# Patient Record
Sex: Male | Born: 1935 | Race: Black or African American | Hispanic: No | Smoking: Former smoker
Health system: Southern US, Community
[De-identification: ages and names within clinical notes are randomized; demographics above are authoritative.]

## PROBLEM LIST (undated history)

## (undated) DIAGNOSIS — I10 Essential (primary) hypertension: Secondary | ICD-10-CM

## (undated) DIAGNOSIS — M109 Gout, unspecified: Secondary | ICD-10-CM

---

## 2009-05-18 ENCOUNTER — Ambulatory Visit (HOSPITAL_COMMUNITY): Admission: RE | Admit: 2009-05-18 | Discharge: 2009-05-18 | Payer: Self-pay | Admitting: Pulmonary Disease

## 2013-05-16 ENCOUNTER — Inpatient Hospital Stay (HOSPITAL_COMMUNITY): Payer: Medicare Other

## 2013-05-16 ENCOUNTER — Emergency Department (HOSPITAL_COMMUNITY): Payer: Medicare Other

## 2013-05-16 ENCOUNTER — Inpatient Hospital Stay (HOSPITAL_COMMUNITY)
Admission: EM | Admit: 2013-05-16 | Discharge: 2013-05-21 | DRG: 683 | Disposition: A | Discharge status: Home / Self Care | Payer: Medicare Other | Attending: Internal Medicine | Admitting: Internal Medicine

## 2013-05-16 ENCOUNTER — Encounter (HOSPITAL_COMMUNITY): Payer: Self-pay | Admitting: *Deleted

## 2013-05-16 DIAGNOSIS — D61818 Other pancytopenia: Secondary | ICD-10-CM | POA: Diagnosis present

## 2013-05-16 DIAGNOSIS — N138 Other obstructive and reflux uropathy: Secondary | ICD-10-CM | POA: Diagnosis present

## 2013-05-16 DIAGNOSIS — M25571 Pain in right ankle and joints of right foot: Secondary | ICD-10-CM

## 2013-05-16 DIAGNOSIS — N185 Chronic kidney disease, stage 5: Secondary | ICD-10-CM | POA: Diagnosis present

## 2013-05-16 DIAGNOSIS — M7989 Other specified soft tissue disorders: Secondary | ICD-10-CM

## 2013-05-16 DIAGNOSIS — D649 Anemia, unspecified: Secondary | ICD-10-CM | POA: Diagnosis present

## 2013-05-16 DIAGNOSIS — M25572 Pain in left ankle and joints of left foot: Secondary | ICD-10-CM

## 2013-05-16 DIAGNOSIS — I10 Essential (primary) hypertension: Secondary | ICD-10-CM

## 2013-05-16 DIAGNOSIS — M109 Gout, unspecified: Secondary | ICD-10-CM

## 2013-05-16 DIAGNOSIS — E538 Deficiency of other specified B group vitamins: Secondary | ICD-10-CM | POA: Diagnosis present

## 2013-05-16 DIAGNOSIS — I824Y9 Acute embolism and thrombosis of unspecified deep veins of unspecified proximal lower extremity: Secondary | ICD-10-CM | POA: Diagnosis present

## 2013-05-16 DIAGNOSIS — R339 Retention of urine, unspecified: Secondary | ICD-10-CM

## 2013-05-16 DIAGNOSIS — N189 Chronic kidney disease, unspecified: Secondary | ICD-10-CM

## 2013-05-16 DIAGNOSIS — M25569 Pain in unspecified knee: Secondary | ICD-10-CM

## 2013-05-16 DIAGNOSIS — Z833 Family history of diabetes mellitus: Secondary | ICD-10-CM

## 2013-05-16 DIAGNOSIS — Z87891 Personal history of nicotine dependence: Secondary | ICD-10-CM

## 2013-05-16 DIAGNOSIS — N179 Acute kidney failure, unspecified: Secondary | ICD-10-CM

## 2013-05-16 DIAGNOSIS — M25579 Pain in unspecified ankle and joints of unspecified foot: Secondary | ICD-10-CM

## 2013-05-16 DIAGNOSIS — K5900 Constipation, unspecified: Secondary | ICD-10-CM | POA: Diagnosis present

## 2013-05-16 DIAGNOSIS — R630 Anorexia: Secondary | ICD-10-CM | POA: Diagnosis present

## 2013-05-16 DIAGNOSIS — N401 Enlarged prostate with lower urinary tract symptoms: Secondary | ICD-10-CM | POA: Diagnosis present

## 2013-05-16 DIAGNOSIS — E876 Hypokalemia: Secondary | ICD-10-CM | POA: Diagnosis present

## 2013-05-16 DIAGNOSIS — I120 Hypertensive chronic kidney disease with stage 5 chronic kidney disease or end stage renal disease: Secondary | ICD-10-CM | POA: Diagnosis present

## 2013-05-16 DIAGNOSIS — M25562 Pain in left knee: Secondary | ICD-10-CM

## 2013-05-16 DIAGNOSIS — I82409 Acute embolism and thrombosis of unspecified deep veins of unspecified lower extremity: Secondary | ICD-10-CM

## 2013-05-16 DIAGNOSIS — M25561 Pain in right knee: Secondary | ICD-10-CM

## 2013-05-16 HISTORY — DX: Gout, unspecified: M10.9

## 2013-05-16 HISTORY — DX: Essential (primary) hypertension: I10

## 2013-05-16 LAB — COMPREHENSIVE METABOLIC PANEL
ALT: 26 U/L (ref 0–53)
AST: 33 U/L (ref 0–37)
Albumin: 3.2 g/dL — ABNORMAL LOW (ref 3.5–5.2)
Alkaline Phosphatase: 60 U/L (ref 39–117)
BILIRUBIN TOTAL: 1 mg/dL (ref 0.3–1.2)
BUN: 86 mg/dL — AB (ref 6–23)
CHLORIDE: 96 meq/L (ref 96–112)
CO2: 18 meq/L — AB (ref 19–32)
Calcium: 9.7 mg/dL (ref 8.4–10.5)
Creatinine, Ser: 4.26 mg/dL — ABNORMAL HIGH (ref 0.50–1.35)
GFR calc Af Amer: 14 mL/min — ABNORMAL LOW (ref >90)
GFR, EST NON AFRICAN AMERICAN: 12 mL/min — AB (ref >90)
GLUCOSE: 98 mg/dL (ref 70–99)
Potassium: 4 mEq/L (ref 3.7–5.3)
Sodium: 134 mEq/L — ABNORMAL LOW (ref 137–147)
TOTAL PROTEIN: 7.6 g/dL (ref 6.0–8.3)

## 2013-05-16 LAB — TROPONIN I: Troponin I: <0.3 ng/mL (ref <0.30)

## 2013-05-16 LAB — ABO/RH: ABO/RH(D): A POS

## 2013-05-16 LAB — URINALYSIS, ROUTINE W REFLEX MICROSCOPIC
Bilirubin Urine: NEGATIVE
Glucose, UA: NEGATIVE mg/dL
HGB URINE DIPSTICK: NEGATIVE
Ketones, ur: NEGATIVE mg/dL
LEUKOCYTES UA: NEGATIVE
NITRITE: NEGATIVE
Protein, ur: 30 mg/dL — AB
SPECIFIC GRAVITY, URINE: 1.016 (ref 1.005–1.030)
Urobilinogen, UA: 0.2 mg/dL (ref 0.0–1.0)
pH: 5 (ref 5.0–8.0)

## 2013-05-16 LAB — CBC WITH DIFFERENTIAL/PLATELET
BASOS ABS: 0 10*3/uL (ref 0.0–0.1)
BASOS PCT: 0 % (ref 0–1)
EOS PCT: 2 % (ref 0–5)
Eosinophils Absolute: 0.1 10*3/uL (ref 0.0–0.7)
HCT: 15.5 % — ABNORMAL LOW (ref 39.0–52.0)
HEMOGLOBIN: 5.6 g/dL — AB (ref 13.0–17.0)
Lymphocytes Relative: 29 % (ref 12–46)
Lymphs Abs: 1 10*3/uL (ref 0.7–4.0)
MCH: 40 pg — ABNORMAL HIGH (ref 26.0–34.0)
MCHC: 36.1 g/dL — AB (ref 30.0–36.0)
MCV: 110.7 fL — ABNORMAL HIGH (ref 78.0–100.0)
MONOS PCT: 6 % (ref 3–12)
Monocytes Absolute: 0.2 10*3/uL (ref 0.1–1.0)
NEUTROS ABS: 2.1 10*3/uL (ref 1.7–7.7)
Neutrophils Relative %: 63 % (ref 43–77)
Platelets: 101 10*3/uL — ABNORMAL LOW (ref 150–400)
RBC: 1.4 MIL/uL — ABNORMAL LOW (ref 4.22–5.81)
RDW: 13.3 % (ref 11.5–15.5)
WBC: 3.4 10*3/uL — AB (ref 4.0–10.5)

## 2013-05-16 LAB — PROTIME-INR
INR: 1.31 (ref 0.00–1.49)
PROTHROMBIN TIME: 16 s — AB (ref 11.6–15.2)

## 2013-05-16 LAB — APTT: aPTT: 30 seconds (ref 24–37)

## 2013-05-16 LAB — PRO B NATRIURETIC PEPTIDE: Pro B Natriuretic peptide (BNP): 1290 pg/mL — ABNORMAL HIGH (ref 0–450)

## 2013-05-16 LAB — POC OCCULT BLOOD, ED: FECAL OCCULT BLD: NEGATIVE

## 2013-05-16 LAB — URINE MICROSCOPIC-ADD ON

## 2013-05-16 LAB — PREPARE RBC (CROSSMATCH)

## 2013-05-16 MED ORDER — ONDANSETRON HCL 4 MG/2ML IJ SOLN: 4.0000 mg | Freq: Four times a day (QID) | INTRAMUSCULAR | Status: DC | PRN

## 2013-05-16 MED ORDER — MORPHINE SULFATE 4 MG/ML IJ SOLN
4.0000 mg | Freq: Once | INTRAMUSCULAR | Status: AC
  Administered 2013-05-16: 4 mg via INTRAVENOUS
  Filled 2013-05-16: qty 1

## 2013-05-16 MED ORDER — SODIUM CHLORIDE 0.9 % IJ SOLN
3.0000 mL | Freq: Two times a day (BID) | INTRAMUSCULAR | Status: DC
  Administered 2013-05-16 – 2013-05-19 (×3): 3 mL via INTRAVENOUS

## 2013-05-16 MED ORDER — ONDANSETRON HCL 4 MG PO TABS: 4.0000 mg | ORAL_TABLET | Freq: Four times a day (QID) | ORAL | Status: DC | PRN

## 2013-05-16 MED ORDER — PREDNISONE 50 MG PO TABS
50.0000 mg | ORAL_TABLET | Freq: Every day | ORAL | Status: DC
  Administered 2013-05-16: 50 mg via ORAL
  Filled 2013-05-16 (×2): qty 1

## 2013-05-16 MED ORDER — OXYCODONE HCL 5 MG PO TABS
5.0000 mg | ORAL_TABLET | ORAL | Status: DC | PRN
  Administered 2013-05-17: 5 mg via ORAL
  Filled 2013-05-16: qty 1

## 2013-05-16 MED ORDER — HYDRALAZINE HCL 20 MG/ML IJ SOLN
10.0000 mg | INTRAMUSCULAR | Status: DC | PRN
Start: 2013-05-16 — End: 2013-05-21

## 2013-05-16 MED ORDER — SODIUM CHLORIDE 0.9 % IV SOLN
INTRAVENOUS | Status: DC
  Administered 2013-05-16: 22:00:00 via INTRAVENOUS

## 2013-05-16 MED ORDER — ACETAMINOPHEN 650 MG RE SUPP: 650.0000 mg | Freq: Four times a day (QID) | RECTAL | Status: DC | PRN

## 2013-05-16 MED ORDER — ACETAMINOPHEN 325 MG PO TABS
650.0000 mg | ORAL_TABLET | Freq: Four times a day (QID) | ORAL | Status: DC | PRN
  Administered 2013-05-19: 650 mg via ORAL
  Filled 2013-05-16: qty 2

## 2013-05-16 MED ORDER — TAMSULOSIN HCL 0.4 MG PO CAPS
0.4000 mg | ORAL_CAPSULE | Freq: Every day | ORAL | Status: DC
  Administered 2013-05-16 – 2013-05-20 (×5): 0.4 mg via ORAL
  Filled 2013-05-16 (×6): qty 1

## 2013-05-16 NOTE — H&P (Signed)
[facility] History and Physical  Patient: Caleb Hayes  [id number]  DOB: 1929/10/26  DOS: the patient was seen and examined on 05/16/2013 PCP: No primary provider on file.  Chief Complaint: Exertional fatigue  HPI: Caleb Hayes is a 80 y.o. male with Past medical history of hypertension, gout. The patient is coming from home. The patient was brought in by his daughters. The patient mentions that since last 2 weeks he has been having an episode of gout affecting bilateral knee and ankle and he has been having significant pain it due to that he has been inability to move or ambulate and since then he has been spending his time in his car. His daughters and granddaughters have been visiting him every now and then. Since last 2 weeks he also has decreased appetite and he has significant weight loss over last 6 months. He has been having exertional dyspnea since last one week progressively worsening. He denies any active bleeding or black color bowel movement. In fact he mentions he has been having constipation lasting for a week. Since last 2 days he has nausea and today he had an episode of vomiting without any blood. He complains of acid reflux and increased burping. He denies any nausea at this point. Denies any fever, chills, cough, chest pain, shortness of breath at rest, headache, syncope. He has history of hypertension and gout and he has not been taking any medication he categorically denies taking any alcohol, drugs, over the counter medications. He mentions he is able to empty his bladder completely  Review of Systems: as mentioned in the history of present illness.  A Comprehensive review of the other systems is negative.  Past Medical History  Diagnosis Date  . Hypertension   . Gout    History reviewed. No pertinent past surgical history. Social History:  reports that he has quit smoking. He does not have any smokeless tobacco history on file. He reports that he  drinks alcohol. He reports that he does not use illicit drugs. Independent for most of his  ADL.  No Known Allergies  No family history on file.  Prior to Admission medications   Not on File    Physical Exam: Filed Vitals:   05/16/13 1819 05/16/13 1904 05/16/13 1930 05/16/13 2030  BP: 131/53 110/72 123/54 141/113  Pulse: 87 85 82 108  Temp:      TempSrc:      Resp: 16 16 11 20   SpO2: 100%  100% 97%    General: Alert, Awake and Oriented to Time, Place and Person. Appear in mild distress Eyes: PERRL ENT: Oral Mucosa clear dry. Neck: No JVD Cardiovascular: S1 and S2 Present, no Murmur, Peripheral Pulses Present Respiratory: Bilateral Air entry equal and Decreased, Clear to Auscultation,  No Crackles, no wheezes Abdomen: Bowel Sound Present, Soft and Non tender Skin: No Rash Extremities: Bilateral Pedal edema, no calf tenderness Neurologic: Grossly Unremarkable.  Labs on Admission:  CBC:  Recent Labs Lab 05/16/13 1700  WBC 3.4*  NEUTROABS 2.1  HGB 5.6*  HCT 15.5*  MCV 110.7*  PLT 101*    CMP     Component Value Date/Time   NA 134* 05/16/2013 1700   K 4.0 05/16/2013 1700   CL 96 05/16/2013 1700   CO2 18* 05/16/2013 1700   GLUCOSE 98 05/16/2013 1700   BUN 86* 05/16/2013 1700   CREATININE 4.26* 05/16/2013 1700   CALCIUM 9.7 05/16/2013 1700   PROT 7.6 05/16/2013 1700   ALBUMIN  3.2* 05/16/2013 1700   AST 33 05/16/2013 1700   ALT 26 05/16/2013 1700   ALKPHOS 60 05/16/2013 1700   BILITOT 1.0 05/16/2013 1700   GFRNONAA 12* 05/16/2013 1700   GFRAA 14* 05/16/2013 1700    No results found for this basename: LIPASE, AMYLASE,  in the last 168 hours No results found for this basename: AMMONIA,  in the last 168 hours   Recent Labs Lab 05/16/13 1700  TROPONINI <0.30   BNP (last 3 results)  Recent Labs  05/16/13 1700  PROBNP 1290.0*    Radiological Exams on Admission: Dg Abd Acute W/chest  05/16/2013   CLINICAL DATA:  Intermittent umbilical pain.  Occasional emesis.  EXAM: ACUTE  ABDOMEN SERIES (ABDOMEN 2 VIEW & CHEST 1 VIEW)  COMPARISON:  None.  FINDINGS: Accounting for the low lung volumes and AP technique, there is borderline cardiomegaly. No edema.  No free intraperitoneal gas noted on the left-side-down lateral decubitus view. Several air-fluid levels are present in nondilated loops of small bowel. Gas and formed stool noted in the colon.  The L5 vertebra is partially sacralized.  IMPRESSION: 1. No dilated bowel. There is gas scattered in the small bowel with some air-fluid levels in small bowel, but not a necessarily abnormal number. Formed stool noted in the colon. Overall no specific bowel gas pattern abnormality is observed. 2. Borderline cardiomegaly.   Electronically Signed   By: Sherryl Barters M.D.   On: 05/16/2013 18:33    Assessment/Plan Principal Problem:   Symptomatic anemia Active Problems:   Acute-on-chronic kidney injury   Essential hypertension, benign   Gout   Urinary retention   Leg swelling   Poor appetite   Unspecified constipation   1. Symptomatic anemia The patient is presenting with complaints of exertional dyspnea and fatigue. He is not hypoxic at his baseline his chest x-ray does not show any significant abnormality. He appears to have significant anemia which appears to be secondary to chronic kidney disease as he denies any active bleeding and his hemoglobin is negative. He also does not have any coagulopathy  With this I would admit him to the hospital and that he would receive 2 units of PRBC. I would recheck his CBC after that. I would also obtain iron studies and hemolytic studies after the blood transfusion. Although his dyspnea does not show any sites of cystoscopy sites and he does not appear to have any significant coagulopathy.  2. Acute on chronic kidney injury Urinary retention Patient has 500 cc of urine in his bladder after an attempt to urinate. I will obtain a Foley catheter, ultrasound renal, start him on Flomax, start  him on hydralazine, I would hydrate him with IV fluids and repeat his BMP in the morning. He does not appear to have any significant Eugenia or volume overload. He may require further workup if he continues to have elevated serum creatinine  3. Gout Start him on prednisone  4. Bilateral leg swelling Likely secondary to chronic kidney disease and gout We get an x-ray of his ankle ultrasound of his lower extremity  5. Constipation MiraLAX and milk of magnesia  Consult: Nephrology in morning  DVT Prophylaxis:teds  Nutrition: Renal diet  Code Status: Full  Family Communication: Family was present at bedside, opportunity was given to ask question and all questions were answered satisfactorily at the time of interview. Disposition: Admitted to inpatient in telemetry unit.  Author: Berle Mull, MD [facility] Pager: [phone] 05/16/2013, 8:59 PM    If  7PM-7AM, please contact night-coverage [email or web address] Password [email or web address]

## 2013-05-16 NOTE — ED Notes (Signed)
Bladder Scanned the patient. Volume of 542 ml noted.

## 2013-05-16 NOTE — ED Notes (Signed)
Floor RN mad aware that blood is ready for pick-up;Consent form sent with patient to floor

## 2013-05-16 NOTE — ED Notes (Signed)
Pt stated he is unable to urinate.

## 2013-05-16 NOTE — ED Notes (Signed)
Transporting patient to new room assignment. 

## 2013-05-16 NOTE — ED Notes (Signed)
Admitting MD at bedside.

## 2013-05-16 NOTE — ED Notes (Signed)
542 ml seen on bladder scanner

## 2013-05-16 NOTE — ED Notes (Signed)
Pt arrived by [facility] with c/o bilateral leg and foot pain. Pt stated that pain has been going on x 25-30 years and he has flare up of pain. Hx of gout and HTN. Pt also has been living in car x 2 weeks.

## 2013-05-16 NOTE — ED Provider Notes (Signed)
TIME SEEN: 4:18 PM  CHIEF COMPLAINT: Bilateral knee and ankle pain, generalized weakness and fatigue  HPI: Patient is a 80 y.o. M with history of hypertension, gout who presents emergency department with several weeks of generalized weakness and fatigue, bilateral foot and ankle pain is similar to his prior episodes of gout. He denies any chest pain or shortness of breath, vomiting or diarrhea, bloody stools or melena, numbness or focal weakness. No fevers or chills. No cough. No history of injury to his legs. Family reports he has not seen a primary care physician in several years.  ROS: See HPI Constitutional: no fever  Eyes: no drainage  ENT: no runny nose   Cardiovascular:  no chest pain  Resp: no SOB  GI: no vomiting GU: no dysuria Integumentary: no rash  Allergy: no hives  Musculoskeletal: no leg swelling  Neurological: no slurred speech ROS otherwise negative  PAST MEDICAL HISTORY/PAST SURGICAL HISTORY:  Past Medical History  Diagnosis Date  . Hypertension   . Gout     MEDICATIONS:  Prior to Admission medications   Not on File    ALLERGIES:  Not on File  SOCIAL HISTORY:  History  Substance Use Topics  . Smoking status: Former Research scientist (life sciences)  . Smokeless tobacco: Not on file  . Alcohol Use: Yes     Comment: "every once in a while, not too regular"    FAMILY HISTORY: No family history on file.  EXAM: BP 140/115  Pulse 95  Temp(Src) 98 F (36.7 C) (Oral)  Resp 17  SpO2 93% CONSTITUTIONAL: Alert and oriented and responds appropriately to questions. Well-appearing; well-nourished, elderly, in no apparent distress HEAD: Normocephalic EYES: Conjunctivae clear, PERRL ENT: normal nose; no rhinorrhea; moist mucous membranes; pharynx without lesions noted NECK: Supple, no meningismus, no LAD  CARD: RRR; S1 and S2 appreciated; no murmurs, no clicks, no rubs, no gallops RESP: Normal chest excursion without splinting or tachypnea; breath sounds clear and equal  bilaterally; no wheezes, no rhonchi, no rales,  ABD/GI: Normal bowel sounds; non-distended; soft, non-tender, no rebound, no guarding RECTAL:  No gross blood or melena, normal rectal tone, patient has a stage I sacral decubitus ulcer does not appear infected with no induration or fluctuance or drainage BACK:  The back appears normal and is non-tender to palpation, there is no CVA tenderness EXT: Normal ROM in all joints; non-tender to palpation; no edema; normal capillary refill; no cyanosis; pain over bilateral knees and ankles with minimal warmth without erythema or joint effusion, full range of motion in all joints, no bony deformity  SKIN: Normal color for age and race; warm NEURO: Moves all extremities equally PSYCH: The patient's mood and manner are appropriate. Grooming and personal hygiene are appropriate.  MEDICAL DECISION MAKING: Patient here with generalized weakness and likely gout flare up in bilateral knees and ankles. No signs to suggest septic arthritis. He is hemodynamically stable. We'll obtain basic labs, urine.  ED PROGRESS: Patient's hemoglobin is 5.6. He is guaiac negative. Have discussed with family the patient will need transfusion. Patient has elevated creatinine, this may be chronic in the cause of his anemia. Patient is hemodynamically stable. His workup was otherwise unremarkable. Discussed with hospitalist for admission.    EKG Interpretation  Date/Time:  Sunday May 16 2013 16:07:40 EDT Ventricular Rate:  97 PR Interval:  215 QRS Duration: 75 QT Interval:  348 QTC Calculation: 442 R Axis:   65 Text Interpretation:  Sinus rhythm Ventricular premature complex Borderline prolonged PR interval Biatrial  enlargement Artifact No old tracing to compare Confirmed by WARD,  DO, KRISTEN [phone]) on 05/16/2013 4:18:07 PM          [place], DO 05/18/13 0000

## 2013-05-17 ENCOUNTER — Encounter (HOSPITAL_COMMUNITY): Payer: Self-pay

## 2013-05-17 ENCOUNTER — Inpatient Hospital Stay (HOSPITAL_COMMUNITY): Payer: Medicare Other

## 2013-05-17 DIAGNOSIS — D649 Anemia, unspecified: Secondary | ICD-10-CM

## 2013-05-17 DIAGNOSIS — R630 Anorexia: Secondary | ICD-10-CM | POA: Diagnosis present

## 2013-05-17 DIAGNOSIS — R339 Retention of urine, unspecified: Secondary | ICD-10-CM | POA: Diagnosis present

## 2013-05-17 DIAGNOSIS — N189 Chronic kidney disease, unspecified: Secondary | ICD-10-CM

## 2013-05-17 DIAGNOSIS — I10 Essential (primary) hypertension: Secondary | ICD-10-CM | POA: Diagnosis present

## 2013-05-17 DIAGNOSIS — M7989 Other specified soft tissue disorders: Secondary | ICD-10-CM | POA: Diagnosis present

## 2013-05-17 DIAGNOSIS — N179 Acute kidney failure, unspecified: Principal | ICD-10-CM

## 2013-05-17 DIAGNOSIS — K5900 Constipation, unspecified: Secondary | ICD-10-CM | POA: Diagnosis present

## 2013-05-17 DIAGNOSIS — M25579 Pain in unspecified ankle and joints of unspecified foot: Secondary | ICD-10-CM

## 2013-05-17 DIAGNOSIS — M109 Gout, unspecified: Secondary | ICD-10-CM | POA: Diagnosis present

## 2013-05-17 LAB — RAPID URINE DRUG SCREEN, HOSP PERFORMED
AMPHETAMINES: NOT DETECTED
BENZODIAZEPINES: NOT DETECTED
Barbiturates: NOT DETECTED
COCAINE: NOT DETECTED
Opiates: NOT DETECTED
TETRAHYDROCANNABINOL: NOT DETECTED

## 2013-05-17 LAB — COMPREHENSIVE METABOLIC PANEL
ALK PHOS: 54 U/L (ref 39–117)
ALT: 21 U/L (ref 0–53)
AST: 25 U/L (ref 0–37)
Albumin: 2.6 g/dL — ABNORMAL LOW (ref 3.5–5.2)
BUN: 89 mg/dL — ABNORMAL HIGH (ref 6–23)
CALCIUM: 9.2 mg/dL (ref 8.4–10.5)
CO2: 18 mEq/L — ABNORMAL LOW (ref 19–32)
Chloride: 103 mEq/L (ref 96–112)
Creatinine, Ser: 4.34 mg/dL — ABNORMAL HIGH (ref 0.50–1.35)
GFR calc Af Amer: 13 mL/min — ABNORMAL LOW (ref >90)
GFR, EST NON AFRICAN AMERICAN: 12 mL/min — AB (ref >90)
Glucose, Bld: 121 mg/dL — ABNORMAL HIGH (ref 70–99)
Potassium: 4.8 mEq/L (ref 3.7–5.3)
Sodium: 139 mEq/L (ref 137–147)
Total Bilirubin: 1.3 mg/dL — ABNORMAL HIGH (ref 0.3–1.2)
Total Protein: 6.6 g/dL (ref 6.0–8.3)

## 2013-05-17 LAB — FERRITIN: FERRITIN: 3619 ng/mL — AB (ref 22–322)

## 2013-05-17 LAB — IRON AND TIBC
IRON: 31 ug/dL — AB (ref 42–135)
Saturation Ratios: 23 % (ref 20–55)
TIBC: 135 ug/dL — ABNORMAL LOW (ref 215–435)
UIBC: 104 ug/dL — ABNORMAL LOW (ref 125–400)

## 2013-05-17 LAB — CBC WITH DIFFERENTIAL/PLATELET
Basophils Absolute: 0 10*3/uL (ref 0.0–0.1)
Basophils Relative: 0 % (ref 0–1)
Eosinophils Absolute: 0 10*3/uL (ref 0.0–0.7)
Eosinophils Relative: 0 % (ref 0–5)
HCT: 19.8 % — ABNORMAL LOW (ref 39.0–52.0)
Hemoglobin: 7.3 g/dL — ABNORMAL LOW (ref 13.0–17.0)
LYMPHS PCT: 23 % (ref 12–46)
Lymphs Abs: 0.9 10*3/uL (ref 0.7–4.0)
MCH: 36.1 pg — AB (ref 26.0–34.0)
MCHC: 36.9 g/dL — ABNORMAL HIGH (ref 30.0–36.0)
MCV: 98 fL (ref 78.0–100.0)
MONO ABS: 0.1 10*3/uL (ref 0.1–1.0)
MONOS PCT: 3 % (ref 3–12)
Neutro Abs: 2.7 10*3/uL (ref 1.7–7.7)
Neutrophils Relative %: 74 % (ref 43–77)
PLATELETS: 75 10*3/uL — AB (ref 150–400)
RBC: 2.02 MIL/uL — AB (ref 4.22–5.81)
RDW: 21.2 % — ABNORMAL HIGH (ref 11.5–15.5)
WBC: 3.7 10*3/uL — AB (ref 4.0–10.5)

## 2013-05-17 LAB — CBC
HCT: 20.5 % — ABNORMAL LOW (ref 39.0–52.0)
HCT: 23.4 % — ABNORMAL LOW (ref 39.0–52.0)
HEMOGLOBIN: 7.6 g/dL — AB (ref 13.0–17.0)
Hemoglobin: 8.6 g/dL — ABNORMAL LOW (ref 13.0–17.0)
MCH: 36.2 pg — ABNORMAL HIGH (ref 26.0–34.0)
MCH: 34.3 pg — AB (ref 26.0–34.0)
MCHC: 37.1 g/dL — ABNORMAL HIGH (ref 30.0–36.0)
MCHC: 36.8 g/dL — ABNORMAL HIGH (ref 30.0–36.0)
MCV: 97.6 fL (ref 78.0–100.0)
MCV: 93.2 fL (ref 78.0–100.0)
PLATELETS: 77 10*3/uL — AB (ref 150–400)
Platelets: 80 10*3/uL — ABNORMAL LOW (ref 150–400)
RBC: 2.1 MIL/uL — AB (ref 4.22–5.81)
RBC: 2.51 MIL/uL — AB (ref 4.22–5.81)
RDW: 21.9 % — ABNORMAL HIGH (ref 11.5–15.5)
RDW: 23.1 % — AB (ref 11.5–15.5)
WBC: 3.6 10*3/uL — ABNORMAL LOW (ref 4.0–10.5)
WBC: 3.5 10*3/uL — ABNORMAL LOW (ref 4.0–10.5)

## 2013-05-17 LAB — VITAMIN B12: VITAMIN B 12: 160 pg/mL — AB (ref 211–911)

## 2013-05-17 LAB — URIC ACID: Uric Acid, Serum: 8.4 mg/dL — ABNORMAL HIGH (ref 4.0–7.8)

## 2013-05-17 LAB — HEPATITIS B SURFACE ANTIGEN: Hepatitis B Surface Ag: NEGATIVE

## 2013-05-17 LAB — PREPARE RBC (CROSSMATCH)

## 2013-05-17 LAB — HAPTOGLOBIN: HAPTOGLOBIN: 195 mg/dL (ref 45–215)

## 2013-05-17 LAB — FOLATE: FOLATE: 14.4 ng/mL

## 2013-05-17 MED ORDER — ENSURE PUDDING PO PUDG
1.0000 | ORAL | Status: DC
  Administered 2013-05-18 – 2013-05-20 (×3): 1 via ORAL

## 2013-05-17 MED ORDER — CYANOCOBALAMIN 1000 MCG/ML IJ SOLN
100.0000 ug | Freq: Every day | INTRAMUSCULAR | Status: DC
  Filled 2013-05-17 (×2): qty 0.1

## 2013-05-17 MED ORDER — PNEUMOCOCCAL VAC POLYVALENT 25 MCG/0.5ML IJ INJ
0.5000 mL | INJECTION | INTRAMUSCULAR | Status: AC
  Administered 2013-05-18: 0.5 mL via INTRAMUSCULAR
  Filled 2013-05-17: qty 0.5

## 2013-05-17 MED ORDER — ADULT MULTIVITAMIN W/MINERALS CH
1.0000 | ORAL_TABLET | Freq: Every day | ORAL | Status: DC
  Administered 2013-05-17 – 2013-05-21 (×5): 1 via ORAL
  Filled 2013-05-17 (×5): qty 1

## 2013-05-17 MED ORDER — PANTOPRAZOLE SODIUM 40 MG IV SOLR
40.0000 mg | Freq: Two times a day (BID) | INTRAVENOUS | Status: DC
  Administered 2013-05-17 – 2013-05-19 (×6): 40 mg via INTRAVENOUS
  Filled 2013-05-17 (×7): qty 40

## 2013-05-17 MED ORDER — ONDANSETRON HCL 4 MG/2ML IJ SOLN: 4.0000 mg | Freq: Four times a day (QID) | INTRAMUSCULAR | Status: DC | PRN

## 2013-05-17 MED ORDER — INFLUENZA VAC SPLIT QUAD 0.5 ML IM SUSP
0.5000 mL | INTRAMUSCULAR | Status: DC
  Filled 2013-05-17: qty 0.5

## 2013-05-17 MED ORDER — SORBITOL 70 % SOLN
30.0000 mL | Freq: Every day | Status: DC
  Administered 2013-05-17 – 2013-05-21 (×4): 30 mL via ORAL
  Filled 2013-05-17 (×6): qty 30

## 2013-05-17 MED ORDER — COLCHICINE 0.6 MG PO TABS
0.6000 mg | ORAL_TABLET | Freq: Every day | ORAL | Status: DC
  Administered 2013-05-17 – 2013-05-19 (×3): 0.6 mg via ORAL
  Filled 2013-05-17 (×3): qty 1

## 2013-05-17 MED ORDER — FUROSEMIDE 80 MG PO TABS
160.0000 mg | ORAL_TABLET | Freq: Every day | ORAL | Status: AC
  Administered 2013-05-17: 160 mg via ORAL
  Filled 2013-05-17: qty 2

## 2013-05-17 MED ORDER — ENSURE COMPLETE PO LIQD
237.0000 mL | Freq: Two times a day (BID) | ORAL | Status: DC
  Administered 2013-05-17 – 2013-05-21 (×5): 237 mL via ORAL

## 2013-05-17 MED ORDER — BOOST / RESOURCE BREEZE PO LIQD
1.0000 | Freq: Two times a day (BID) | ORAL | Status: DC
  Administered 2013-05-17 – 2013-05-21 (×8): 1 via ORAL

## 2013-05-17 NOTE — Care Management Note (Addendum)
Page 2 of 3   05/21/2013     3:11:42 PM   CARE MANAGEMENT NOTE 05/21/2013  Patient:  Caleb Hayes, Caleb Hayes   Account Number:  [id number]  Date Initiated:  05/17/2013  Documentation initiated by:  Sanayah Munro  Subjective/Objective Assessment:   Admitted with HBGB 5.6/anemia     Action/Plan:   CM to follow for disposition needs   Anticipated DC Date:  05/20/2013   Anticipated DC Plan:  [place]  CM consult      [facility] Choice  HOME HEALTH   Choice offered to / List presented to:  C-4 Adult Children   DME arranged  [facility] [place]      DME agency  [place] arranged  [place]      [place].   Status of service:  Completed, signed off Medicare Important Message given?   (If response is "NO", the following Medicare IM given date fields will be blank) Date Medicare IM given:   Date Additional Medicare IM given:    Discharge Disposition:  [place]  Per UR Regulation:  Reviewed for med. necessity/level of care/duration of stay  If discussed at [place] of Stay Meetings, dates discussed:    Comments:  05/21/2013 Disposition Plan:  Home with HHS:  RN, PT Special RN Instructions for INR on Monday 05/24/13 D/c home on Lovenox injections. [place]  [phone] [facility] labs Wednesday 05/26/2013 10:00 Virgie Dad. [place], [place] at [facility]. [place], [place]: [phone] Appt:  06/18/13 11:00 (Anemia MGMT)  Lovenox/Generic per rep at [employer]: lovenox name brand is NOT COVERED Generic: tier 2, if patient uses [facility], [facility], [facility], [facility], or some of the mom and pop pharmacies, they will have a $6 co-pay all other pharmacies their co-pay will be [phone] * [facility] [address], [place], [place] [address] [phone] (CM called for  availability - NONE)  [facility], [place], [place] [address] [phone] (CM called for availabilty - yes.  CM instructed and provided PCG with updated information and printed pharmacy contact infromation.  CM instructed to take [place] card to pharmacy at time of pick up d/t none on file.  DTR states this is because patient has never been sick or required any meds until now. Caleb Huard RN, BSN, MSHL, CCM 05/21/2013   ---05/21/2013 [phone] by Mariann Laster--- Benefits check Please check co-pay and auth and deductibles for Lovenox 70mg  injections qd x 5 days d/c scheduled for today. Thanks, [employer] [phone] RN, BSN, MSHL, CCM 05/21/2013  05/20/2013 Adm with anemia; admitted with HBg 5.6. 3 units PRBC given. hx/o refusing HD. Nephrology consult completed and signed off Anemic current HCT 23.8  following 3 Units; Urinary Retention - [place] Social:  From home with family; DTRs requesting no visitors. Home DME:  Caleb Hayes to right leg (refused Hospital Bed) Elects HHS Provider:  [facility] Dispostion Plan: HHS:  PT [facility] / Butch Penny  notified) DME:  [facility] / Caleb Hayes notified) ADD: +1 Caleb Somoza, RN, BSN, MSHL, CCM 05/20/2013   05/17/2013 Hx/o anemia; admitted with HBg 5.6. 2 units PRBC given. Nephrology consult pending Social:  From home with family; DTRs requesting no visitors. Disposition Plan pending Caleb Rosenberg,  RN, BSN, [place], CCM 05/17/2013

## 2013-05-17 NOTE — Progress Notes (Signed)
Pt vomited brown emesis with food particles, pt does not feel nauseated, says it's from being shook up.  MD text/paged will continue to monitor.  Pt sleeping.

## 2013-05-17 NOTE — Progress Notes (Signed)
[facility] PROGRESS NOTE  Caleb Hayes [id number] DOB: Feb 16, 1930 DOA: 05/16/2013 PCP: No primary provider on file.  Assessment/Plan: 1-Acute on chronic renal failure. Patient with pancytopenia, concern for MM. Continue with IV fluids. Renal US ordered. Renal consulted.   2-Pancytopenia; Anemia. Has received 2 units of PRBC. Will give another unit. B 12 low at 160. Will order B 12 supplement.  No significant elevation of bilirubin. Haptoglobin normal at 195.  -Will order LDH.  -UPEP, SPEP ordered. -iron supplement.   3-B-12 deficiency; start B12 100 mcg daily for 7 days, need further doses after that.   4-History of gout; bilateral ankle pain, swelling : start colchicine. Doppler ordered.  5-Constipation; start sorbitol.  6-Right Medial malleolar fracture;  is age indeterminate; ortho consulted.    Code Status: Full Code.  Family Communication: care discussed with daughter who was at bedside.  Disposition Plan: remain inpatient.    Consultants:  Nephrologist  Orthopedic.   Procedures:  US renal;   Antibiotics:  none  HPI/Subjective: Lying in bed. No BM for last week. LE pain for last month. Unable to walk for last month.  Daughter relates his father was told almost one year ago he has kidney problems.   Objective: Filed Vitals:   05/17/13 0714  BP: 133/65  Pulse: 61  Temp: 96.7 F (35.9 C)  Resp: 18    Intake/Output Summary (Last 24 hours) at 05/17/13 1230 Last data filed at 05/17/13 1000  Gross per 24 hour  Intake 1026.67 ml  Output   1300 ml  Net -273.33 ml   Filed Weights   05/17/13 0442  Weight: 67.903 kg (149 lb 11.2 oz)    Exam:   General:  No distress.   Cardiovascular: S 1, S 2 RRR  Respiratory: decreases breath sound, crackles bases.   Abdomen: BS decreases, mild distended, no rigidity.   Musculoskeletal: Trace edema.   Data Reviewed: Basic Metabolic Panel:  Recent Labs Lab 05/16/13 1700 05/17/13 1030  NA 134*  139  K 4.0 4.8  CL 96 103  CO2 18* 18*  GLUCOSE 98 121*  BUN 86* 89*  CREATININE 4.26* 4.34*  CALCIUM 9.7 9.2   Liver Function Tests:  Recent Labs Lab 05/16/13 1700 05/17/13 1030  AST 33 25  ALT 26 21  ALKPHOS 60 54  BILITOT 1.0 1.3*  PROT 7.6 6.6  ALBUMIN 3.2* 2.6*   No results found for this basename: LIPASE, AMYLASE,  in the last 168 hours No results found for this basename: AMMONIA,  in the last 168 hours CBC:  Recent Labs Lab 05/16/13 1700 05/17/13 1030  WBC 3.4* 3.7*  NEUTROABS 2.1 PENDING  HGB 5.6* 7.3*  HCT 15.5* 19.8*  MCV 110.7* 98.0  PLT 101* PENDING   Cardiac Enzymes:  Recent Labs Lab 05/16/13 1700  TROPONINI <0.30   BNP (last 3 results)  Recent Labs  05/16/13 1700  PROBNP 1290.0*   CBG: No results found for this basename: GLUCAP,  in the last 168 hours  No results found for this or any previous visit (from the past 240 hour(s)).   Studies: Dg Ankle Left Port  05/17/2013   CLINICAL DATA:  History of gout.  Swelling and pain.  EXAM: PORTABLE LEFT ANKLE - 2 VIEW  COMPARISON:  None.  FINDINGS: Diffuse soft tissue swelling. There is a tiny erosion along the inferior aspect of the medial malleolus. No evidence of an acute fracture. Calcaneal spur.  IMPRESSION: Tiny osseous erosion along the medial malleolus is  in keeping with the given history of gout. Diffuse soft tissue swelling.   Electronically Signed   By: Lorin Picket M.D.   On: 05/17/2013 08:13   Dg Ankle Right Port  05/17/2013   CLINICAL DATA:  Swelling and pain.  History of gout.  EXAM: PORTABLE RIGHT ANKLE - 2 VIEW  COMPARISON:  None.  FINDINGS: There is a minimally displaced and transversely oriented fracture of the medial malleolus, indeterminate in age. Diffuse soft tissue swelling. Talar dome appears grossly intact. Degenerative changes are seen in the midfoot. Calcaneal spurs.  IMPRESSION: Medial malleolar fracture is age indeterminate. Please correlate clinically. Diffuse soft  tissue swelling.   Electronically Signed   By: Lorin Picket M.D.   On: 05/17/2013 08:11   Dg Abd Acute W/chest  05/16/2013   CLINICAL DATA:  Intermittent umbilical pain.  Occasional emesis.  EXAM: ACUTE ABDOMEN SERIES (ABDOMEN 2 VIEW & CHEST 1 VIEW)  COMPARISON:  None.  FINDINGS: Accounting for the low lung volumes and AP technique, there is borderline cardiomegaly. No edema.  No free intraperitoneal gas noted on the left-side-down lateral decubitus view. Several air-fluid levels are present in nondilated loops of small bowel. Gas and formed stool noted in the colon.  The L5 vertebra is partially sacralized.  IMPRESSION: 1. No dilated bowel. There is gas scattered in the small bowel with some air-fluid levels in small bowel, but not a necessarily abnormal number. Formed stool noted in the colon. Overall no specific bowel gas pattern abnormality is observed. 2. Borderline cardiomegaly.   Electronically Signed   By: Sherryl Barters M.D.   On: 05/16/2013 18:33    Scheduled Meds: . feeding supplement (ENSURE COMPLETE)  237 mL Oral BID BM  . feeding supplement (ENSURE)  1 Container Oral Q24H  . feeding supplement (RESOURCE BREEZE)  1 Container Oral BID WC  . [START ON 05/18/2013] influenza vac split quadrivalent PF  0.5 mL Intramuscular Tomorrow-1000  . multivitamin with minerals  1 tablet Oral Daily  . pantoprazole (PROTONIX) IV  40 mg Intravenous Q12H  . [START ON 05/18/2013] pneumococcal 23 valent vaccine  0.5 mL Intramuscular Tomorrow-1000  . predniSONE  50 mg Oral Q breakfast  . sodium chloride  3 mL Intravenous Q12H  . tamsulosin  0.4 mg Oral QPC supper   Continuous Infusions: . sodium chloride 100 mL/hr at 05/16/13 2138    Principal Problem:   Symptomatic anemia Active Problems:   Acute-on-chronic kidney injury   Essential hypertension, benign   Gout   Urinary retention   Leg swelling   Poor appetite   Unspecified constipation    Time spent: 35 minutes.     Kimber Relic  [facility] Pager [phone]. If 7PM-7AM, please contact night-coverage at [email or web address], password [facility] 05/17/2013, 12:30 PM  LOS: 1 day

## 2013-05-17 NOTE — Progress Notes (Signed)
Verified with MD that only one unit of PRBC's was to be given.  Will continue to monitor.
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
## 2013-05-17 NOTE — Progress Notes (Signed)
INITIAL NUTRITION ASSESSMENT  DOCUMENTATION CODES Per approved criteria  -Not Applicable   INTERVENTION: Provide Resource Breeze BID with meals Provide Ensure Complete BID in between meals Provide Ensure Pudding once daily Provide Snack once daily Provide Multivitamin with minerals daily  NUTRITION DIAGNOSIS: Inadequate oral intake related to poor appetite as evidenced by 15% weight loss in less than 3 months per family report.   Goal: Pt to meet >/= 90% of their estimated nutrition needs   Monitor:  PO intake, weight trend, labs  Reason for Assessment: Malnutrition Screening Tool, score of 2  80 y.o. male  Admitting Dx: Symptomatic anemia  ASSESSMENT: 80 y.o. male with Past medical history of hypertension, gout. The patient was brought in by his daughters. The patient mentions that since last 2 weeks he has been having an episode of gout affecting bilateral knee and ankle and he has been having significant pain. Since last 2 weeks he also has decreased appetite and he has significant weight loss over last 6 months. He has been having exertional dyspnea since last one week progressively worsening.  Pt asleep at time of visit. Per daughter at bedside, pt has not been eating well since Christmas at which time pt weighed 175 to 180 lbs. Per daughter, pt has mostly been drinking juice and soda, he has been eating very little. Per daughter pt was drinking Ensure supplements once daily for a while. No reports of chewing or swallowing problems. Per nursing notes, pt ate 0% of breakfast this morning. Unable to perform physical exam at time of visit, will attempt at follow-up when pt is more awake.   Height: Ht Readings from Last 1 Encounters:  05/17/13 5\' 9"  (1.753 m)    Weight: Wt Readings from Last 1 Encounters:  05/17/13 149 lb 11.2 oz (67.903 kg)    Ideal Body Weight: 160 lbs  % Ideal Body Weight: 93%  Wt Readings from Last 10 Encounters:  05/17/13 149 lb 11.2 oz (67.903  kg)    Usual Body Weight: 175-180 lbs  % Usual Body Weight: 85%  BMI:  Body mass index is 22.1 kg/(m^2).  Estimated Nutritional Needs: Kcal: 1600-1800 Protein: 80-90 grams Fluid: 1.6-1.8 L/day  Skin: stage 1 pressure ulcer on buttocls  Diet Order: Cardiac  EDUCATION NEEDS: -No education needs identified at this time   Intake/Output Summary (Last 24 hours) at 05/17/13 1045 Last data filed at 05/17/13 0813  Gross per 24 hour  Intake 806.67 ml  Output    900 ml  Net -93.33 ml    Last BM: WDL  Labs:   Recent Labs Lab 05/16/13 1700  NA 134*  K 4.0  CL 96  CO2 18*  BUN 86*  CREATININE 4.26*  CALCIUM 9.7  GLUCOSE 98    CBG (last 3)  No results found for this basename: GLUCAP,  in the last 72 hours  Scheduled Meds: . [START ON 05/18/2013] influenza vac split quadrivalent PF  0.5 mL Intramuscular Tomorrow-1000  . pantoprazole (PROTONIX) IV  40 mg Intravenous Q12H  . [START ON 05/18/2013] pneumococcal 23 valent vaccine  0.5 mL Intramuscular Tomorrow-1000  . predniSONE  50 mg Oral Q breakfast  . sodium chloride  3 mL Intravenous Q12H  . tamsulosin  0.4 mg Oral QPC supper    Continuous Infusions: . sodium chloride 100 mL/hr at 05/16/13 2138    Past Medical History  Diagnosis Date  . Hypertension   . Gout     History reviewed. No pertinent past surgical history.  Neri Samek Barnett RD, LDN Inpatient Clinical Dietitian Pager: [phone] After Hours Pager: [phone]  

## 2013-05-17 NOTE — Consult Note (Signed)
Caleb Hayes is an 80 y.o. 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  In Er found to have Hg 5.6 and Scr 4.26.  Has hx of HTN but has not taken any meds for years.  He has not seen a doctor since around 2011 when he had cataracts removed.  His daughter states that he received a letter from "some doctor" stating that his kidney fx was not normal and he needed FU, which he did not do.  No hx gross hematuria, no kidney stones.  Takes 4-6 aleve/month for pain.  Took 2-3 a day before admission.  Past Medical History  Diagnosis Date  . Hypertension   . Gout   SP cataract removal   History reviewed. No pertinent past surgical history.  History reviewed. No pertinent family history. Social History:  reports that he has quit smoking. He does not have any smokeless tobacco history on file. He reports that he drinks alcohol. He reports that he does not use illicit drugs. Daughter with ESRD ? Etiology on HD in [place].  + DM in father. Lives with his daughter.  Non smoker, nondrinker Allergies: No Known Allergies  No prescriptions prior to admission     Lab Results: UA: 30 protein   Recent Labs  05/16/13 1700 05/17/13 1030 05/17/13 1242  WBC 3.4* 3.7* 3.6*  HGB 5.6* 7.3* 7.6*  HCT 15.5* 19.8* 20.5*  PLT 101* 75* 80*   BMET  Recent Labs  05/16/13 1700 05/17/13 1030  NA 134* 139  K 4.0 4.8  CL 96 103  CO2 18* 18*  GLUCOSE 98 121*  BUN 86* 89*  CREATININE 4.26* 4.34*  CALCIUM 9.7 9.2   LFT  Recent Labs  05/17/13 1030  PROT 6.6  ALBUMIN 2.6*  AST 25  ALT 21  ALKPHOS 54  BILITOT 1.3*   Dg Ankle Left Port  05/17/2013   CLINICAL DATA:  History of gout.  Swelling and pain.  EXAM: PORTABLE LEFT ANKLE - 2 VIEW  COMPARISON:  None.  FINDINGS: Diffuse soft tissue swelling. There is a tiny erosion along the inferior aspect of the medial malleolus. No evidence of an acute fracture. Calcaneal spur.   IMPRESSION: Tiny osseous erosion along the medial malleolus is in keeping with the given history of gout. Diffuse soft tissue swelling.   Electronically Signed   By: Lorin Picket M.D.   On: 05/17/2013 08:13   Dg Ankle Right Port  05/17/2013   CLINICAL DATA:  Swelling and pain.  History of gout.  EXAM: PORTABLE RIGHT ANKLE - 2 VIEW  COMPARISON:  None.  FINDINGS: There is a minimally displaced and transversely oriented fracture of the medial malleolus, indeterminate in age. Diffuse soft tissue swelling. Talar dome appears grossly intact. Degenerative changes are seen in the midfoot. Calcaneal spurs.  IMPRESSION: Medial malleolar fracture is age indeterminate. Please correlate clinically. Diffuse soft tissue swelling.   Electronically Signed   By: Lorin Picket M.D.   On: 05/17/2013 08:11   Dg Abd Acute W/chest  05/16/2013   CLINICAL DATA:  Intermittent umbilical pain.  Occasional emesis.  EXAM: ACUTE ABDOMEN SERIES (ABDOMEN 2 VIEW & CHEST 1 VIEW)  COMPARISON:  None.  FINDINGS: Accounting for the low lung volumes and AP technique, there is borderline cardiomegaly. No edema.  No free intraperitoneal gas noted on the left-side-down lateral decubitus view. Several air-fluid levels are present in nondilated loops  of small bowel. Gas and formed stool noted in the colon.  The L5 vertebra is partially sacralized.  IMPRESSION: 1. No dilated bowel. There is gas scattered in the small bowel with some air-fluid levels in small bowel, but not a necessarily abnormal number. Formed stool noted in the colon. Overall no specific bowel gas pattern abnormality is observed. 2. Borderline cardiomegaly.   Electronically Signed   By: Sherryl Barters M.D.   On: 05/16/2013 18:33    ROS: Appetite poor for last few weeks No N/V + DOE No CP No abd pain.  No change in bowels.  No melena or hematochezia Chronic pain, intermittent  in toes and fingers No back pain No neuropathic sxs Noticed edema about 1 month ago  PHYSICAL  EXAM: Blood pressure 122/59, pulse 74, temperature 97.1 F (36.2 C), temperature source Oral, resp. rate 18, height 5\' 9"  (1.753 m), weight 67.903 kg (149 lb 11.2 oz), SpO2 99.00%. HEENT: PERRLA EOMI NECK:No JVD LUNGS:clear CARDIAC:RRR with 2/6 systolic M LSB  No rub ABD:+ BS NTND no HSM EXT:2+ edema NEURO:CNI Ox3 No asterixis  Assessment: 1. 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 Given severe anemia will RO dysproteinemia 2. Anemia 3. Hx HTN PLAN: 1 Renal US 2. SPEP and free light chains 3. Daughter will try to get the letter he was sent few years ago 5. Check PTH, check PO4 5. Give dose lasix PO since getting blood to help prevent hyperkalemia 6. Renal diet 7. Daily Scr 8. I would put him on colchicine rather that prednisone for his "gout"  Check uric acid level   Zinnia Tindall T 05/17/2013, 2:19 PM

## 2013-05-17 NOTE — Progress Notes (Signed)
Orthopedic Tech Progress Note Patient Details:  Caleb Hayes August 23, 1929 [id number] Ankle ASO applied as ordered. Application tolerated well.  Ortho Devices Type of Ortho Device: ASO Ortho Device/Splint Location: Right LE Ortho Device/Splint Interventions: Application   Asia R Thompson 05/17/2013, 5:09 PM

## 2013-05-17 NOTE — Progress Notes (Signed)
UR completed Caleb Moomaw K. Omran Keelin, RN, BSN, Caleb Hayes, CCM  05/17/2013 8:55 AM

## 2013-05-18 DIAGNOSIS — M7989 Other specified soft tissue disorders: Secondary | ICD-10-CM

## 2013-05-18 DIAGNOSIS — N184 Chronic kidney disease, stage 4 (severe): Secondary | ICD-10-CM

## 2013-05-18 LAB — CBC
HEMATOCRIT: 23.4 % — AB (ref 39.0–52.0)
Hemoglobin: 8.3 g/dL — ABNORMAL LOW (ref 13.0–17.0)
MCH: 33.2 pg (ref 26.0–34.0)
MCHC: 35.5 g/dL (ref 30.0–36.0)
MCV: 93.6 fL (ref 78.0–100.0)
Platelets: 66 10*3/uL — ABNORMAL LOW (ref 150–400)
RBC: 2.5 MIL/uL — ABNORMAL LOW (ref 4.22–5.81)
RDW: 23.7 % — AB (ref 11.5–15.5)
WBC: 3.2 10*3/uL — AB (ref 4.0–10.5)

## 2013-05-18 LAB — PROTEIN / CREATININE RATIO, URINE
Creatinine, Urine: 76.41 mg/dL
Protein Creatinine Ratio: 0.36 — ABNORMAL HIGH (ref 0.00–0.15)
TOTAL PROTEIN, URINE: 27.7 mg/dL

## 2013-05-18 LAB — TYPE AND SCREEN
ABO/RH(D): A POS
Antibody Screen: NEGATIVE
UNIT DIVISION: 0
UNIT DIVISION: 0
Unit division: 0

## 2013-05-18 LAB — RENAL FUNCTION PANEL
Albumin: 2.4 g/dL — ABNORMAL LOW (ref 3.5–5.2)
BUN: 85 mg/dL — ABNORMAL HIGH (ref 6–23)
CHLORIDE: 105 meq/L (ref 96–112)
CO2: 18 mEq/L — ABNORMAL LOW (ref 19–32)
Calcium: 8.8 mg/dL (ref 8.4–10.5)
Creatinine, Ser: 4.09 mg/dL — ABNORMAL HIGH (ref 0.50–1.35)
GFR calc Af Amer: 14 mL/min — ABNORMAL LOW (ref >90)
GFR, EST NON AFRICAN AMERICAN: 12 mL/min — AB (ref >90)
Glucose, Bld: 174 mg/dL — ABNORMAL HIGH (ref 70–99)
POTASSIUM: 4.8 meq/L (ref 3.7–5.3)
Phosphorus: 4.5 mg/dL (ref 2.3–4.6)
Sodium: 138 mEq/L (ref 137–147)

## 2013-05-18 LAB — RETICULOCYTES
RBC.: 2.55 MIL/uL — ABNORMAL LOW (ref 4.22–5.81)
RETIC CT PCT: 0.8 % (ref 0.4–3.1)
Retic Count, Absolute: 20.4 10*3/uL (ref 19.0–186.0)

## 2013-05-18 LAB — PARATHYROID HORMONE, INTACT (NO CA): PTH: 78.7 pg/mL — ABNORMAL HIGH (ref 14.0–72.0)

## 2013-05-18 LAB — HEPARIN LEVEL (UNFRACTIONATED): HEPARIN UNFRACTIONATED: 0.23 [IU]/mL — AB (ref 0.30–0.70)

## 2013-05-18 LAB — LACTATE DEHYDROGENASE: LDH: 403 U/L — ABNORMAL HIGH (ref 94–250)

## 2013-05-18 MED ORDER — SENNOSIDES-DOCUSATE SODIUM 8.6-50 MG PO TABS
2.0000 | ORAL_TABLET | Freq: Two times a day (BID) | ORAL | Status: DC
  Administered 2013-05-18 – 2013-05-21 (×7): 2 via ORAL
  Filled 2013-05-18 (×8): qty 2

## 2013-05-18 MED ORDER — HEPARIN BOLUS VIA INFUSION
3000.0000 [IU] | Freq: Once | INTRAVENOUS | Status: DC
  Filled 2013-05-18: qty 3000

## 2013-05-18 MED ORDER — SODIUM BICARBONATE 650 MG PO TABS
650.0000 mg | ORAL_TABLET | Freq: Two times a day (BID) | ORAL | Status: DC
  Administered 2013-05-18 – 2013-05-21 (×7): 650 mg via ORAL
  Filled 2013-05-18 (×8): qty 1

## 2013-05-18 MED ORDER — CYANOCOBALAMIN 1000 MCG/ML IJ SOLN
1000.0000 ug | Freq: Every day | INTRAMUSCULAR | Status: DC
  Administered 2013-05-18 – 2013-05-21 (×4): 1000 ug via INTRAMUSCULAR
  Filled 2013-05-18 (×4): qty 1

## 2013-05-18 MED ORDER — BISACODYL 5 MG PO TBEC
5.0000 mg | DELAYED_RELEASE_TABLET | Freq: Once | ORAL | Status: AC
  Administered 2013-05-18: 5 mg via ORAL
  Filled 2013-05-18: qty 1

## 2013-05-18 MED ORDER — HEPARIN (PORCINE) IN NACL 100-0.45 UNIT/ML-% IJ SOLN
1150.0000 [IU]/h | INTRAMUSCULAR | Status: DC
  Administered 2013-05-18: 950 [IU]/h via INTRAVENOUS
  Administered 2013-05-19: 1150 [IU]/h via INTRAVENOUS
  Filled 2013-05-18 (×2): qty 250

## 2013-05-18 NOTE — Progress Notes (Signed)
Daughter, Apolonio Schneiders, in room with patient at this time. Family aware that Dr. Mercy Moore would like family to see dialysis video. Patient states that he would like to wait to see video when more family can watch it. Will play video once rest of family is in  Room.

## 2013-05-18 NOTE — Progress Notes (Signed)
[facility] PROGRESS NOTE  Caleb Hayes [id number] DOB: 12/22/1929 DOA: 05/16/2013 PCP: No primary provider on file.  Assessment/Plan: 1-Acute on chronic renal failure. Patient with pancytopenia, concern for MM.  Renal US no evidence of obstruction.  Renal consulted. Vascular consulted for HD access for the future.   2-Pancytopenia; Anemia. Has received 3 units of PRBC.  B 12 low at 160.  No significant elevation of bilirubin. Haptoglobin normal at 195.  -LDH pending.  -UPEP, SPEP pending.  -iron supplement.  -B-12 supplement.  -Concern for MM.  -Now with DVT, Hematology consulted.   3-B-12 deficiency; B-12 1000 mcg daily for 7 days.  need further doses after that. Intrinsic factor antibody ordered. Monitor K level.   4-History of gout; bilateral ankle pain, swelling : continue with  colchicine. 5-Constipation; start sorbitol. Will order enema. Will ordered tap water enema.  6-Right Medial malleolar fracture;  is age indeterminate; ortho consulted. Ortho think patient has right ankle with gouty arthropathy with possible old trauma (nondisplaced fracture) of the medial malleolus. Plan: ASO to the right ankle. PT for ambulation WBAT Bilateral LEs. ROM,stretching and strengthening to the ankles bilateral. 7-Bilateral LE DVT; patient with renal failure, thrombocytopenia. Will start heparin Gtt. Monitor platelet. Hematology consulted.     Code Status: Full Code.  Family Communication: care discussed with daughter who was at bedside.  Disposition Plan: remain inpatient.    Consultants:  Nephrologist  Orthopedic.   Procedures:  US renal;   Antibiotics:  none  HPI/Subjective: No BM. No more vomiting. Tolerating clear die.   Objective: Filed Vitals:   05/18/13 1003  BP: 121/68  Pulse: 72  Temp:   Resp:     Intake/Output Summary (Last 24 hours) at 05/18/13 1312 Last data filed at 05/18/13 1202  Gross per 24 hour  Intake 3108.33 ml  Output   1985 ml  Net  1123.33 ml   Filed Weights   05/17/13 0442 05/18/13 0518  Weight: 67.903 kg (149 lb 11.2 oz) 68.675 kg (151 lb 6.4 oz)    Exam:   General:  No distress.   Cardiovascular: S 1, S 2 RRR  Respiratory: decreases breath sound, crackles bases.   Abdomen: BS decreases, mild distended, no rigidity.   Musculoskeletal: BL LE edema.   Data Reviewed: Basic Metabolic Panel:  Recent Labs Lab 05/16/13 1700 05/17/13 1030 05/18/13 0440  NA 134* 139 138  K 4.0 4.8 4.8  CL 96 103 105  CO2 18* 18* 18*  GLUCOSE 98 121* 174*  BUN 86* 89* 85*  CREATININE 4.26* 4.34* 4.09*  CALCIUM 9.7 9.2 8.8  PHOS  --   --  4.5   Liver Function Tests:  Recent Labs Lab 05/16/13 1700 05/17/13 1030 05/18/13 0440  AST 33 25  --   ALT 26 21  --   ALKPHOS 60 54  --   BILITOT 1.0 1.3*  --   PROT 7.6 6.6  --   ALBUMIN 3.2* 2.6* 2.4*   No results found for this basename: LIPASE, AMYLASE,  in the last 168 hours No results found for this basename: AMMONIA,  in the last 168 hours CBC:  Recent Labs Lab 05/16/13 1700 05/17/13 1030 05/17/13 1242 05/17/13 1937 05/18/13 0440  WBC 3.4* 3.7* 3.6* 3.5* 3.2*  NEUTROABS 2.1 2.7  --   --   --   HGB 5.6* 7.3* 7.6* 8.6* 8.3*  HCT 15.5* 19.8* 20.5* 23.4* 23.4*  MCV 110.7* 98.0 97.6 93.2 93.6  PLT 101* 75* 80* 77*  66*   Cardiac Enzymes:  Recent Labs Lab 05/16/13 1700  TROPONINI <0.30   BNP (last 3 results)  Recent Labs  05/16/13 1700  PROBNP 1290.0*   CBG: No results found for this basename: GLUCAP,  in the last 168 hours  No results found for this or any previous visit (from the past 240 hour(s)).   Studies: US Renal  05/17/2013   CLINICAL DATA:  Left flank pain.  Renal failure.  EXAM: RENAL/URINARY TRACT ULTRASOUND COMPLETE  COMPARISON:  Ultrasound dated 05/18/2009  FINDINGS: Right Kidney:  Length: 9.3 cm. Diffuse increased echogenicity with several small calcifications in the upper pole. No hydronephrosis.  Left Kidney:  Length: 9.8 cm.  Diffused increased echogenicity. No hydronephrosis.  Bladder:  The bladder is empty with a Foley catheter in place.  IMPRESSION: Echogenic renal parenchyma bilaterally consistent with renal medical disease, essentially unchanged since 05/18/2009. No obstruction.   Electronically Signed   By: Rozetta Nunnery M.D.   On: 05/17/2013 22:48   Dg Ankle Left Port  05/17/2013   CLINICAL DATA:  History of gout.  Swelling and pain.  EXAM: PORTABLE LEFT ANKLE - 2 VIEW  COMPARISON:  None.  FINDINGS: Diffuse soft tissue swelling. There is a tiny erosion along the inferior aspect of the medial malleolus. No evidence of an acute fracture. Calcaneal spur.  IMPRESSION: Tiny osseous erosion along the medial malleolus is in keeping with the given history of gout. Diffuse soft tissue swelling.   Electronically Signed   By: Lorin Picket M.D.   On: 05/17/2013 08:13   Dg Ankle Right Port  05/17/2013   CLINICAL DATA:  Swelling and pain.  History of gout.  EXAM: PORTABLE RIGHT ANKLE - 2 VIEW  COMPARISON:  None.  FINDINGS: There is a minimally displaced and transversely oriented fracture of the medial malleolus, indeterminate in age. Diffuse soft tissue swelling. Talar dome appears grossly intact. Degenerative changes are seen in the midfoot. Calcaneal spurs.  IMPRESSION: Medial malleolar fracture is age indeterminate. Please correlate clinically. Diffuse soft tissue swelling.   Electronically Signed   By: Lorin Picket M.D.   On: 05/17/2013 08:11   Dg Abd Acute W/chest  05/16/2013   CLINICAL DATA:  Intermittent umbilical pain.  Occasional emesis.  EXAM: ACUTE ABDOMEN SERIES (ABDOMEN 2 VIEW & CHEST 1 VIEW)  COMPARISON:  None.  FINDINGS: Accounting for the low lung volumes and AP technique, there is borderline cardiomegaly. No edema.  No free intraperitoneal gas noted on the left-side-down lateral decubitus view. Several air-fluid levels are present in nondilated loops of small bowel. Gas and formed stool noted in the colon.  The L5  vertebra is partially sacralized.  IMPRESSION: 1. No dilated bowel. There is gas scattered in the small bowel with some air-fluid levels in small bowel, but not a necessarily abnormal number. Formed stool noted in the colon. Overall no specific bowel gas pattern abnormality is observed. 2. Borderline cardiomegaly.   Electronically Signed   By: Sherryl Barters M.D.   On: 05/16/2013 18:33    Scheduled Meds: . colchicine  0.6 mg Oral Daily  . cyanocobalamin  1,000 mcg Intramuscular Daily  . feeding supplement (ENSURE COMPLETE)  237 mL Oral BID BM  . feeding supplement (ENSURE)  1 Container Oral Q24H  . feeding supplement (RESOURCE BREEZE)  1 Container Oral BID WC  . influenza vac split quadrivalent PF  0.5 mL Intramuscular Tomorrow-1000  . multivitamin with minerals  1 tablet Oral Daily  . pantoprazole (PROTONIX) IV  40 mg Intravenous Q12H  . senna-docusate  2 tablet Oral BID  . sodium bicarbonate  650 mg Oral BID  . sodium chloride  3 mL Intravenous Q12H  . sorbitol  30 mL Oral Daily  . tamsulosin  0.4 mg Oral QPC supper   Continuous Infusions: . heparin 950 Units/hr (05/18/13 1148)    Principal Problem:   Symptomatic anemia Active Problems:   Acute-on-chronic kidney injury   Essential hypertension, benign   Gout   Urinary retention   Leg swelling   Poor appetite   Unspecified constipation    Time spent: 35 minutes.     Kimber Relic  [facility] Pager [phone]. If 7PM-7AM, please contact night-coverage at [email or web address], password [facility] 05/18/2013, 1:12 PM  LOS: 2 days

## 2013-05-18 NOTE — Progress Notes (Signed)
PNA vaccine information provided to patient and family. Patient refusing flu vaccine at this time.

## 2013-05-18 NOTE — Progress Notes (Signed)
CONSULT IN PROGRESS:  80 year old [place] man with pancytopenia, adequate iron and folate stores but B-12 low at 160.Additional problems are acute on chronic renal injury and R LE DVT.  (1) sQ B-12 as you are doing. Initial response can result in abrupt and severe hypokalemia-- follow potassium closely next 48 h  (2) send intrinsic factor antibodies to confirm diagnosis of pernicious anemia  (3) anemia of renal disease may blunt reticulocyte response to B-12 supplementation  (4) so long as platelets are >50K and there is no overt bleeding recommend unfractionated heparin for the DVT and transition to coumadin when stable  Discussed with Dr Tyrell Antonio. Full note to follow

## 2013-05-18 NOTE — Progress Notes (Signed)
Heparin gtt started at 9.83ml/hr which is 950units/hour. Cosinged by Lake Bells, RN.

## 2013-05-18 NOTE — Consult Note (Deleted)
[facility] NOTE   MRN : [id number]  Reason for Consult: ESRD Referring Physician: Windy Kalata, MD   History of Present Illness: 80 y/o male with ESRD.  WE have been ask to consult for permanent dialysis access.  He was admitted secondary to gout pain.  On exam he has HGB 5.6 and Scr 4.26.  He has a history of hypertension, but has not taken HTN medications for years.  He denise hyperlipidemia and DM.     Current Facility-Administered Medications  Medication Dose Route Frequency Provider Last Rate Last Dose  . acetaminophen (TYLENOL) tablet 650 mg  650 mg Oral Q6H PRN Berle Mull, MD       Or  . acetaminophen (TYLENOL) suppository 650 mg  650 mg Rectal Q6H PRN Berle Mull, MD      . colchicine tablet 0.6 mg  0.6 mg Oral Daily Belkys A Harrel Carina, MD   0.6 mg at 05/18/13 1012  . cyanocobalamin ((VITAMIN B-12)) injection 1,000 mcg  1,000 mcg Intramuscular Daily Belkys A Harrel Carina, MD   1,000 mcg at 05/18/13 1013  . feeding supplement (ENSURE COMPLETE) (ENSURE COMPLETE) liquid 237 mL  237 mL Oral BID BM Baird Lyons, RD   237 mL at 05/17/13 1500  . feeding supplement (ENSURE) (ENSURE) pudding 1 Container  1 Container Oral Q24H Reanne [place] Pink, RD      . feeding supplement (RESOURCE BREEZE) (RESOURCE BREEZE) liquid 1 Container  1 Container Oral BID WC Baird Lyons, RD   1 Container at 05/18/13 1151  . heparin ADULT infusion 100 units/mL (25000 units/250 mL)  950 Units/hr Intravenous Continuous Saundra Shelling, RPH 9.5 mL/hr at 05/18/13 1148 950 Units/hr at 05/18/13 1148  . hydrALAZINE (APRESOLINE) injection 10 mg  10 mg Intravenous Q4H PRN Berle Mull, MD      . influenza vac split quadrivalent PF (FLUARIX) injection 0.5 mL  0.5 mL Intramuscular Tomorrow-1000 Tiffany P Fairing, RN      . multivitamin with minerals tablet 1 tablet  1 tablet Oral Daily Baird Lyons, RD   1 tablet at 05/18/13 1012  . ondansetron (ZOFRAN)  injection 4 mg  4 mg Intravenous Q6H PRN Belkys A Harrel Carina, MD      . ondansetron (ZOFRAN) tablet 4 mg  4 mg Oral Q6H PRN Berle Mull, MD      . oxyCODONE (Oxy IR/ROXICODONE) immediate release tablet 5 mg  5 mg Oral Q4H PRN Berle Mull, MD   5 mg at 05/17/13 0036  . pantoprazole (PROTONIX) injection 40 mg  40 mg Intravenous Q12H Berle Mull, MD   40 mg at 05/18/13 1012  . senna-docusate (Senokot-S) tablet 2 tablet  2 tablet Oral BID Gardiner Barefoot, NP   2 tablet at 05/18/13 0551  . sodium bicarbonate tablet 650 mg  650 mg Oral BID Windy Kalata, MD   650 mg at 05/18/13 1151  . sodium chloride 0.9 % injection 3 mL  3 mL Intravenous Q12H Berle Mull, MD   3 mL at 05/17/13 2045  . sorbitol 70 % solution 30 mL  30 mL Oral Daily Belkys A Harrel Carina, MD   30 mL at 05/18/13 1012  . tamsulosin (FLOMAX) capsule 0.4 mg  0.4 mg Oral QPC supper Berle Mull, MD   0.4 mg at 05/17/13 1824    Pt meds include: Statin :No Betablocker: No ASA: No Other anticoagulants/antiplatelets:   Past Medical History  Diagnosis Date  .  Hypertension   . Gout     History reviewed. No pertinent past surgical history.  Social History History  Substance Use Topics  . Smoking status: Former Research scientist (life sciences)  . Smokeless tobacco: Not on file  . Alcohol Use: Yes     Comment: "every once in a while, not too regular"    Family History Father HTN Mother HTN  No Known Allergies   REVIEW OF SYSTEMS  General: [ ]  Weight loss, [ ]  Fever, [ ]  chills Neurologic: [ ]  Dizziness, [ ]  Blackouts, [ ]  Seizure [ ]  Stroke, [ ]  "Mini stroke", [ ]  Slurred speech, [ ]  Temporary blindness; [ ]  weakness in arms or legs, [ ]  Hoarseness [ ]  Dysphagia Cardiac: [ ]  Chest pain/pressure, [ ]  Shortness of breath at rest [ ]  Shortness of breath with exertion, [ ]  Atrial fibrillation or irregular heartbeat  Vascular: [ ]  Pain in legs with walking, [ ]  Pain in legs at rest, [ ]  Pain in legs at night,  [ ]  Non-healing  ulcer, [ ]  Blood clot in vein/DVT,   Pulmonary: [ ]  Home oxygen, [ ]  Productive cough, [ ]  Coughing up blood, [ ]  Asthma,  [ ]  Wheezing [ ]  COPD Musculoskeletal:  [x ] Arthritis, [ ]  Low back pain, [x ] Joint pain Hematologic: [ ]  Easy Bruising, [ ]  Anemia; [ ]  Hepatitis Gastrointestinal: [ ]  Blood in stool, [ ]  Gastroesophageal Reflux/heartburn, Urinary: [ ]  chronic Kidney disease, [ ]  on HD - [ ]  MWF or [ ]  TTHS, [ ]  Burning with urination, [ ]  Difficulty urinating Skin: [ ]  Rashes, [ ]  Wounds Psychological: [ ]  Anxiety, [ ]  Depression  Physical Examination Filed Vitals:   05/17/13 1554 05/17/13 2006 05/18/13 0518 05/18/13 1003  BP: 138/69 142/73 147/67 121/68  Pulse: 62 73 55 72  Temp: 97 F (36.1 C) 97.6 F (36.4 C) 98.4 F (36.9 C)   TempSrc: Oral Oral Oral   Resp: 18 18 18    Height:      Weight:   151 lb 6.4 oz (68.675 kg)   SpO2:  99% 99%    Body mass index is 22.35 kg/(m^2).  General:  WDWN in NAD HENT: WNL Eyes: Pupils equal Pulmonary: normal non-labored breathing Cardiac: RRR,  No carotid bruits Abdomen: soft, NT, no masses Skin: no rashes, ulcers noted;  no Gangrene , no cellulitis; no open wounds;   Vascular Exam/Pulses:Left leg mod-sever edema. Skin is warm to touch.  Palpable radial and brachial pulses bil.  IV in left antecubital area.  Right hand dominant  Musculoskeletal: no muscle wasting or atrophy; positive left LE edema  Neurologic: A&O X 3; Appropriate Affect ;  SENSATION: normal; MOTOR FUNCTION: 5/5 Symmetric, weakness in left hand 4/5.  Wearing right ankle brace. Speech is fluent/normal   Significant Diagnostic Studies: CBC Lab Results  Component Value Date   WBC 3.2* 05/18/2013   HGB 8.3* 05/18/2013   HCT 23.4* 05/18/2013   MCV 93.6 05/18/2013   PLT 66* 05/18/2013    BMET    Component Value Date/Time   NA 138 05/18/2013 0440   K 4.8 05/18/2013 0440   CL 105 05/18/2013 0440   CO2 18* 05/18/2013 0440   GLUCOSE 174* 05/18/2013 0440   BUN  85* 05/18/2013 0440   CREATININE 4.09* 05/18/2013 0440   CALCIUM 8.8 05/18/2013 0440   GFRNONAA 12* 05/18/2013 0440   GFRAA 14* 05/18/2013 0440   Estimated Creatinine Clearance: 13.8 ml/min (by C-G formula based on Cr  of 4.09).  COAG Lab Results  Component Value Date   INR 1.31 05/16/2013     Non-Invasive Vascular Imaging:  Pending vein mapping  ASSESSMENT/PLAN:  ESRD HTN Pending vein mapping we will plan AV fistula verses graft for dialysis access.   Laurence Slate [facility] 05/18/2013 12:11 PM

## 2013-05-18 NOTE — Progress Notes (Signed)
Dr. Harrel Carina made aware of bilateral venous doppler results that were called by Echo lab. MD to place new orders.

## 2013-05-18 NOTE — Progress Notes (Signed)
PT Cancellation Note  Patient Details Name: Caleb Hayes MRN: [id number] DOB: April 06, 1929   Cancelled Treatment:    Reason Eval/Treat Not Completed: Medical issues which prohibited therapy (pt with acute bil DVT and will hold at this time)   Melford Aase 05/18/2013, 11:02 AM Elwyn Reach, [place]

## 2013-05-18 NOTE — Progress Notes (Signed)
ANTICOAGULATION CONSULT NOTE Pharmacy Consult:  Heparin Indication:  DVT  No Known Allergies  Patient Measurements: Height: 5\' 9"  (175.3 cm) Weight: 151 lb 6.4 oz (68.675 kg) (bed scale) IBW/kg (Calculated) : 70.7 Heparin Dosing Weight: 69 kg  Vital Signs: Temp: 98 F (36.7 C) (03/10 2114) Temp src: Oral (03/10 2114) BP: 133/61 mmHg (03/10 2114) Pulse Rate: 57 (03/10 2114)  Labs:  Recent Labs  05/16/13 1700 05/16/13 1758 05/17/13 1030 05/17/13 1242 05/17/13 1937 05/18/13 0440 05/18/13 2223  HGB 5.6*  --  7.3* 7.6* 8.6* 8.3*  --   HCT 15.5*  --  19.8* 20.5* 23.4* 23.4*  --   PLT 101*  --  75* 80* 77* 66*  --   APTT  --  30  --   --   --   --   --   LABPROT  --  16.0*  --   --   --   --   --   INR  --  1.31  --   --   --   --   --   HEPARINUNFRC  --   --   --   --   --   --  0.23*  CREATININE 4.26*  --  4.34*  --   --  4.09*  --   TROPONINI <0.30  --   --   --   --   --   --     Estimated Creatinine Clearance: 13.8 ml/min (by C-G formula based on Cr of 4.09).  Assessment: 80 y o male with new DVT for heparin.   Goal of Therapy:  Heparin level 0.3-0.7 units/ml Monitor platelets by anticoagulation protocol: Yes   Plan:  Increase Heparin 1150 units/hr Follow-up am labs.  Phillis Knack, PharmD, BCPS   05/18/2013, 11:06 PM

## 2013-05-18 NOTE — Progress Notes (Signed)
S: Taking liquid diet OK.  Found to have DVT O:BP 121/68  Pulse 72  Temp(Src) 98.4 F (36.9 C) (Oral)  Resp 18  Ht 5\' 9"  (1.753 m)  Wt 68.675 kg (151 lb 6.4 oz)  BMI 22.35 kg/m2  SpO2 99%  Intake/Output Summary (Last 24 hours) at 05/18/13 1042 Last data filed at 05/18/13 0900  Gross per 24 hour  Intake   2100 ml  Output   1585 ml  Net    515 ml   Weight change: 0.771 kg (1 lb 11.2 oz) [id number] and alert CVS:RRR Resp:clear Abd:+ BS NTND Ext:1+ edema NEURO:CNI Ox3 No asterixis   . colchicine  0.6 mg Oral Daily  . cyanocobalamin  1,000 mcg Intramuscular Daily  . feeding supplement (ENSURE COMPLETE)  237 mL Oral BID BM  . feeding supplement (ENSURE)  1 Container Oral Q24H  . feeding supplement (RESOURCE BREEZE)  1 Container Oral BID WC  . influenza vac split quadrivalent PF  0.5 mL Intramuscular Tomorrow-1000  . multivitamin with minerals  1 tablet Oral Daily  . pantoprazole (PROTONIX) IV  40 mg Intravenous Q12H  . senna-docusate  2 tablet Oral BID  . sodium chloride  3 mL Intravenous Q12H  . sorbitol  30 mL Oral Daily  . tamsulosin  0.4 mg Oral QPC supper   US Renal  05/17/2013   CLINICAL DATA:  Left flank pain.  Renal failure.  EXAM: RENAL/URINARY TRACT ULTRASOUND COMPLETE  COMPARISON:  Ultrasound dated 05/18/2009  FINDINGS: Right Kidney:  Length: 9.3 cm. Diffuse increased echogenicity with several small calcifications in the upper pole. No hydronephrosis.  Left Kidney:  Length: 9.8 cm. Diffused increased echogenicity. No hydronephrosis.  Bladder:  The bladder is empty with a Foley catheter in place.  IMPRESSION: Echogenic renal parenchyma bilaterally consistent with renal medical disease, essentially unchanged since 05/18/2009. No obstruction.   Electronically Signed   By: Rozetta Nunnery M.D.   On: 05/17/2013 22:48   Dg Ankle Left Port  05/17/2013   CLINICAL DATA:  History of gout.  Swelling and pain.  EXAM: PORTABLE LEFT ANKLE - 2 VIEW  COMPARISON:  None.  FINDINGS: Diffuse  soft tissue swelling. There is a tiny erosion along the inferior aspect of the medial malleolus. No evidence of an acute fracture. Calcaneal spur.  IMPRESSION: Tiny osseous erosion along the medial malleolus is in keeping with the given history of gout. Diffuse soft tissue swelling.   Electronically Signed   By: Lorin Picket M.D.   On: 05/17/2013 08:13   Dg Ankle Right Port  05/17/2013   CLINICAL DATA:  Swelling and pain.  History of gout.  EXAM: PORTABLE RIGHT ANKLE - 2 VIEW  COMPARISON:  None.  FINDINGS: There is a minimally displaced and transversely oriented fracture of the medial malleolus, indeterminate in age. Diffuse soft tissue swelling. Talar dome appears grossly intact. Degenerative changes are seen in the midfoot. Calcaneal spurs.  IMPRESSION: Medial malleolar fracture is age indeterminate. Please correlate clinically. Diffuse soft tissue swelling.   Electronically Signed   By: Lorin Picket M.D.   On: 05/17/2013 08:11   Dg Abd Acute W/chest  05/16/2013   CLINICAL DATA:  Intermittent umbilical pain.  Occasional emesis.  EXAM: ACUTE ABDOMEN SERIES (ABDOMEN 2 VIEW & CHEST 1 VIEW)  COMPARISON:  None.  FINDINGS: Accounting for the low lung volumes and AP technique, there is borderline cardiomegaly. No edema.  No free intraperitoneal gas noted on the left-side-down lateral decubitus view. Several air-fluid levels are present  in nondilated loops of small bowel. Gas and formed stool noted in the colon.  The L5 vertebra is partially sacralized.  IMPRESSION: 1. No dilated bowel. There is gas scattered in the small bowel with some air-fluid levels in small bowel, but not a necessarily abnormal number. Formed stool noted in the colon. Overall no specific bowel gas pattern abnormality is observed. 2. Borderline cardiomegaly.   Electronically Signed   By: Sherryl Barters M.D.   On: 05/16/2013 18:33   BMET    Component Value Date/Time   NA 138 05/18/2013 0440   K 4.8 05/18/2013 0440   CL 105 05/18/2013  0440   CO2 18* 05/18/2013 0440   GLUCOSE 174* 05/18/2013 0440   BUN 85* 05/18/2013 0440   CREATININE 4.09* 05/18/2013 0440   CALCIUM 8.8 05/18/2013 0440   GFRNONAA 12* 05/18/2013 0440   GFRAA 14* 05/18/2013 0440   CBC    Component Value Date/Time   WBC 3.2* 05/18/2013 0440   RBC 2.50* 05/18/2013 0440   HGB 8.3* 05/18/2013 0440   HCT 23.4* 05/18/2013 0440   PLT 66* 05/18/2013 0440   MCV 93.6 05/18/2013 0440   MCH 33.2 05/18/2013 0440   MCHC 35.5 05/18/2013 0440   RDW 23.7* 05/18/2013 0440   LYMPHSABS 0.9 05/17/2013 1030   MONOABS 0.1 05/17/2013 1030   EOSABS 0.0 05/17/2013 1030   BASOSABS 0.0 05/17/2013 1030     Assessment: 1. CKD 4/5.  US show echogenic kidneys confirming chronicity. His Scr in 2011 was 2.3 2. Anemia 3. Hx HTN 4. Pancytopenia, suspicious for myeloma 5. DVT Plan: 1.  I don't think he needs HD now but he will in near future.  Spoke to pt and daughters about HD and they want to proceed with AVF/AVG. 2. Show HD videos 3. Vein mapping 4. Ask VVS to see 5. DC IV fluids and foley 6. Await SPEP 7. Start PO bicarb   Jaydalynn Olivero T

## 2013-05-18 NOTE — Progress Notes (Signed)
*  Preliminary Results* Bilateral lower extremity venous duplex completed. Bilateral lower extremities are positive for deep vein thrombosis involving the right common femoral, profunda femoral, popliteal, and peroneal veins. There is hyperacute thrombosis of the right saphenofemoral junctions, and thrombosis of indeterminate age of the left posterior tibial veins. There is no evidence of Baker's cyst bilaterally.  05/18/2013  Maudry Mayhew, RVT, RDCS, RDMS

## 2013-05-18 NOTE — Progress Notes (Signed)
ANTICOAGULATION CONSULT NOTE - Initial Consult  Pharmacy Consult:  Heparin Indication:  New DVT  No Known Allergies  Patient Measurements: Height: 5\' 9"  (175.3 cm) Weight: 151 lb 6.4 oz (68.675 kg) (bed scale) IBW/kg (Calculated) : 70.7 Heparin Dosing Weight: 69 kg  Vital Signs: Temp: 98.4 F (36.9 C) (03/10 0518) Temp src: Oral (03/10 0518) BP: 121/68 mmHg (03/10 1003) Pulse Rate: 72 (03/10 1003)  Labs:  Recent Labs  05/16/13 1700 05/16/13 1758 05/17/13 1030 05/17/13 1242 05/17/13 1937 05/18/13 0440  HGB 5.6*  --  7.3* 7.6* 8.6* 8.3*  HCT 15.5*  --  19.8* 20.5* 23.4* 23.4*  PLT 101*  --  75* 80* 77* 66*  APTT  --  30  --   --   --   --   LABPROT  --  16.0*  --   --   --   --   INR  --  1.31  --   --   --   --   CREATININE 4.26*  --  4.34*  --   --  4.09*  TROPONINI <0.30  --   --   --   --   --     Estimated Creatinine Clearance: 13.8 ml/min (by C-G formula based on Cr of 4.09).   Medical History: Past Medical History  Diagnosis Date  . Hypertension   . Gout        Assessment: 38 YOM with new DVT to start IV heparin.  Patient has anemia and thrombocytopenia.  No bleeding reported.   Goal of Therapy:  Heparin level 0.3-0.7 units/ml Monitor platelets by anticoagulation protocol: Yes    Plan:  - Heparin gtt at 950 units/hr, no bolus - Check 8 hr HL - Daily HL / CBC - Monitor closely for s/sx of bleeding - F/U oral AC when possible    Deronte Solis D. Mina Marble, PharmD, BCPS Pager:  [phone] 05/18/2013, 10:43 AM

## 2013-05-18 NOTE — Consult Note (Signed)
Vascular and Vein Specialist of [facility]  Patient name: Caleb Hayes MRN: [id number] DOB: 1929/05/07 Sex: male  REASON FOR CONSULT: Evaluate for hemodialysis access. Consult is from Dr. Fleet Contras.  HPI: EZIAH Hayes is a 80 y.o. male who was admitted on 05/16/2013. He presented with exertional fatigue. He was seen in consultation yesterday by the nephrologist. He was noted to have stage 4/stage V chronic kidney disease. Dr. Mercy Moore felt that he would potentially need dialysis in the near future and we were asked to evaluate him for hemodialysis access. His workup is pending. The patient does admit to generalized fatigue and exertional fatigue. He denies any nausea or vomiting. He denies chest pain or shortness of breath.  Of note he had a venous duplex scan which was positive for a DVT involving the right common femoral, deep femoral, popliteal, and peroneal veins. There was also noted to be thrombosis of the right saphenofemoral junction and left posterior tibial veins. He has been started on heparin.  His vein mapping is pending.  Past Medical History  Diagnosis Date  . Hypertension   . Gout    History reviewed. No pertinent family history.  SOCIAL HISTORY: History  Substance Use Topics  . Smoking status: Former Research scientist (life sciences)  . Smokeless tobacco: Not on file  . Alcohol Use: Yes     Comment: "every once in a while, not too regular"   No Known Allergies  Current Facility-Administered Medications  Medication Dose Route Frequency Provider Last Rate Last Dose  . acetaminophen (TYLENOL) tablet 650 mg  650 mg Oral Q6H PRN Berle Mull, MD       Or  . acetaminophen (TYLENOL) suppository 650 mg  650 mg Rectal Q6H PRN Berle Mull, MD      . colchicine tablet 0.6 mg  0.6 mg Oral Daily Belkys A Harrel Carina, MD   0.6 mg at 05/18/13 1012  . cyanocobalamin ((VITAMIN B-12)) injection 1,000 mcg  1,000 mcg Intramuscular Daily Belkys A Harrel Carina, MD   1,000 mcg at  05/18/13 1013  . feeding supplement (ENSURE COMPLETE) (ENSURE COMPLETE) liquid 237 mL  237 mL Oral BID BM Baird Lyons, RD   237 mL at 05/17/13 1500  . feeding supplement (ENSURE) (ENSURE) pudding 1 Container  1 Container Oral Q24H Reanne [place] Pink, RD      . feeding supplement (RESOURCE BREEZE) (RESOURCE BREEZE) liquid 1 Container  1 Container Oral BID WC Baird Lyons, RD   1 Container at 05/18/13 1151  . heparin ADULT infusion 100 units/mL (25000 units/250 mL)  950 Units/hr Intravenous Continuous Saundra Shelling, RPH 9.5 mL/hr at 05/18/13 1148 950 Units/hr at 05/18/13 1148  . hydrALAZINE (APRESOLINE) injection 10 mg  10 mg Intravenous Q4H PRN Berle Mull, MD      . influenza vac split quadrivalent PF (FLUARIX) injection 0.5 mL  0.5 mL Intramuscular Tomorrow-1000 Tiffany P Fairing, RN      . multivitamin with minerals tablet 1 tablet  1 tablet Oral Daily Baird Lyons, RD   1 tablet at 05/18/13 1012  . ondansetron (ZOFRAN) injection 4 mg  4 mg Intravenous Q6H PRN Belkys A Harrel Carina, MD      . ondansetron (ZOFRAN) tablet 4 mg  4 mg Oral Q6H PRN Berle Mull, MD      . oxyCODONE (Oxy IR/ROXICODONE) immediate release tablet 5 mg  5 mg Oral Q4H PRN Berle Mull, MD   5 mg at 05/17/13 0036  . pantoprazole (PROTONIX) injection  40 mg  40 mg Intravenous Q12H Berle Mull, MD   40 mg at 05/18/13 1012  . senna-docusate (Senokot-S) tablet 2 tablet  2 tablet Oral BID Gardiner Barefoot, NP   2 tablet at 05/18/13 0551  . sodium bicarbonate tablet 650 mg  650 mg Oral BID Windy Kalata, MD   650 mg at 05/18/13 1151  . sodium chloride 0.9 % injection 3 mL  3 mL Intravenous Q12H Berle Mull, MD   3 mL at 05/17/13 2045  . sorbitol 70 % solution 30 mL  30 mL Oral Daily Belkys A Harrel Carina, MD   30 mL at 05/18/13 1012  . tamsulosin (FLOMAX) capsule 0.4 mg  0.4 mg Oral QPC supper Berle Mull, MD   0.4 mg at 05/17/13 1824   REVIEW OF SYSTEMS: Valu.Nieves ] denotes positive finding; [  ] denotes  negative finding CARDIOVASCULAR:  [ ]  chest pain   [ ]  chest pressure   [ ]  palpitations   [ ]  orthopnea   [ ]  dyspnea on exertion   [ ]  claudication   [ ]  rest pain   [ ]  DVT   [ ]  phlebitis PULMONARY:   [ ]  productive cough   [ ]  asthma   [ ]  wheezing NEUROLOGIC:   [ ]  weakness  [ ]  paresthesias  [ ]  aphasia  [ ]  amaurosis  [ ]  dizziness HEMATOLOGIC:   [ ]  bleeding problems   [ ]  clotting disorders MUSCULOSKELETAL:  [ ]  joint pain   [ ]  joint swelling [ ]  leg swelling GASTROINTESTINAL: [ ]   blood in stool  [ ]   hematemesis GENITOURINARY:  [ ]   dysuria  [ ]   hematuria PSYCHIATRIC:  [ ]  history of major depression INTEGUMENTARY:  [ ]  rashes  [ ]  ulcers CONSTITUTIONAL:  [ ]  fever   [ ]  chills  PHYSICAL EXAM: Filed Vitals:   05/17/13 1554 05/17/13 2006 05/18/13 0518 05/18/13 1003  BP: 138/69 142/73 147/67 121/68  Pulse: 62 73 55 72  Temp: 97 F (36.1 C) 97.6 F (36.4 C) 98.4 F (36.9 C)   TempSrc: Oral Oral Oral   Resp: 18 18 18    Height:      Weight:   151 lb 6.4 oz (68.675 kg)   SpO2:  99% 99%    Body mass index is 22.35 kg/(m^2). GENERAL: The patient is a well-nourished male, in no acute distress. The vital signs are documented above. CARDIOVASCULAR: There is a regular rate and rhythm. He has palpable radial pulses bilaterally. PULMONARY: There is good air exchange bilaterally without wheezing or rales. ABDOMEN: Soft and non-tender with normal pitched bowel sounds.  MUSCULOSKELETAL: There are no major deformities or cyanosis. NEUROLOGIC: No focal weakness or paresthesias are detected. SKIN: IV in left upper arm cephalic vein.  He appears to have a reasonable forearm cephalic vein on the right. PSYCHIATRIC: The patient has a normal affect.  DATA:  Lab Results  Component Value Date   WBC 3.2* 05/18/2013   HGB 8.3* 05/18/2013   HCT 23.4* 05/18/2013   MCV 93.6 05/18/2013   PLT 66* 05/18/2013   Lab Results  Component Value Date   NA 138 05/18/2013   K 4.8 05/18/2013   CL 105  05/18/2013   CO2 18* 05/18/2013   Lab Results  Component Value Date   CREATININE 4.09* 05/18/2013   Lab Results  Component Value Date   INR 1.31 05/16/2013    MEDICAL ISSUES:  1. Stage 4/5 CKD:  we were asked to evaluate the patient for hemodialysis access. Given that he has an IV in the left upper arm cephalic vein, I would favor a radial cephalic or brachial cephalic AV fistula on the right pending his vein map. However, the patient feels strongly that he does not wish to proceed with surgery at this time. He would rather wait until he begins dialysis and had a catheter and then consider placement of a fistula. I've explained to him that given that he will be started on Coumadin for his DVT it would make more sense for him to proceed with placement of the fistula at this time prior to his Coumadin being therapeutic. However again he feels strongly about not proceeding with surgery at this time.  2. DVT: he is on heparin now and will likely need Coumadin.  3. Thrombocytopenia: this will have to be followed closely in this heparin may need to be discontinued if his thrombocytopenia worsens. I would recommend sending a HIT panel.  Vein map is pending.  [facility]  Beeper: [phone]

## 2013-05-19 DIAGNOSIS — C9000 Multiple myeloma not having achieved remission: Secondary | ICD-10-CM

## 2013-05-19 DIAGNOSIS — M109 Gout, unspecified: Secondary | ICD-10-CM

## 2013-05-19 DIAGNOSIS — D631 Anemia in chronic kidney disease: Secondary | ICD-10-CM

## 2013-05-19 DIAGNOSIS — I82409 Acute embolism and thrombosis of unspecified deep veins of unspecified lower extremity: Secondary | ICD-10-CM

## 2013-05-19 DIAGNOSIS — D61818 Other pancytopenia: Secondary | ICD-10-CM

## 2013-05-19 DIAGNOSIS — N19 Unspecified kidney failure: Secondary | ICD-10-CM

## 2013-05-19 DIAGNOSIS — N039 Chronic nephritic syndrome with unspecified morphologic changes: Secondary | ICD-10-CM

## 2013-05-19 DIAGNOSIS — I10 Essential (primary) hypertension: Secondary | ICD-10-CM

## 2013-05-19 DIAGNOSIS — E876 Hypokalemia: Secondary | ICD-10-CM

## 2013-05-19 DIAGNOSIS — N189 Chronic kidney disease, unspecified: Secondary | ICD-10-CM

## 2013-05-19 LAB — CBC
HEMATOCRIT: 23 % — AB (ref 39.0–52.0)
Hemoglobin: 8.4 g/dL — ABNORMAL LOW (ref 13.0–17.0)
MCH: 34.6 pg — ABNORMAL HIGH (ref 26.0–34.0)
MCHC: 36.5 g/dL — ABNORMAL HIGH (ref 30.0–36.0)
MCV: 94.7 fL (ref 78.0–100.0)
Platelets: 75 10*3/uL — ABNORMAL LOW (ref 150–400)
RBC: 2.43 MIL/uL — AB (ref 4.22–5.81)
RDW: 22.5 % — ABNORMAL HIGH (ref 11.5–15.5)
WBC: 4.9 10*3/uL (ref 4.0–10.5)

## 2013-05-19 LAB — RENAL FUNCTION PANEL
Albumin: 2.3 g/dL — ABNORMAL LOW (ref 3.5–5.2)
BUN: 79 mg/dL — AB (ref 6–23)
CHLORIDE: 101 meq/L (ref 96–112)
CO2: 19 mEq/L (ref 19–32)
Calcium: 8.9 mg/dL (ref 8.4–10.5)
Creatinine, Ser: 3.49 mg/dL — ABNORMAL HIGH (ref 0.50–1.35)
GFR calc Af Amer: 17 mL/min — ABNORMAL LOW (ref >90)
GFR calc non Af Amer: 15 mL/min — ABNORMAL LOW (ref >90)
GLUCOSE: 117 mg/dL — AB (ref 70–99)
POTASSIUM: 3.9 meq/L (ref 3.7–5.3)
Phosphorus: 3.3 mg/dL (ref 2.3–4.6)
Sodium: 136 mEq/L — ABNORMAL LOW (ref 137–147)

## 2013-05-19 LAB — PROTEIN ELECTROPHORESIS, SERUM
ALBUMIN ELP: 45.4 % — AB (ref 55.8–66.1)
ALPHA-1-GLOBULIN: 7.7 % — AB (ref 2.9–4.9)
Alpha-2-Globulin: 10.3 % (ref 7.1–11.8)
BETA GLOBULIN: 5.6 % (ref 4.7–7.2)
Beta 2: 7 % — ABNORMAL HIGH (ref 3.2–6.5)
Gamma Globulin: 24 % — ABNORMAL HIGH (ref 11.1–18.8)
M-Spike, %: NOT DETECTED g/dL
Total Protein ELP: 6.6 g/dL (ref 6.0–8.3)

## 2013-05-19 LAB — KAPPA/LAMBDA LIGHT CHAINS
KAPPA FREE LGHT CHN: 17.7 mg/dL — AB (ref 0.33–1.94)
KAPPA, LAMDA LIGHT CHAIN RATIO: 2.08 — AB (ref 0.26–1.65)
Lambda free light chains: 8.51 mg/dL — ABNORMAL HIGH (ref 0.57–2.63)

## 2013-05-19 LAB — HEPARIN LEVEL (UNFRACTIONATED): Heparin Unfractionated: 0.61 IU/mL (ref 0.30–0.70)

## 2013-05-19 MED ORDER — CYANOCOBALAMIN 1000 MCG/ML IJ SOLN: 1000.0000 ug | Freq: Once | INTRAMUSCULAR | Status: DC

## 2013-05-19 MED ORDER — PANTOPRAZOLE SODIUM 40 MG PO TBEC
40.0000 mg | DELAYED_RELEASE_TABLET | Freq: Every day | ORAL | Status: DC
  Administered 2013-05-19 – 2013-05-21 (×3): 40 mg via ORAL
  Filled 2013-05-19 (×3): qty 1

## 2013-05-19 MED ORDER — COLCHICINE 0.6 MG PO TABS
0.3000 mg | ORAL_TABLET | Freq: Every day | ORAL | Status: DC
  Administered 2013-05-20 – 2013-05-21 (×2): 0.3 mg via ORAL
  Filled 2013-05-19 (×2): qty 0.5

## 2013-05-19 MED ORDER — ENOXAPARIN SODIUM 80 MG/0.8ML ~~LOC~~ SOLN
70.0000 mg | SUBCUTANEOUS | Status: DC
  Administered 2013-05-19 – 2013-05-21 (×3): 70 mg via SUBCUTANEOUS
  Filled 2013-05-19 (×3): qty 0.8

## 2013-05-19 MED ORDER — DARBEPOETIN ALFA-POLYSORBATE 100 MCG/0.5ML IJ SOLN
100.0000 ug | INTRAMUSCULAR | Status: DC
  Administered 2013-05-19: 100 ug via SUBCUTANEOUS
  Filled 2013-05-19: qty 0.5

## 2013-05-19 NOTE — Progress Notes (Signed)
S: Wants solid food O:BP 133/66  Pulse 57  Temp(Src) 97.9 F (36.6 C) (Oral)  Resp 18  Ht 5\' 9"  (1.753 m)  Wt 69.2 kg (152 lb 8.9 oz)  BMI 22.52 kg/m2  SpO2 97%  Intake/Output Summary (Last 24 hours) at 05/19/13 0925 Last data filed at 05/19/13 0249  Gross per 24 hour  Intake 1911.98 ml  Output   1475 ml  Net 436.98 ml   Weight change: 0.525 kg (1 lb 2.5 oz) [id number] and alert CVS:RRR Resp:clear Abd:+ BS NTND Ext:1+ edema NEURO:CNI Ox3 No asterixis   . colchicine  0.6 mg Oral Daily  . cyanocobalamin  1,000 mcg Intramuscular Daily  . feeding supplement (ENSURE COMPLETE)  237 mL Oral BID BM  . feeding supplement (ENSURE)  1 Container Oral Q24H  . feeding supplement (RESOURCE BREEZE)  1 Container Oral BID WC  . influenza vac split quadrivalent PF  0.5 mL Intramuscular Tomorrow-1000  . multivitamin with minerals  1 tablet Oral Daily  . pantoprazole (PROTONIX) IV  40 mg Intravenous Q12H  . senna-docusate  2 tablet Oral BID  . sodium bicarbonate  650 mg Oral BID  . sodium chloride  3 mL Intravenous Q12H  . sorbitol  30 mL Oral Daily  . tamsulosin  0.4 mg Oral QPC supper   US Renal  05/17/2013   CLINICAL DATA:  Left flank pain.  Renal failure.  EXAM: RENAL/URINARY TRACT ULTRASOUND COMPLETE  COMPARISON:  Ultrasound dated 05/18/2009  FINDINGS: Right Kidney:  Length: 9.3 cm. Diffuse increased echogenicity with several small calcifications in the upper pole. No hydronephrosis.  Left Kidney:  Length: 9.8 cm. Diffused increased echogenicity. No hydronephrosis.  Bladder:  The bladder is empty with a Foley catheter in place.  IMPRESSION: Echogenic renal parenchyma bilaterally consistent with renal medical disease, essentially unchanged since 05/18/2009. No obstruction.   Electronically Signed   By: Rozetta Nunnery M.D.   On: 05/17/2013 22:48   BMET    Component Value Date/Time   NA 138 05/18/2013 0440   K 4.8 05/18/2013 0440   CL 105 05/18/2013 0440   CO2 18* 05/18/2013 0440   GLUCOSE  174* 05/18/2013 0440   BUN 85* 05/18/2013 0440   CREATININE 4.09* 05/18/2013 0440   CALCIUM 8.8 05/18/2013 0440   GFRNONAA 12* 05/18/2013 0440   GFRAA 14* 05/18/2013 0440   CBC    Component Value Date/Time   WBC 3.2* 05/18/2013 0440   RBC 2.55* 05/18/2013 1530   RBC 2.50* 05/18/2013 0440   HGB 8.3* 05/18/2013 0440   HCT 23.4* 05/18/2013 0440   PLT 66* 05/18/2013 0440   MCV 93.6 05/18/2013 0440   MCH 33.2 05/18/2013 0440   MCHC 35.5 05/18/2013 0440   RDW 23.7* 05/18/2013 0440   LYMPHSABS 0.9 05/17/2013 1030   MONOABS 0.1 05/17/2013 1030   EOSABS 0.0 05/17/2013 1030   BASOSABS 0.0 05/17/2013 1030     Assessment: 1. CKD 4/5.  US show echogenic kidneys confirming chronicity. His Scr in 2011 was 2.3.  Scr 3.5 today 2. Anemia 3. Hx HTN 4. Pancytopenia, suspicious for myeloma 5. DVT Plan: 1.  He is refusing access placement and now not sure he even wants to do HD.  If he has MM then I wouldn'Hayes offer him HD.  If he does not then it is his choice.  Spoke with daughter about this as well and she understands.  Put access placement on "back burner" for now.  At his age not pursuing HD is reasonable.  2. Await SPEP. If M spike then will need BM Bx 3. Advance diet to renal 4 Start aranesp   Caleb Hayes

## 2013-05-19 NOTE — Progress Notes (Signed)
Pt unable to void post catheter removal, bladder scan >500. MD on call notified and new order to catheterize PRN if unable to void. Ronnette Hila, RN

## 2013-05-19 NOTE — Progress Notes (Signed)
[place]  [phone] Fax:(336) [phone]     ID: Randye Lobo Braid OB: [birth year]  MR#: [id number]  HCW#:[id number]  PCP: No primary provider on file. GYN:   SU:  OTHER MD:  CHIEF COMPLAINT: I feel weak"  HX PRESENT ILLNESS: Mr. Godshall presented to the emergency room on 05/16/2013 with complaints of weakness, fatigue, and lower extremity pain. Exam was unrevealing but lab work showed a creatinine of 4.34 with BUN 89. Potassium, sodium, calcium, and total protein were normal. The white cell count was 3.7, platelet count 75,000 and hemoglobin 7.3 with an MCV of 98. Ferritin was 3619, folate 14.4, and B12 160. Bilateral venous Dopplers were consistent with acute deep venous thrombosis involving the right common femoral vein, right popliteal vein and the) [name]. On the left side there was a posterior tibial vein thrombus of indeterminate age.   We were consulted to further evaluate the cytopenias and rule out multiple myeloma. In addition there was a question of anticoagulation in this patient with thrombocytopenia.  INTERVAL HISTORY: I met with the patient and his daughter Apolonio Schneiders on 05/19/2013 in the patient's room  REVIEW OF SYSTEMS: Mr Partch denies recent fevers, rash, or bleeding. He has chronic joint pains especially in hips, knees and ankles. Denies headaches. No nausea or vomiting. He has felt weaker over the last few weeks but did not seekmedical help as he has no PCP.  PAST MEDICAL HISTORY: Past Medical History  Diagnosis Date  . Hypertension   . Gout     PAST SURGICAL HISTORY: History reviewed. No pertinent past surgical history.  FAMILY HISTORY History reviewed. No pertinent family history.  SOCIAL HISTORY:  The patient's daughter Apolonio Schneiders lives with him. She has a 3d shift job. The patient's sister lives nearby and "can come in" as needed.    ADVANCED DIRECTIVES: not in place   HEALTH MAINTENANCE: History  Substance Use Topics    . Smoking status: Former Research scientist (life sciences)  . Smokeless tobacco: Not on file  . Alcohol Use: Yes     Comment: "every once in a while, not too regular"    No Known Allergies  Current Facility-Administered Medications  Medication Dose Route Frequency Provider Last Rate Last Dose  . acetaminophen (TYLENOL) tablet 650 mg  650 mg Oral Q6H PRN Berle Mull, MD       Or  . acetaminophen (TYLENOL) suppository 650 mg  650 mg Rectal Q6H PRN Berle Mull, MD      . colchicine tablet 0.6 mg  0.6 mg Oral Daily Belkys A Harrel Carina, MD   0.6 mg at 05/18/13 1012  . cyanocobalamin ((VITAMIN B-12)) injection 1,000 mcg  1,000 mcg Intramuscular Daily Belkys A Harrel Carina, MD   1,000 mcg at 05/18/13 1013  . feeding supplement (ENSURE COMPLETE) (ENSURE COMPLETE) liquid 237 mL  237 mL Oral BID BM Baird Lyons, RD   237 mL at 05/17/13 1500  . feeding supplement (ENSURE) (ENSURE) pudding 1 Container  1 Container Oral Q24H Baird Lyons, RD   1 Container at 05/18/13 1715  . feeding supplement (RESOURCE BREEZE) (RESOURCE BREEZE) liquid 1 Container  1 Container Oral BID WC Baird Lyons, RD   1 Container at 05/18/13 1151  . heparin ADULT infusion 100 units/mL (25000 units/250 mL)  1,150 Units/hr Intravenous Continuous Belkys A Harrel Carina, MD 11.5 mL/hr at 05/18/13 2335 1,150 Units/hr at 05/18/13 2335  . hydrALAZINE (APRESOLINE) injection 10 mg  10 mg Intravenous Q4H PRN Berle Mull, MD      .  influenza vac split quadrivalent PF (FLUARIX) injection 0.5 mL  0.5 mL Intramuscular Tomorrow-1000 Tiffany P Fairing, RN      . multivitamin with minerals tablet 1 tablet  1 tablet Oral Daily Baird Lyons, RD   1 tablet at 05/18/13 1012  . ondansetron (ZOFRAN) injection 4 mg  4 mg Intravenous Q6H PRN Belkys A Harrel Carina, MD      . ondansetron (ZOFRAN) tablet 4 mg  4 mg Oral Q6H PRN Berle Mull, MD      . oxyCODONE (Oxy IR/ROXICODONE) immediate release tablet 5 mg  5 mg Oral Q4H PRN Berle Mull, MD   5 mg  at 05/17/13 0036  . pantoprazole (PROTONIX) injection 40 mg  40 mg Intravenous Q12H Berle Mull, MD   40 mg at 05/18/13 2204  . senna-docusate (Senokot-S) tablet 2 tablet  2 tablet Oral BID Gardiner Barefoot, NP   2 tablet at 05/18/13 2204  . sodium bicarbonate tablet 650 mg  650 mg Oral BID Windy Kalata, MD   650 mg at 05/18/13 2204  . sodium chloride 0.9 % injection 3 mL  3 mL Intravenous Q12H Berle Mull, MD   3 mL at 05/17/13 2045  . sorbitol 70 % solution 30 mL  30 mL Oral Daily Belkys A Harrel Carina, MD   30 mL at 05/18/13 1012  . tamsulosin (FLOMAX) capsule 0.4 mg  0.4 mg Oral QPC supper Berle Mull, MD   0.4 mg at 05/18/13 1713    OBJECTIVE: elderly African American man examined in bed Filed Vitals:   05/19/13 0500  BP: 133/66  Pulse: 57  Temp: 97.9 F (36.6 C)  Resp: 18     Body mass index is 22.52 kg/(m^2).    ECOG FS:2 - Symptomatic, <50% confined to bed  Limited exam today showed no rales/ rhonchi over the anterolateral lung fields, heart RRR, abd +BS   LAB RESULTS:  CMP     Component Value Date/Time   NA 138 05/18/2013 0440   K 4.8 05/18/2013 0440   CL 105 05/18/2013 0440   CO2 18* 05/18/2013 0440   GLUCOSE 174* 05/18/2013 0440   BUN 85* 05/18/2013 0440   CREATININE 4.09* 05/18/2013 0440   CALCIUM 8.8 05/18/2013 0440   PROT 6.6 05/17/2013 1030   ALBUMIN 2.4* 05/18/2013 0440   AST 25 05/17/2013 1030   ALT 21 05/17/2013 1030   ALKPHOS 54 05/17/2013 1030   BILITOT 1.3* 05/17/2013 1030   GFRNONAA 12* 05/18/2013 0440   GFRAA 14* 05/18/2013 0440    I No results found for this basename: SPEP, UPEP,  kappa and lambda light chains    Lab Results  Component Value Date   WBC 3.2* 05/18/2013   NEUTROABS 2.7 05/17/2013   HGB 8.3* 05/18/2013   HCT 23.4* 05/18/2013   MCV 93.6 05/18/2013   PLT 66* 05/18/2013    _0 @  No results found for this basename: LABCA2    No components found with this basename: LABCA125     Recent Labs Lab 05/16/13 1758  INR  1.31    Urinalysis    Component Value Date/Time   COLORURINE YELLOW 05/16/2013 2040   APPEARANCEUR CLOUDY* 05/16/2013 2040   LABSPEC 1.016 05/16/2013 2040   PHURINE 5.0 05/16/2013 2040   GLUCOSEU NEGATIVE 05/16/2013 2040   HGBUR NEGATIVE 05/16/2013 2040   BILIRUBINUR NEGATIVE 05/16/2013 2040   KETONESUR NEGATIVE 05/16/2013 2040   PROTEINUR 30* 05/16/2013 2040   UROBILINOGEN 0.2 05/16/2013 2040   NITRITE NEGATIVE  05/16/2013 2040   LEUKOCYTESUR NEGATIVE 05/16/2013 2040    STUDIES: US Renal  05/17/2013   CLINICAL DATA:  Left flank pain.  Renal failure.  EXAM: RENAL/URINARY TRACT ULTRASOUND COMPLETE  COMPARISON:  Ultrasound dated 05/18/2009  FINDINGS: Right Kidney:  Length: 9.3 cm. Diffuse increased echogenicity with several small calcifications in the upper pole. No hydronephrosis.  Left Kidney:  Length: 9.8 cm. Diffused increased echogenicity. No hydronephrosis.  Bladder:  The bladder is empty with a Foley catheter in place.  IMPRESSION: Echogenic renal parenchyma bilaterally consistent with renal medical disease, essentially unchanged since 05/18/2009. No obstruction.   Electronically Signed   By: Rozetta Nunnery M.D.   On: 05/17/2013 22:48   Dg Ankle Left Port  05/17/2013   CLINICAL DATA:  History of gout.  Swelling and pain.  EXAM: PORTABLE LEFT ANKLE - 2 VIEW  COMPARISON:  None.  FINDINGS: Diffuse soft tissue swelling. There is a tiny erosion along the inferior aspect of the medial malleolus. No evidence of an acute fracture. Calcaneal spur.  IMPRESSION: Tiny osseous erosion along the medial malleolus is in keeping with the given history of gout. Diffuse soft tissue swelling.   Electronically Signed   By: Lorin Picket M.D.   On: 05/17/2013 08:13   Dg Ankle Right Port  05/17/2013   CLINICAL DATA:  Swelling and pain.  History of gout.  EXAM: PORTABLE RIGHT ANKLE - 2 VIEW  COMPARISON:  None.  FINDINGS: There is a minimally displaced and transversely oriented fracture of the medial malleolus, indeterminate in age.  Diffuse soft tissue swelling. Talar dome appears grossly intact. Degenerative changes are seen in the midfoot. Calcaneal spurs.  IMPRESSION: Medial malleolar fracture is age indeterminate. Please correlate clinically. Diffuse soft tissue swelling.   Electronically Signed   By: Lorin Picket M.D.   On: 05/17/2013 08:11   Dg Abd Acute W/chest  05/16/2013   CLINICAL DATA:  Intermittent umbilical pain.  Occasional emesis.  EXAM: ACUTE ABDOMEN SERIES (ABDOMEN 2 VIEW & CHEST 1 VIEW)  COMPARISON:  None.  FINDINGS: Accounting for the low lung volumes and AP technique, there is borderline cardiomegaly. No edema.  No free intraperitoneal gas noted on the left-side-down lateral decubitus view. Several air-fluid levels are present in nondilated loops of small bowel. Gas and formed stool noted in the colon.  The L5 vertebra is partially sacralized.  IMPRESSION: 1. No dilated bowel. There is gas scattered in the small bowel with some air-fluid levels in small bowel, but not a necessarily abnormal number. Formed stool noted in the colon. Overall no specific bowel gas pattern abnormality is observed. 2. Borderline cardiomegaly.   Electronically Signed   By: Sherryl Barters M.D.   On: 05/16/2013 18:33    ASSESSMENT/ PLAN:: 80 y.o. year old [place] man with pancytopenia, adequate iron and folate stores but B-12 low at 160.Additional problems are acute on chronic renal injury and R LE DVT. Discussed with Dr Tyrell Antonio.  (1) sq B-12 QOD x3, then monthly indefinitely. Initial response can result in abrupt and severe hypokalemia-- follow potassium closely next 48 h  (2) sent intrinsic factor antibodies to confirm diagnosis of pernicious anemia  (3) anemia of renal disease may blunt reticulocyte response to B-12 supplementation -- epo per renal as needed (4) so long as platelets are >50K and there is no overt bleeding recommend unfractionated heparin for the DVT and transition to coumadin when stable  (5) SPEP/ UPEP/ kappa/  Lambda light chains pending  Patient wishes to keep all  his medical care in [place]. I am setting him up for follow up with me in about 3 weeks, to arrange for his monthly B-12 doses as the patient has no primary care MD. If he is coumadinized, he can be referred to our coumadin clinic for outpatient management. If protein studies suggest myeloma will reconsult.        Chauncey Cruel, MD   05/19/2013 6:20 AM

## 2013-05-19 NOTE — Progress Notes (Addendum)
Pharmacy Note-Anticoagulation  Pharmacy Consult :  -  80 y.o. male is currently on Heparin for DVT.  The Heparin infusion had been stopped following discovery of thrombosis surrounding the IV site.  IV therapy has been unable to restart IV in the other side. -  Lovenox will replace UFH at therapeutic doses, but because of his CrCl of ~ 16 ml/min, the doses will be renally adjusted.   Heparin Dosing Wt:  69 kg  Latest Labs : Hematology :  Recent Labs  05/16/13 1700 05/16/13 1758 05/18/13 0440 05/18/13 2223 05/19/13 0800  HGB 5.6*  --  8.3*  --  8.4*  HCT 15.5*  --  23.4*  --  23.0*  PLT 101*  --  66*  --  75*  APTT  --  30  --   --   --   LABPROT  --  16.0*  --   --   --   INR  --  1.31  --   --   --   HEPARINUNFRC  --   --   --  0.23* 0.61  CREATININE 4.26*  --  4.09*  --  3.49*   Estimated Creatinine Clearance: 16.2 ml/min (by C-G formula based on Cr of 3.49).   Lab Results  Component Value Date   INR 1.31 05/16/2013        HEPARINUNFRC 0.61 05/19/2013   HEPARINUNFRC 0.23* 05/18/2013        HGB 8.4* 05/19/2013   HGB 8.3* 05/18/2013   HGB 8.6* 05/17/2013    Current Medication[s] Include:  Infusion[s]: Infusions:  . Heparin, Currently discontinued due to loss of IV access. 1,150 Units/hr (05/19/13 1039)    Assessment :  Heparin level therapeutic prior to Heparin being halted due to discovered thrombosis surrounding the IV site   No bleeding complications observed.  Lovenox will be started at renally adjusted dosing schedule.  Heparin will be restarted at same rate and 8 hour heparin level reviewed.  Goal :  Lovenox Heparin levels 0.6 - 1 unit/ml at 4 hours after Lovenox dose.  Plan : 1. Lovenox 1 mg/kg q 24 hours, 70 mg sq q 24 hours.   Low Molecular Heparin Level will be due 4 hours after Lovenox dose. 2. CBC daily while on Lovenox. Monitor for bleeding complications, worsening thrombocytopenia.   Stramoski, Craig Guess, Pharm.D. 05/19/2013  11:30 AM     Addendum:  Lovenox dose given late.  Will defer low molecular weight heparin level for now.  Heide Guile, PharmD, BCPS Clinical Pharmacist Pager [phone]

## 2013-05-19 NOTE — Progress Notes (Signed)
Unable to advance I/O catheter per MD order. 2 other RN assessed and were unable to advance. Small amt of blood noted when catheter removed. MD on call K Baltazar Najjar notified.

## 2013-05-19 NOTE — Progress Notes (Signed)
Informed by Alroy Dust in from vascular lab that pt Lt AC with current iv site has a superficial thrombus.  Notified Dr. Lowella Fairy and instructed to change iv site.  IV team attempted IV sticks x2 unsuccessful pharmacy also MD aware and Notified Dr. Dyann Kief and instructed to start lovenox.  Gery Pray, RN

## 2013-05-19 NOTE — Progress Notes (Signed)
Saw note to draw low molecular wt hep.  Verified with Joenathan, Pharm. Instructed that it does not need to be drawn.  Francisca December, RN.

## 2013-05-19 NOTE — Evaluation (Signed)
Physical Therapy Evaluation Patient Details Name: Caleb Hayes MRN: [id number] DOB: December 22, 1929 Today's Date: 05/19/2013 Time: 1000-1037 PT Time Calculation (min): 37 min  PT Assessment / Plan / Recommendation History of Present Illness  The patient mentions that since last 2 weeks he has been having an episode of gout affecting bilateral knee and ankle and he has been having significant pain it due to that he has been inability to move or ambulate and since then he has been spending his time in his car. His daughters and granddaughters have been visiting him every now and then. Since last 2 weeks he also has decreased appetite and he has significant weight loss over last 6 months. He has been having exertional dyspnea since last one week progressively worsening. He denies any active bleeding or black color bowel movement. In fact he mentions he has been having constipation lasting for a week. Since last 2 days he has nausea and today he had an episode of vomiting without any blood. He complains of acid reflux and increased burping. He denies any nausea at this point. Denies any fever, chills, cough, chest pain, shortness of breath at rest, headache, syncope.  Clinical Impression  Pt ambulated to and from bathroom with RW and mod A, though was very fatigued after using toilet and would recommend +2 assist for safety for further ambulation. Pt with generalized weakness, will benefit from acute PT to increase strength and mobility to decrease burden of care at home and increase independence. Recommend HHPT and 24 hr supervision, PT to follow.    PT Assessment  Patient needs continued PT services    Follow Up Recommendations  Home health PT;Supervision/Assistance - 24 hour    Does the patient have the potential to tolerate intense rehabilitation      Barriers to Discharge        Equipment Recommendations  None recommended by PT    Recommendations for Other Services OT consult   Frequency  Min 3X/week    Precautions / Restrictions Precautions Precautions: Fall Precaution Comments: daughter reports he has really not ambulated in past 2 mos due to leg pain and weakness Restrictions Weight Bearing Restrictions: No RLE Weight Bearing: Weight bearing as tolerated LLE Weight Bearing: Weight bearing as tolerated Other Position/Activity Restrictions: pt with fracture of right medial malleolus though of indeterminate age, wearing brace   Pertinent Vitals/Pain No c/o pain      Mobility  Bed Mobility Overal bed mobility: Needs Assistance Bed Mobility: Supine to Sit;Sit to Supine Supine to sit: Min assist Sit to supine: +2 for physical assistance;Total assist General bed mobility comments: pt was able to get to EOB with only min A to gain upright posture. However, going back to bed, he was extremely fatigued from ambulation and required +2 tot A and was not following instructional cues Transfers Overall transfer level: Needs assistance Equipment used: Rolling walker (2 wheeled) Transfers: Sit to/from Stand Sit to Stand: Mod assist General transfer comment: mod A from bed and from toilet.  Ambulation/Gait Ambulation/Gait assistance: Mod assist Assistive device: Rolling walker (2 wheeled) Gait Pattern/deviations: Shuffle;Decreased stride length;Trunk flexed Gait velocity: very slow Gait velocity interpretation: Below normal speed for age/gender General Gait Details: frequent cues to stay within RW and lift chest. Pt ambulated with min A to bathroom but very fatigued after using toilet and required mod A to return to bed, recommend +2 for safety next ambulation. Bilateral knees buckling as pt approached bed.    Exercises  PT Diagnosis: Difficulty walking;Abnormality of gait;Generalized weakness  PT Problem List: Decreased strength;Decreased activity tolerance;Decreased balance;Decreased mobility;Decreased cognition;Decreased knowledge of use of DME;Decreased safety  awareness;Decreased knowledge of precautions PT Treatment Interventions: DME instruction;Gait training;Functional mobility training;Therapeutic activities;Therapeutic exercise;Balance training;Neuromuscular re-education;Patient/family education     PT Goals(Current goals can be found in the care plan section) Acute Rehab PT Goals Patient Stated Goal: return home PT Goal Formulation: With patient Time For Goal Achievement: 06/02/13 Potential to Achieve Goals: Fair  Visit Information  Last PT Received On: 05/19/13 Assistance Needed: +2 (ambulation safety) History of Present Illness: The patient mentions that since last 2 weeks he has been having an episode of gout affecting bilateral knee and ankle and he has been having significant pain it due to that he has been inability to move or ambulate and since then he has been spending his time in his car. His daughters and granddaughters have been visiting him every now and then. Since last 2 weeks he also has decreased appetite and he has significant weight loss over last 6 months. He has been having exertional dyspnea since last one week progressively worsening. He denies any active bleeding or black color bowel movement. In fact he mentions he has been having constipation lasting for a week. Since last 2 days he has nausea and today he had an episode of vomiting without any blood. He complains of acid reflux and increased burping. He denies any nausea at this point. Denies any fever, chills, cough, chest pain, shortness of breath at rest, headache, syncope.       Prior [name] expects to be discharged to:: Private residence Living Arrangements: Children Available Help at Discharge: Family;Available 24 hours/day Type of Home: House Home Access: Level entry Home Layout: One level Home Equipment: Crutches;Walker - 2 wheels Additional Comments: pt's daughter apparently works 3rd shift but pt has good family support  (11 living children) as well as friends and neighbors and reports he can have 24 hr assist Prior Function Level of Independence: Needs assistance Gait / Transfers Assistance Needed: had not been ambulating prior to admit, reported that last time he ambulated used crutches due to leg pain ADL's / Homemaking Assistance Needed: daughter has been bathing him and helping him dress Communication Communication: No difficulties    Cognition  Cognition Arousal/Alertness: Awake/alert Behavior During Therapy: WFL for tasks assessed/performed Overall Cognitive Status: Difficult to assess Area of Impairment: Following commands;Safety/judgement;Problem solving Memory: Decreased short-term memory Following Commands: Follows one step commands consistently;Follows multi-step commands consistently;Follows one step commands with increased time Safety/Judgement: Decreased awareness of deficits Problem Solving: Requires verbal cues;Difficulty sequencing;Slow processing General Comments: pt is poor historian, provided accurate history but with many gaps which daughter helped fill in. He did not follow all mobilty commands but may be partly due to fatigue because worsened as session went on. Also impulsive with fatigue and seemed to have decreased insight into limitations Difficult to assess due to:  (cognition deficit may be due to fatigue with mobility)    Extremity/Trunk Assessment Upper Extremity Assessment Upper Extremity Assessment: LUE deficits/detail;Defer to OT evaluation LUE Deficits / Details: RN reported thrombosis left IV site, Iv to be moved Lower Extremity Assessment Lower Extremity Assessment: Generalized weakness;RLE deficits/detail;LLE deficits/detail RLE Deficits / Details: grossly 3/5 throughout but fatigues very quickly with Spelter activity. Ankle brace on right ankle, reported no pain in standing LLE Deficits / Details: grossly 3/5 throughout but with quick fatigue Cervical / Trunk  Assessment Cervical / Trunk Assessment:  Kyphotic   Balance Balance Overall balance assessment: Needs assistance Sitting-balance support: Feet supported;No upper extremity supported Sitting balance-Leahy Scale: Fair Standing balance support: Bilateral upper extremity supported;During functional activity Standing balance-Leahy Scale: Poor Standing balance comment: unsteady in standing General Comments General comments (skin integrity, edema, etc.): blood on pad under pt and on leg near catheter, RN notifed  End of Session PT - End of Session Equipment Utilized During Treatment: Gait belt Activity Tolerance: Patient limited by fatigue Patient left: in bed;with call bell/phone within reach;with family/visitor present Nurse Communication: Mobility status  GP   Leighton Roach, [place]  [place], [place] 05/19/2013, 12:16 PM

## 2013-05-19 NOTE — Progress Notes (Signed)
Attempted to insert 55fr foley catheter, unable to advance catheter, patient complains of pain. Pt voided 150 cc, bladder scan >400. K Kirby notified and new order to place Coude catheter. Ronnette Hila, RN

## 2013-05-19 NOTE — Progress Notes (Signed)
[facility] PROGRESS NOTE  Randye Lobo Auer [id number] DOB: [birth year] DOA: 05/16/2013 PCP: No primary provider on file.  Assessment/Plan: 1-Acute on chronic renal failure. Patient with pancytopenia, concern for MM.  Renal US no evidence of obstruction.  Renal consulted.  -patient has refused HD access placement at this moment. -Cr improving -follow renal service rec's   2-Pancytopenia; Anemia. Has received 3 units of PRBC.  B 12 low at 160.  No significant elevation of bilirubin. Haptoglobin normal at 195.  -LDH 403 -UPEP, SPEP pending.  -iron supplement.  -B-12 supplement.  -Concern for MM, waiting results to r/o needs of BM -Now with DVT, Hematology consulted and ok to continue coumadin  3-B-12 deficiency; B-12 1000 mcg daily for 7 days; then weekly and subsequently monthly for repletion. -to follow with Dr. Jana Hakim at cancer center at discharge -intrinsic factor pending  4-History of gout; bilateral ankle pain, swelling : continue with  Colchicine, dose adjusted due to decrease renal GFR.  5-Constipation; continue sorbitol.  -small BM achieved with enema; will monitor  6-Right Medial malleolar fracture;  is age indeterminate; ortho consulted. Ortho think patient has right ankle with gouty arthropathy with possible old trauma (nondisplaced fracture) of the medial malleolus. Plan: ASO to the right ankle. PT for ambulation WBAT Bilateral LEs. ROM,stretching and strengthening to the ankles bilateral. -will need CIR VS SNF at discharge for rehab  7-Bilateral LE DVT; patient with renal failure, thrombocytopenia.  -continue coumadin and bridging -due to lack of access for heparin drip will use lovenox; dose to be adjusted by pharmacy.     Code Status: Full Code.  Family Communication: care discussed with daughter who was at bedside.  Disposition Plan: remain inpatient.    Consultants:  Nephrologist  Orthopedic.   VVS  Procedures:  US renal: no osbtruction;  changes suggesting chronic disease.  Antibiotics:  none  HPI/Subjective: Passing flatus; no CP or SOB. Patient tolerating CLD. Patient has refused AVF placement  Objective: Filed Vitals:   05/19/13 1300  BP: 129/62  Pulse: 71  Temp: 98.2 F (36.8 C)  Resp: 20    Intake/Output Summary (Last 24 hours) at 05/19/13 1945 Last data filed at 05/19/13 1700  Gross per 24 hour  Intake    480 ml  Output   2377 ml  Net  -1897 ml   Filed Weights   05/17/13 0442 05/18/13 0518 05/19/13 0500  Weight: 67.903 kg (149 lb 11.2 oz) 68.675 kg (151 lb 6.4 oz) 69.2 kg (152 lb 8.9 oz)    Exam:   General:  No distress.   Cardiovascular: S 1, S 2 RRR  Respiratory: no wheezing, no crackles   Abdomen: BS positive, no tenderness   Musculoskeletal: BL trace to 1+ LE edema.   Data Reviewed: Basic Metabolic Panel:  Recent Labs Lab 05/16/13 1700 05/17/13 1030 05/18/13 0440 05/19/13 0800  NA 134* 139 138 136*  K 4.0 4.8 4.8 3.9  CL 96 103 105 101  CO2 18* 18* 18* 19  GLUCOSE 98 121* 174* 117*  BUN 86* 89* 85* 79*  CREATININE 4.26* 4.34* 4.09* 3.49*  CALCIUM 9.7 9.2 8.8 8.9  PHOS  --   --  4.5 3.3   Liver Function Tests:  Recent Labs Lab 05/16/13 1700 05/17/13 1030 05/18/13 0440 05/19/13 0800  AST 33 25  --   --   ALT 26 21  --   --   ALKPHOS 60 54  --   --   BILITOT 1.0 1.3*  --   --  PROT 7.6 6.6  --   --   ALBUMIN 3.2* 2.6* 2.4* 2.3*   No results found for this basename: LIPASE, AMYLASE,  in the last 168 hours No results found for this basename: AMMONIA,  in the last 168 hours CBC:  Recent Labs Lab 05/16/13 1700 05/17/13 1030 05/17/13 1242 05/17/13 1937 05/18/13 0440 05/19/13 0800  WBC 3.4* 3.7* 3.6* 3.5* 3.2* 4.9  NEUTROABS 2.1 2.7  --   --   --   --   HGB 5.6* 7.3* 7.6* 8.6* 8.3* 8.4*  HCT 15.5* 19.8* 20.5* 23.4* 23.4* 23.0*  MCV 110.7* 98.0 97.6 93.2 93.6 94.7  PLT 101* 75* 80* 77* 66* 75*   Cardiac Enzymes:  Recent Labs Lab 05/16/13 1700   TROPONINI <0.30   BNP (last 3 results)  Recent Labs  05/16/13 1700  PROBNP 1290.0*    Studies: US Renal  05/17/2013   CLINICAL DATA:  Left flank pain.  Renal failure.  EXAM: RENAL/URINARY TRACT ULTRASOUND COMPLETE  COMPARISON:  Ultrasound dated 05/18/2009  FINDINGS: Right Kidney:  Length: 9.3 cm. Diffuse increased echogenicity with several small calcifications in the upper pole. No hydronephrosis.  Left Kidney:  Length: 9.8 cm. Diffused increased echogenicity. No hydronephrosis.  Bladder:  The bladder is empty with a Foley catheter in place.  IMPRESSION: Echogenic renal parenchyma bilaterally consistent with renal medical disease, essentially unchanged since 05/18/2009. No obstruction.   Electronically Signed   By: Rozetta Nunnery M.D.   On: 05/17/2013 22:48    Scheduled Meds: . [START ON 05/20/2013] colchicine  0.3 mg Oral Daily  . cyanocobalamin  1,000 mcg Intramuscular Daily  . darbepoetin (ARANESP) injection - NON-DIALYSIS  100 mcg Subcutaneous Q Wed-1800  . enoxaparin  70 mg Subcutaneous Q24H  . feeding supplement (ENSURE COMPLETE)  237 mL Oral BID BM  . feeding supplement (ENSURE)  1 Container Oral Q24H  . feeding supplement (RESOURCE BREEZE)  1 Container Oral BID WC  . influenza vac split quadrivalent PF  0.5 mL Intramuscular Tomorrow-1000  . multivitamin with minerals  1 tablet Oral Daily  . pantoprazole  40 mg Oral Q1200  . senna-docusate  2 tablet Oral BID  . sodium bicarbonate  650 mg Oral BID  . sodium chloride  3 mL Intravenous Q12H  . sorbitol  30 mL Oral Daily  . tamsulosin  0.4 mg Oral QPC supper   Continuous Infusions:    Principal Problem:   Symptomatic anemia Active Problems:   Acute-on-chronic kidney injury   Essential hypertension, benign   Gout   Urinary retention   Leg swelling   Poor appetite   Unspecified constipation    Time spent: < 35 minutes.     Zerita Boers  [facility] Pager [phone]. If 7PM-7AM, please contact  night-coverage at [email or web address], password [facility] 05/19/2013, 7:45 PM  LOS: 3 days

## 2013-05-19 NOTE — Progress Notes (Signed)
Right  Upper Extremity Vein Map    Cephalic  Segment Diameter Depth Comment  1. Axilla 2.106mm mm   2. Mid upper arm 9mm mm   3. Above AC 2.30mm mm   4. In [facility] 3.3mm mm   5. Below AC 3.22mm mm   6. Mid forearm 3.10mm mm   7. Wrist 2.78mm mm    mm mm    mm mm    mm mm      Left Upper Extremity Vein Map    Cephalic  Segment Diameter Depth Comment  1. Axilla 3.68mm mm   2. Mid upper arm 3.89mm mm   3. Above [facility] 3.39mm mm   4. In [facility] 4.57mm mm Thrombosis surrounding IV                            Basilic  Segment Diameter Depth Comment  1. Axilla 3.74mm 7.39mm   2. Mid upper arm 3.8mm 4.5mm   3. Above AC 3.15mm 4.55mm   4. In [facility] 4.43mm 4.68mm   5. Below AC 3.4mm 3.9mm   6. Mid forearm 2.32mm 2.44mm   7. Wrist 1.88mm 2.45mm     05/19/2013 10:34 AM Maudry Mayhew, RVT, RDCS, RDMS

## 2013-05-20 ENCOUNTER — Other Ambulatory Visit: Payer: Self-pay | Admitting: Oncology

## 2013-05-20 ENCOUNTER — Encounter: Payer: Self-pay | Admitting: Pharmacist

## 2013-05-20 DIAGNOSIS — I82409 Acute embolism and thrombosis of unspecified deep veins of unspecified lower extremity: Secondary | ICD-10-CM

## 2013-05-20 DIAGNOSIS — K5900 Constipation, unspecified: Secondary | ICD-10-CM

## 2013-05-20 DIAGNOSIS — R339 Retention of urine, unspecified: Secondary | ICD-10-CM

## 2013-05-20 LAB — UIFE/LIGHT CHAINS/TP QN, 24-HR UR
ALPHA 1 UR: DETECTED — AB
ALPHA 2 UR: DETECTED — AB
Albumin, U: DETECTED
Beta, Urine: DETECTED — AB
FREE KAPPA/LAMBDA RATIO: 7.39 ratio (ref 2.04–10.37)
Free Kappa Lt Chains,Ur: 53.2 mg/dL — ABNORMAL HIGH (ref 0.14–2.42)
Free Lambda Lt Chains,Ur: 7.2 mg/dL — ABNORMAL HIGH (ref 0.02–0.67)
Gamma Globulin, Urine: DETECTED — AB
Total Protein, Urine: 65.6 mg/dL

## 2013-05-20 LAB — CBC WITH DIFFERENTIAL/PLATELET
BASOS PCT: 0 % (ref 0–1)
Basophils Absolute: 0 10*3/uL (ref 0.0–0.1)
Eosinophils Absolute: 0 10*3/uL (ref 0.0–0.7)
Eosinophils Relative: 1 % (ref 0–5)
HEMATOCRIT: 23.8 % — AB (ref 39.0–52.0)
Hemoglobin: 8.7 g/dL — ABNORMAL LOW (ref 13.0–17.0)
LYMPHS ABS: 1.8 10*3/uL (ref 0.7–4.0)
Lymphocytes Relative: 43 % (ref 12–46)
MCH: 35.2 pg — ABNORMAL HIGH (ref 26.0–34.0)
MCHC: 36.6 g/dL — ABNORMAL HIGH (ref 30.0–36.0)
MCV: 96.4 fL (ref 78.0–100.0)
MONOS PCT: 9 % (ref 3–12)
Monocytes Absolute: 0.4 10*3/uL (ref 0.1–1.0)
NEUTROS ABS: 1.9 10*3/uL (ref 1.7–7.7)
Neutrophils Relative %: 47 % (ref 43–77)
PLATELETS: 91 10*3/uL — AB (ref 150–400)
RBC: 2.47 MIL/uL — AB (ref 4.22–5.81)
RDW: 22.6 % — ABNORMAL HIGH (ref 11.5–15.5)
WBC: 4.1 10*3/uL (ref 4.0–10.5)

## 2013-05-20 LAB — RENAL FUNCTION PANEL
ALBUMIN: 2.4 g/dL — AB (ref 3.5–5.2)
BUN: 70 mg/dL — ABNORMAL HIGH (ref 6–23)
CHLORIDE: 102 meq/L (ref 96–112)
CO2: 21 mEq/L (ref 19–32)
CREATININE: 3.35 mg/dL — AB (ref 0.50–1.35)
Calcium: 8.7 mg/dL (ref 8.4–10.5)
GFR calc Af Amer: 18 mL/min — ABNORMAL LOW (ref >90)
GFR, EST NON AFRICAN AMERICAN: 16 mL/min — AB (ref >90)
Glucose, Bld: 100 mg/dL — ABNORMAL HIGH (ref 70–99)
Phosphorus: 3.4 mg/dL (ref 2.3–4.6)
Potassium: 4 mEq/L (ref 3.7–5.3)
Sodium: 137 mEq/L (ref 137–147)

## 2013-05-20 LAB — RETICULOCYTES
RBC.: 2.47 MIL/uL — AB (ref 4.22–5.81)
Retic Count, Absolute: 29.6 10*3/uL (ref 19.0–186.0)
Retic Ct Pct: 1.2 % (ref 0.4–3.1)

## 2013-05-20 LAB — INTRINSIC FACTOR ANTIBODIES: INTRINSIC FACTOR: POSITIVE

## 2013-05-20 MED ORDER — FERROUS SULFATE 325 (65 FE) MG PO TABS
325.0000 mg | ORAL_TABLET | Freq: Three times a day (TID) | ORAL | Status: DC
  Administered 2013-05-20 – 2013-05-21 (×2): 325 mg via ORAL
  Filled 2013-05-20 (×6): qty 1

## 2013-05-20 MED ORDER — WARFARIN - PHARMACIST DOSING INPATIENT
Freq: Every day | Status: DC
  Administered 2013-05-20 – 2013-05-21 (×2)

## 2013-05-20 MED ORDER — WARFARIN SODIUM 2.5 MG PO TABS
2.5000 mg | ORAL_TABLET | ORAL | Status: AC
  Administered 2013-05-20: 2.5 mg via ORAL
  Filled 2013-05-20: qty 1

## 2013-05-20 MED ORDER — WARFARIN VIDEO
1.0000 | Freq: Once | Status: AC
  Administered 2013-05-20: 1

## 2013-05-20 MED ORDER — COUMADIN BOOK
1.0000 | Freq: Once | Status: AC
  Administered 2013-05-20: 1
  Filled 2013-05-20: qty 1

## 2013-05-20 NOTE — Progress Notes (Signed)
Physical Therapy Treatment Patient Details Name: Caleb Hayes MRN: [id number] DOB: November 17, 1929 Today's Date: 05/20/2013 Time: [id number] PT Time Calculation (min): 21 min  PT Assessment / Plan / Recommendation  History of Present Illness The patient mentions that since last 2 weeks he has been having an episode of gout affecting bilateral knee and ankle and he has been having significant pain it due to that he has been inability to move or ambulate and since then he has been spending his time in his car. His daughters and granddaughters have been visiting him every now and then. Since last 2 weeks he also has decreased appetite and he has significant weight loss over last 6 months. He has been having exertional dyspnea since last one week progressively worsening. He denies any active bleeding or black color bowel movement. In fact he mentions he has been having constipation lasting for a week. Since last 2 days he has nausea and today he had an episode of vomiting without any blood. He complains of acid reflux and increased burping. He denies any nausea at this point. Denies any fever, chills, cough, chest pain, shortness of breath at rest, headache, syncope.   PT Comments   Pt admitted with above. Pt currently with functional limitations due to balance and endurance deficits. Pt will benefit from skilled PT to increase their independence and safety with mobility to allow discharge to the venue listed below.   Follow Up Recommendations  Home health PT;Supervision/Assistance - 24 hour     Does the patient have the potential to tolerate intense rehabilitation     Barriers to Discharge        Equipment Recommendations  Gait belt    Recommendations for Other Services OT consult  Frequency Min 3X/week   Progress towards PT Goals Progress towards PT goals: Progressing toward goals  Plan Current plan remains appropriate    Precautions / Restrictions Precautions Precautions: Fall Precaution  Comments: daughter reports he has really not ambulated in past 2 mos due to leg pain and weakness Restrictions Weight Bearing Restrictions: No RLE Weight Bearing: Weight bearing as tolerated LLE Weight Bearing: Weight bearing as tolerated Other Position/Activity Restrictions: pt with fracture of right medial malleolus though of indeterminate age, wearing brace   Pertinent Vitals/Pain VSS, Bil knee pain per pt.    Mobility  Bed Mobility Overal bed mobility: Needs Assistance Bed Mobility: Supine to Sit Supine to sit: Min assist General bed mobility comments: pt was able to get to EOB with only min A to gain upright posture. Transfers Overall transfer level: Needs assistance Equipment used: Rolling walker (2 wheeled) Transfers: Sit to/from Stand Sit to Stand: Mod assist General transfer comment: Cues for hand placement and assist for power up and to control descent.  Also max cues for safety. Ambulation/Gait Ambulation/Gait assistance: Mod assist Ambulation Distance (Feet): 30 Feet (20 feet and 10 feet) Assistive device: Rolling walker (2 wheeled) Gait Pattern/deviations: Shuffle;Decreased stride length;Trunk flexed Gait velocity: very slow Gait velocity interpretation: Below normal speed for age/gender General Gait Details: frequent cues to stay within RW and lift chest. Pt ambulated with min Afor first two steps and then needed mod assist as pt unable to tolerate full weight on right LE due to "sharp pain hits him".  Needed mod assist to maintain standing.  Pt continue to ambulate to door and back to bed with continued antalgic gait needing mod assist for stability. Bilateral knees flexed throughout worsening with fatigue.  Once rested on bed, ambulated with  RW around bed to sit in recliner.  Spoke with daughter about her comfort in assisting pt at home and instructed her she would need a gait belt if they go home.  She is aware to purchase at gift shop.      Exercises General Exercises  - Lower Extremity Ankle Circles/Pumps: AROM;Both;10 reps;Seated Long Arc Quad: AROM;Both;10 reps;Seated Hip Flexion/Marching: AROM;Both;10 reps;Seated   PT Diagnosis:    PT Problem List:   PT Treatment Interventions:     PT Goals (current goals can now be found in the care plan section)    Visit Information  Last PT Received On: 05/20/13 Assistance Needed: +1 History of Present Illness: The patient mentions that since last 2 weeks he has been having an episode of gout affecting bilateral knee and ankle and he has been having significant pain it due to that he has been inability to move or ambulate and since then he has been spending his time in his car. His daughters and granddaughters have been visiting him every now and then. Since last 2 weeks he also has decreased appetite and he has significant weight loss over last 6 months. He has been having exertional dyspnea since last one week progressively worsening. He denies any active bleeding or black color bowel movement. In fact he mentions he has been having constipation lasting for a week. Since last 2 days he has nausea and today he had an episode of vomiting without any blood. He complains of acid reflux and increased burping. He denies any nausea at this point. Denies any fever, chills, cough, chest pain, shortness of breath at rest, headache, syncope.    Subjective Data  Subjective: "I feel so tired and weak."   Cognition  Cognition Arousal/Alertness: Awake/alert Behavior During Therapy: WFL for tasks assessed/performed Overall Cognitive Status: Difficult to assess Area of Impairment: Following commands;Safety/judgement;Problem solving Memory: Decreased short-term memory Following Commands: Follows one step commands consistently;Follows multi-step commands consistently;Follows one step commands with increased time Safety/Judgement: Decreased awareness of deficits Problem Solving: Requires verbal cues;Difficulty sequencing;Slow  processing General Comments: Impulsive and lack of insight    Balance  Balance Overall balance assessment: Needs assistance;History of Falls Sitting-balance support: Bilateral upper extremity supported;Feet supported Sitting balance-Leahy Scale: Fair Standing balance support: Bilateral upper extremity supported;During functional activity Standing balance-Leahy Scale: Poor Standing balance comment: Unsteady with and without RW in standing due to pain and weakness in knees as well as poor overall postural stabiity.    End of Session PT - End of Session Equipment Utilized During Treatment: Gait belt Activity Tolerance: Patient limited by fatigue Patient left: in chair;with call bell/phone within reach;with family/visitor present Nurse Communication: Mobility status   GP     INGOLD,Lisa Blakeman 05/20/2013, 4:30 PM [facility] Acute Rehabilitation [phone] (pager)

## 2013-05-20 NOTE — Progress Notes (Signed)
Urinary catheter removed at 1014 today. Patient still has not urinated at this time. Bladder scan performed showing >357ml. Stood patient up, turned on water to try to get patient to urinate. Patient states he feels no urge to pee. Dr. Dyann Kief notified and to place orders for Coude catheter.

## 2013-05-20 NOTE — Progress Notes (Signed)
[facility] PROGRESS NOTE  Caleb Hayes [id number] DOB: [birth year] DOA: 05/16/2013 PCP: No primary provider on file.  Assessment/Plan: 1-Acute on chronic renal failure. Patient with stage 4-5 at baseline. Had pancytopenia, concern for MM (but UPEP/SPEP neg for M spike).  Renal US no evidence of obstruction.  -patient has refused HD access placement at this moment. -Cr improving/returning to chronic baseline -follow up with renal as an outpatient  2-Pancytopenia; Anemia. Has received 3 units of PRBC.  B 12 low at 160.  No significant elevation of bilirubin. Haptoglobin normal at 195 and LDH 403 -UPEP, SPEP w/p M spike (making less likely MM).  -continue iron and B12 supplementation.  -will follow with Hem/Onc as an outpatient (Dr. Jana Hakim) -continue coumadin; no overt bleeding appreciated  3-B-12 deficiency; B-12 1000 mcg daily for 7 days; then weekly and subsequently monthly for repletion. -to follow with Dr. Jana Hakim at cancer center at discharge -intrinsic factor pending  4-History of gout; bilateral ankle pain, swelling : continue with  Colchicine, dose adjusted due to decrease renal GFR.  5-Constipation; continue sorbitol.  -good BM with current regimen; will continue and monitor  6-Right Medial malleolar fracture;  is age indeterminate; ortho consulted. Ortho think patient has right ankle with gouty arthropathy with possible old trauma (nondisplaced fracture) of the medial malleolus. Plan: ASO to the right ankle. PT for ambulation WBAT Bilateral LEs. ROM,stretching and strengthening to the ankles bilateral. -will be discharged home with home health services  7-Bilateral LE DVT; patient with renal failure, thrombocytopenia.  -continue coumadin and bridging -due to lack of access for heparin drip will continue lovenox; dose to be adjusted by pharmacy. Marland Kitchen  8-urinary retention and BPH -continue flomax -will attempt voiding trial  Code Status: Full Code.  Family  Communication: care discussed with daughter who was at bedside.  Disposition Plan: remain inpatient.    Consultants:  Nephrologist  Orthopedic.   VVS  Procedures:  US renal: no osbtruction; changes suggesting chronic disease.  Antibiotics:  none  HPI/Subjective: Positive BM; no CP or SOB. Patient tolerating CLD; will advance to renal diet  Objective: Filed Vitals:   05/20/13 1500  BP: 122/62  Pulse: 78  Temp: 98 F (36.7 C)  Resp: 18    Intake/Output Summary (Last 24 hours) at 05/20/13 1559 Last data filed at 05/20/13 1500  Gross per 24 hour  Intake    963 ml  Output   1125 ml  Net   -162 ml   Filed Weights   05/18/13 0518 05/19/13 0500 05/20/13 0509  Weight: 68.675 kg (151 lb 6.4 oz) 69.2 kg (152 lb 8.9 oz) 70.035 kg (154 lb 6.4 oz)    Exam:   General:  No distress.   Cardiovascular: S 1, S 2 RRR  Respiratory: no wheezing, no crackles   Abdomen: BS positive, no tenderness   Musculoskeletal: BL trace to 1+ LE edema.   Data Reviewed: Basic Metabolic Panel:  Recent Labs Lab 05/16/13 1700 05/17/13 1030 05/18/13 0440 05/19/13 0800 05/20/13 0610  NA 134* 139 138 136* 137  K 4.0 4.8 4.8 3.9 4.0  CL 96 103 105 101 102  CO2 18* 18* 18* 19 21  GLUCOSE 98 121* 174* 117* 100*  BUN 86* 89* 85* 79* 70*  CREATININE 4.26* 4.34* 4.09* 3.49* 3.35*  CALCIUM 9.7 9.2 8.8 8.9 8.7  PHOS  --   --  4.5 3.3 3.4   Liver Function Tests:  Recent Labs Lab 05/16/13 1700 05/17/13 1030 05/18/13 0440 05/19/13 0800  05/20/13 0610  AST 33 25  --   --   --   ALT 26 21  --   --   --   ALKPHOS 60 54  --   --   --   BILITOT 1.0 1.3*  --   --   --   PROT 7.6 6.6  --   --   --   ALBUMIN 3.2* 2.6* 2.4* 2.3* 2.4*   CBC:  Recent Labs Lab 05/16/13 1700 05/17/13 1030 05/17/13 1242 05/17/13 1937 05/18/13 0440 05/19/13 0800 05/20/13 0610  WBC 3.4* 3.7* 3.6* 3.5* 3.2* 4.9 4.1  NEUTROABS 2.1 2.7  --   --   --   --  1.9  HGB 5.6* 7.3* 7.6* 8.6* 8.3* 8.4* 8.7*   HCT 15.5* 19.8* 20.5* 23.4* 23.4* 23.0* 23.8*  MCV 110.7* 98.0 97.6 93.2 93.6 94.7 96.4  PLT 101* 75* 80* 77* 66* 75* 91*   Cardiac Enzymes:  Recent Labs Lab 05/16/13 1700  TROPONINI <0.30   BNP (last 3 results)  Recent Labs  05/16/13 1700  PROBNP 1290.0*    Studies: No results found.  Scheduled Meds: . colchicine  0.3 mg Oral Daily  . cyanocobalamin  1,000 mcg Intramuscular Daily  . darbepoetin (ARANESP) injection - NON-DIALYSIS  100 mcg Subcutaneous Q Wed-1800  . enoxaparin  70 mg Subcutaneous Q24H  . feeding supplement (ENSURE COMPLETE)  237 mL Oral BID BM  . feeding supplement (ENSURE)  1 Container Oral Q24H  . feeding supplement (RESOURCE BREEZE)  1 Container Oral BID WC  . influenza vac split quadrivalent PF  0.5 mL Intramuscular Tomorrow-1000  . multivitamin with minerals  1 tablet Oral Daily  . pantoprazole  40 mg Oral Q1200  . senna-docusate  2 tablet Oral BID  . sodium bicarbonate  650 mg Oral BID  . sodium chloride  3 mL Intravenous Q12H  . sorbitol  30 mL Oral Daily  . tamsulosin  0.4 mg Oral QPC supper  . Warfarin - Pharmacist Dosing Inpatient   Does not apply q1800   Continuous Infusions:    Principal Problem:   Symptomatic anemia Active Problems:   Acute-on-chronic kidney injury   Essential hypertension, benign   Gout   Urinary retention   Leg swelling   Poor appetite   Unspecified constipation    Time spent: < 35 minutes.     Zerita Boers  [facility] Pager [phone]. If 7PM-7AM, please contact night-coverage at [email or web address], password [facility] 05/20/2013, 3:59 PM  LOS: 4 days

## 2013-05-20 NOTE — Progress Notes (Signed)
S: No new CO O:BP 134/63  Pulse 61  Temp(Src) 97.8 F (36.6 C) (Oral)  Resp 18  Ht 5\' 9"  (1.753 m)  Wt 70.035 kg (154 lb 6.4 oz)  BMI 22.79 kg/m2  SpO2 100%  Intake/Output Summary (Last 24 hours) at 05/20/13 0903 Last data filed at 05/20/13 [id number]  Gross per 24 hour  Intake 925.58 ml  Output   1851 ml  Net -925.42 ml   Weight change: 0.835 kg (1 lb 13.5 oz) [id number] and alert CVS:RRR Resp:clear Abd:+ BS NTND Ext: tr edema NEURO:CNI Ox3 No asterixis   . colchicine  0.3 mg Oral Daily  . cyanocobalamin  1,000 mcg Intramuscular Daily  . darbepoetin (ARANESP) injection - NON-DIALYSIS  100 mcg Subcutaneous Q Wed-1800  . enoxaparin  70 mg Subcutaneous Q24H  . feeding supplement (ENSURE COMPLETE)  237 mL Oral BID BM  . feeding supplement (ENSURE)  1 Container Oral Q24H  . feeding supplement (RESOURCE BREEZE)  1 Container Oral BID WC  . influenza vac split quadrivalent PF  0.5 mL Intramuscular Tomorrow-1000  . multivitamin with minerals  1 tablet Oral Daily  . pantoprazole  40 mg Oral Q1200  . senna-docusate  2 tablet Oral BID  . sodium bicarbonate  650 mg Oral BID  . sodium chloride  3 mL Intravenous Q12H  . sorbitol  30 mL Oral Daily  . tamsulosin  0.4 mg Oral QPC supper   No results found. BMET    Component Value Date/Time   NA 137 05/20/2013 0610   K 4.0 05/20/2013 0610   CL 102 05/20/2013 0610   CO2 21 05/20/2013 0610   GLUCOSE 100* 05/20/2013 0610   BUN 70* 05/20/2013 0610   CREATININE 3.35* 05/20/2013 0610   CALCIUM 8.7 05/20/2013 0610   GFRNONAA 16* 05/20/2013 0610   GFRAA 18* 05/20/2013 0610   CBC    Component Value Date/Time   WBC 4.1 05/20/2013 0610   RBC 2.47* 05/20/2013 0610   RBC 2.47* 05/20/2013 0610   HGB 8.7* 05/20/2013 0610   HCT 23.8* 05/20/2013 0610   PLT 91* 05/20/2013 0610   MCV 96.4 05/20/2013 0610   MCH 35.2* 05/20/2013 0610   MCHC 36.6* 05/20/2013 0610   RDW 22.6* 05/20/2013 0610   LYMPHSABS 1.8 05/20/2013 0610   MONOABS 0.4 05/20/2013 0610   EOSABS  0.0 05/20/2013 0610   BASOSABS 0.0 05/20/2013 0610     Assessment: 1. CKD 4/5.  US show echogenic kidneys confirming chronicity. His Scr in 2011 was 2.3.  Scr sl lower. SPEP no monoclonal protein 2. Anemia 3. Hx HTN 4. Pancytopenia, suspicious for myeloma 5. DVT Plan: 1.  DC foley and give another voiding trial 2. He still does not not want to consider HD and that is fine.  There certainly is no urgency at this time. 3. I will sign off and my office will call him for FU appt in 4-6 wks. 4.  I would use colchicine only PRN for gout flares 5.  Will see how Hg responds to B12 replacement before committing to outpt EPO   Caleb Hayes T

## 2013-05-20 NOTE — Progress Notes (Signed)
Daughter, Apolonio Schneiders, at bedside. Discussed Coumdain education with patient and family. Discussed INR, diet restrictions, signs and symptoms of bleeding, follow up appointments, and lab work. Daughter able to use teach back with some assistance. Went over Coumadin booklet and "Understanding Blood Thinners" video playing for patient and family.

## 2013-05-20 NOTE — Progress Notes (Signed)
New [facility] Coumadin clinic consult from Dr. Jana Hakim DX: DVT Duration of Anticoag: 6 mos; target end date (per Dr. Jana Hakim) = 11/09/13 Risk factors: anemia, CKD (not on HD) Goal INR = 2-3 Pt will be discharged from [facility] on 05/21/13.  He will be on Lovenox & Coumadin (doses to be determined) at discharge. He got 1st dose of Coumadin = 2.5 mg on 05/20/13. 1st appt at [facility] 05/26/13 at 10 am for lab; 10:15 am for CC.  This information was communicated over the phone today to Dr. Bridgett Larsson (pager [phone]) & he'll let pt know of these appt. Kennith Center, Pharm.D., CPP 05/20/2013@4 :51 PM

## 2013-05-20 NOTE — Progress Notes (Signed)
Coumadin education booklet at bedside with patient. Awaiting for patient's daughter to return to room to show Coumadin video.

## 2013-05-20 NOTE — Consult Note (Signed)
Pharmacy Note-Anticoagulation  Pharmacy Consult :  80 y.o. male is currently on Coumadin with Lovenox bridging for DVT.   Latest Labs : Hematology :  Recent Labs  05/17/13 1030 05/17/13 1242 05/17/13 1937 05/18/13 0440 05/18/13 2223 05/19/13 0800 05/20/13 0610  HGB 7.3* 7.6* 8.6* 8.3*  --  8.4* 8.7*  HCT 19.8* 20.5* 23.4* 23.4*  --  23.0* 23.8*  PLT 75* 80* 77* 66*  --  75* 91*  HEPARINUNFRC  --   --   --   --  0.23* 0.61  --   CREATININE 4.34*  --   --  4.09*  --  3.49* 3.35*    Lab Results  Component Value Date   INR 1.31 05/16/2013        HGB 8.7* 05/20/2013   HGB 8.4* 05/19/2013   HGB 8.3* 05/18/2013    Current Medication[s] Include: Medication PTA: No prescriptions prior to admission   Scheduled:  Scheduled:  . colchicine  0.3 mg Oral Daily  . cyanocobalamin  1,000 mcg Intramuscular Daily  . darbepoetin (ARANESP) injection - NON-DIALYSIS  100 mcg Subcutaneous Q Wed-1800  . enoxaparin  70 mg Subcutaneous Q24H  . feeding supplement (ENSURE COMPLETE)  237 mL Oral BID BM  . feeding supplement (ENSURE)  1 Container Oral Q24H  . feeding supplement (RESOURCE BREEZE)  1 Container Oral BID WC  . influenza vac split quadrivalent PF  0.5 mL Intramuscular Tomorrow-1000  . multivitamin with minerals  1 tablet Oral Daily  . pantoprazole  40 mg Oral Q1200  . senna-docusate  2 tablet Oral BID  . sodium bicarbonate  650 mg Oral BID  . sodium chloride  3 mL Intravenous Q12H  . sorbitol  30 mL Oral Daily  . tamsulosin  0.4 mg Oral QPC supper    Assessment :  Hematology reviewed patient's pancytopenia and anemia and possible MM.  S/P 3 units PRBC's.  OK to Begin Coumadin as Plts > 50 k.    Patient is > 45 yrs old, LDH 403, Albumin < 2.5 place patient at high risk for bleeding from Coumadin.  Will begin Coumadin at lower dose.  Last INR 1.31, Hgb 8.7, Plts 91.  Lovenox replaced UFH yesterday due to loss of IV access.  Following the initial Lovenox dose, Lovenox level not  drawn yesterday as ordered.  Will redraw Lovenox level as indicated.  No bleeding complications observed.  Goal :  INR goal is 2-3    Lovenox Anti-Xa level 0.6-1 units/ml 4hrs after LMWH dose given .  Plan : 1. Lovenox bridging for 5 days for treatment of DVT. 2. Begin Coumadin 2.5 mg x 1 now, dose reduced due to risk factors for Coumadin toxicity. 3. Daily INR's, CBC. Monitor for bleeding complications.  LMWH levels as indicated. 4. Begin Coumadin discharge instructions.  Bretton Tandy, Craig Guess, Pharm.D. 05/20/2013  9:56 AM

## 2013-05-20 NOTE — Progress Notes (Signed)
UR completed Dantrell Schertzer K. Tasia Liz, RN, BSN, MSHL, CCM  05/20/2013 1:47 PM

## 2013-05-21 ENCOUNTER — Other Ambulatory Visit: Payer: Self-pay | Admitting: Oncology

## 2013-05-21 ENCOUNTER — Telehealth: Payer: Self-pay | Admitting: Oncology

## 2013-05-21 LAB — RENAL FUNCTION PANEL
Albumin: 2.3 g/dL — ABNORMAL LOW (ref 3.5–5.2)
BUN: 59 mg/dL — ABNORMAL HIGH (ref 6–23)
CALCIUM: 8.6 mg/dL (ref 8.4–10.5)
CO2: 22 meq/L (ref 19–32)
Chloride: 101 mEq/L (ref 96–112)
Creatinine, Ser: 2.95 mg/dL — ABNORMAL HIGH (ref 0.50–1.35)
GFR calc Af Amer: 21 mL/min — ABNORMAL LOW (ref >90)
GFR calc non Af Amer: 19 mL/min — ABNORMAL LOW (ref >90)
GLUCOSE: 119 mg/dL — AB (ref 70–99)
PHOSPHORUS: 2.8 mg/dL (ref 2.3–4.6)
Potassium: 3.9 mEq/L (ref 3.7–5.3)
Sodium: 137 mEq/L (ref 137–147)

## 2013-05-21 LAB — CBC WITH DIFFERENTIAL/PLATELET
Basophils Absolute: 0 10*3/uL (ref 0.0–0.1)
Basophils Relative: 0 % (ref 0–1)
EOS ABS: 0.1 10*3/uL (ref 0.0–0.7)
EOS PCT: 3 % (ref 0–5)
HCT: 24.2 % — ABNORMAL LOW (ref 39.0–52.0)
Hemoglobin: 8.6 g/dL — ABNORMAL LOW (ref 13.0–17.0)
Lymphocytes Relative: 38 % (ref 12–46)
Lymphs Abs: 1.7 10*3/uL (ref 0.7–4.0)
MCH: 34.1 pg — AB (ref 26.0–34.0)
MCHC: 35.5 g/dL (ref 30.0–36.0)
MCV: 96 fL (ref 78.0–100.0)
MONO ABS: 0.5 10*3/uL (ref 0.1–1.0)
Monocytes Relative: 10 % (ref 3–12)
NEUTROS PCT: 49 % (ref 43–77)
Neutro Abs: 2.2 10*3/uL (ref 1.7–7.7)
Platelets: 100 10*3/uL — ABNORMAL LOW (ref 150–400)
RBC: 2.52 MIL/uL — AB (ref 4.22–5.81)
RDW: 21.5 % — ABNORMAL HIGH (ref 11.5–15.5)
WBC: 4.5 10*3/uL (ref 4.0–10.5)

## 2013-05-21 LAB — RETICULOCYTES
RBC.: 2.52 MIL/uL — ABNORMAL LOW (ref 4.22–5.81)
Retic Count, Absolute: 65.5 10*3/uL (ref 19.0–186.0)
Retic Ct Pct: 2.6 % (ref 0.4–3.1)

## 2013-05-21 LAB — PROTIME-INR
INR: 1.14 (ref 0.00–1.49)
Prothrombin Time: 14.4 seconds (ref 11.6–15.2)

## 2013-05-21 MED ORDER — WARFARIN SODIUM 2.5 MG PO TABS: 2.5000 mg | ORAL_TABLET | Freq: Once | ORAL | Status: DC

## 2013-05-21 MED ORDER — ENSURE COMPLETE PO LIQD: 237.0000 mL | Freq: Two times a day (BID) | ORAL | Status: DC

## 2013-05-21 MED ORDER — COUMADIN BOOK
1.0000 | Freq: Once | Status: DC
  Filled 2013-05-21: qty 1

## 2013-05-21 MED ORDER — SENNOSIDES-DOCUSATE SODIUM 8.6-50 MG PO TABS: 2.0000 | ORAL_TABLET | Freq: Two times a day (BID) | ORAL | Status: DC

## 2013-05-21 MED ORDER — WARFARIN VIDEO: 1.0000 | Freq: Once | Status: DC

## 2013-05-21 MED ORDER — PANTOPRAZOLE SODIUM 40 MG PO TBEC: 40.0000 mg | DELAYED_RELEASE_TABLET | Freq: Every day | ORAL | Status: DC

## 2013-05-21 MED ORDER — ADULT MULTIVITAMIN W/MINERALS CH: 1.0000 | ORAL_TABLET | Freq: Every day | ORAL | Status: DC

## 2013-05-21 MED ORDER — COLCHICINE 0.6 MG PO TABS: 0.3000 mg | ORAL_TABLET | Freq: Every day | ORAL | Status: DC

## 2013-05-21 MED ORDER — WARFARIN SODIUM 2.5 MG PO TABS
2.5000 mg | ORAL_TABLET | Freq: Once | ORAL | Status: AC
  Administered 2013-05-21: 2.5 mg via ORAL
  Filled 2013-05-21: qty 1

## 2013-05-21 MED ORDER — FERROUS SULFATE 325 (65 FE) MG PO TABS: 325.0000 mg | ORAL_TABLET | Freq: Three times a day (TID) | ORAL | Status: DC

## 2013-05-21 MED ORDER — CYANOCOBALAMIN 1000 MCG/ML IJ SOLN: 1000.0000 ug | INTRAMUSCULAR | Status: DC

## 2013-05-21 MED ORDER — SODIUM BICARBONATE 650 MG PO TABS: 650.0000 mg | ORAL_TABLET | Freq: Two times a day (BID) | ORAL | Status: DC

## 2013-05-21 MED ORDER — TAMSULOSIN HCL 0.4 MG PO CAPS: 0.4000 mg | ORAL_CAPSULE | Freq: Every day | ORAL | Status: DC

## 2013-05-21 MED ORDER — ENSURE PUDDING PO PUDG: 1.0000 | ORAL | Status: DC

## 2013-05-21 MED ORDER — ENOXAPARIN SODIUM 80 MG/0.8ML ~~LOC~~ SOLN: 70.0000 mg | SUBCUTANEOUS | Status: DC

## 2013-05-21 NOTE — Telephone Encounter (Signed)
C/D 05/21/13 for appt. 06/18/13

## 2013-05-21 NOTE — Discharge Summary (Signed)
Physician Discharge Summary  Caleb Hayes [id number] DOB: [birth year] DOA: 05/16/2013  PCP: No primary provider on file.  Admit date: 05/16/2013 Discharge date: 05/21/2013  Time spent: >30 minutes  Recommendations for Outpatient Follow-up:  1. BMET to follow electrolytes and renal function. 2. CBC to follow Hgb trend. 3. Patient will follow with renal service and with Hem/Onc as an outpatient for further evaluation and treatment. 4. Urology service will follow him in 1 week for urinary retention. 5. Follow results of intrinsic factor   Discharge Diagnoses:  Principal Problem:   Symptomatic anemia Active Problems:   Acute-on-chronic kidney injury   Essential hypertension, benign   Gout   Urinary retention   Leg swelling   Poor appetite   Unspecified constipation   Discharge Condition: stable and improved. Discharge home with [facility] services and family care.  Diet recommendation: low sodium/ renal diet   Filed Weights   05/19/13 0500 05/20/13 0509 05/21/13 0634  Weight: 69.2 kg (152 lb 8.9 oz) 70.035 kg (154 lb 6.4 oz) 69.582 kg (153 lb 6.4 oz)    History of present illness:  80 y.o. male with Past medical history of hypertension, gout.  The patient is coming from home.  The patient was brought in by his daughters. The patient mentions that since last 2 weeks he has been having an episode of gout affecting bilateral knee and ankle and he has been having significant pain it due to that he has been inability to move or ambulate and since then he has been spending his time in his car. His daughters and granddaughters have been visiting him every now and then. Since last 2 weeks he also has decreased appetite and he has significant weight loss over last 6 months. He has been having exertional dyspnea since last one week progressively worsening. He denies any active bleeding or black color bowel movement. In fact he mentions he has been having constipation lasting for a week. Since  last 2 days he has nausea and today he had an episode of vomiting without any blood. He complains of acid reflux and increased burping. He denies any nausea at this point. Denies any fever, chills, cough, chest pain, shortness of breath at rest, headache, syncope.  He has history of hypertension and gout and he has not been taking any medication he categorically denies taking any alcohol, drugs, over the counter medications.  He mentions he is able to empty his bladder completely  Hospital Course:  1-Acute on chronic renal failure. Patient with stage 4-5 at baseline. Had pancytopenia, concern for MM (but UPEP/SPEP neg for M spike). -Renal US no evidence of obstruction.  -patient has refused HD access placement at this moment.  -Cr improving/returning to chronic baseline  -Follow up with renal as an outpatient   2-Pancytopenia; Anemia. Has received 3 units of PRBC. B 12 low at 160. No significant elevation of bilirubin. Haptoglobin normal at 195 and LDH 403  -UPEP, SPEP w/p M spike (making less likely MM).  -continue iron and B12 supplementation.  -will follow with Hem/Onc as an outpatient (Dr. Jana Hakim)  -continue coumadin; no overt bleeding appreciated and Hgb stable at discharge  3-B-12 deficiency; B-12 1000 mcg daily for 7 days; then weekly and subsequently monthly for repletion.  -to follow with Dr. Jana Hakim at cancer center at discharge for further evaluation and treatment -intrinsic factor pending   4-History of gout; bilateral ankle pain, swelling : continue with Colchicine, dose adjusted due to decrease renal GFR.  5-Constipation; patient now moving his bowels -will discharge on [place]   6-Right Medial malleolar fracture; is age indeterminate; ortho consulted. Ortho think patient has right ankle with gouty arthropathy with possible old trauma (nondisplaced fracture) of the medial malleolus. Plan: ASO to the right ankle. PT for ambulation WBAT Bilateral LEs. ROM,stretching and  strengthening to the ankles bilateral.  -will be discharged home with home health services  -continue colchicine   7-Bilateral LE DVT; patient with renal failure, thrombocytopenia.  -continue coumadin and lovenox bridging  -patient will follow at coumadin clinic at [facility] retention and BPH  -continue flomax and discharge with foley in place -urology will follow at the office for further voiding trials and urodynamics   Procedures:  US renal: no osbtruction; changes suggesting chronic disease.  Consultations:  Renal service  VVS  Orthopedic service  Hem/Onc  Urology (curb side by phone for acute urinary retention)  Discharge Exam: Filed Vitals:   05/21/13 0900  BP: 118/52  Pulse: 71  Temp:   Resp:    General: No distress.  Cardiovascular: S 1, S 2 RRR  Respiratory: no wheezing, no crackles  Abdomen: BS positive, no tenderness  Musculoskeletal: no edema; no cyanosis.   Discharge Instructions  Discharge Orders   Future Appointments Provider Department Dept Phone   05/26/2013 10:00 AM [facility] [phone]   05/26/2013 10:15 AM [facility] [phone]   Future Orders Complete By Expires   Diet - low sodium heart healthy  As directed    Discharge instructions  As directed    Comments:     Take medications as prescribed Maintain yourself hydrated Follow with Dr. Jana Hakim (Hematology/Oncology), coumadin clinic at [facility] center and [facility] as instructed Follow a low sodium diet       Medication List         colchicine 0.6 MG tablet  Take 0.5 tablets (0.3 mg total) by mouth daily.     cyanocobalamin 1000 MCG/ML injection  Commonly known as:  (VITAMIN B-12)  Inject 1 mL (1,000 mcg total) into the muscle every 30 (thirty) days. Next to be given by Dr. Jana Hakim during his follow up visit with him     enoxaparin 80  MG/0.8ML injection  Commonly known as:  LOVENOX  Inject 0.7 mLs (70 mg total) into the skin daily.     feeding supplement (ENSURE COMPLETE) Liqd  Take 237 mLs by mouth 2 (two) times daily between meals.     feeding supplement (ENSURE) Pudg  Take 1 Container by mouth daily.     ferrous sulfate 325 (65 FE) MG tablet  Take 1 tablet (325 mg total) by mouth 3 (three) times daily with meals.     multivitamin with minerals Tabs tablet  Take 1 tablet by mouth daily.     pantoprazole 40 MG tablet  Commonly known as:  PROTONIX  Take 1 tablet (40 mg total) by mouth daily at 12 noon.     senna-docusate 8.6-50 MG per tablet  Commonly known as:  Senokot-S  Take 2 tablets by mouth 2 (two) times daily.     sodium bicarbonate 650 MG tablet  Take 1 tablet (650 mg total) by mouth 2 (two) times daily.     tamsulosin 0.4 MG Caps capsule  Commonly known as:  FLOMAX  Take 1 capsule (0.4 mg total) by mouth daily after supper.     warfarin 2.5  MG tablet  Commonly known as:  COUMADIN  Take 1 tablet (2.5 mg total) by mouth one time only at 6 PM.       No Known Allergies     Follow-up Information   Follow up with [facility]. (Registered Nurse and Physical therapy to start within 24-48 hours of discharge.)    Contact information:   [address] [place] [address] [phone]       Follow up with [facility]. (Coumadin Clinic labs Wednesday 05/26/2013 10:00  )       Please follow up. (pending office call back to schedule appt - needs appt 05/26/2013 following Coumadin Clinic labs)    Contact information:   Virgie [address]. [place], [place] at [facility]. [place], [place] [address]: [phone]       Follow up with [facility]. (office will call you with appointment details in 1 week or so.)    Contact information:   [address] [place] [address] [phone]        The results of significant diagnostics from this hospitalization (including imaging, microbiology, ancillary and laboratory) are listed below for reference.    Significant Diagnostic Studies: US Renal  05/17/2013   CLINICAL DATA:  Left flank pain.  Renal failure.  EXAM: RENAL/URINARY TRACT ULTRASOUND COMPLETE  COMPARISON:  Ultrasound dated 05/18/2009  FINDINGS: Right Kidney:  Length: 9.3 cm. Diffuse increased echogenicity with several small calcifications in the upper pole. No hydronephrosis.  Left Kidney:  Length: 9.8 cm. Diffused increased echogenicity. No hydronephrosis.  Bladder:  The bladder is empty with a Foley catheter in place.  IMPRESSION: Echogenic renal parenchyma bilaterally consistent with renal medical disease, essentially unchanged since 05/18/2009. No obstruction.   Electronically Signed   By: Rozetta Nunnery M.D.   On: 05/17/2013 22:48   Dg Ankle Left Port  05/17/2013   CLINICAL DATA:  History of gout.  Swelling and pain.  EXAM: PORTABLE LEFT ANKLE - 2 VIEW  COMPARISON:  None.  FINDINGS: Diffuse soft tissue swelling. There is a tiny erosion along the inferior aspect of the medial malleolus. No evidence of an acute fracture. Calcaneal spur.  IMPRESSION: Tiny osseous erosion along the medial malleolus is in keeping with the given history of gout. Diffuse soft tissue swelling.   Electronically Signed   By: Lorin Picket M.D.   On: 05/17/2013 08:13   Dg Ankle Right Port  05/17/2013   CLINICAL DATA:  Swelling and pain.  History of gout.  EXAM: PORTABLE RIGHT ANKLE - 2 VIEW  COMPARISON:  None.  FINDINGS: There is a minimally displaced and transversely oriented fracture of the medial malleolus, indeterminate in age. Diffuse soft tissue swelling. Talar dome appears grossly intact. Degenerative changes are seen in the midfoot. Calcaneal spurs.  IMPRESSION: Medial malleolar fracture is age indeterminate. Please correlate clinically. Diffuse soft tissue swelling.   Electronically Signed   By:  Lorin Picket M.D.   On: 05/17/2013 08:11   Dg Abd Acute W/chest  05/16/2013   CLINICAL DATA:  Intermittent umbilical pain.  Occasional emesis.  EXAM: ACUTE ABDOMEN SERIES (ABDOMEN 2 VIEW & CHEST 1 VIEW)  COMPARISON:  None.  FINDINGS: Accounting for the low lung volumes and AP technique, there is borderline cardiomegaly. No edema.  No free intraperitoneal gas noted on the left-side-down lateral decubitus view. Several air-fluid levels are present in nondilated loops of small bowel. Gas  and formed stool noted in the colon.  The L5 vertebra is partially sacralized.  IMPRESSION: 1. No dilated bowel. There is gas scattered in the small bowel with some air-fluid levels in small bowel, but not a necessarily abnormal number. Formed stool noted in the colon. Overall no specific bowel gas pattern abnormality is observed. 2. Borderline cardiomegaly.   Electronically Signed   By: Sherryl Barters M.D.   On: 05/16/2013 18:33   Labs: Basic Metabolic Panel:  Recent Labs Lab 05/17/13 1030 05/18/13 0440 05/19/13 0800 05/20/13 0610 05/21/13 0520  NA 139 138 136* 137 137  K 4.8 4.8 3.9 4.0 3.9  CL 103 105 101 102 101  CO2 18* 18* 19 21 22   GLUCOSE 121* 174* 117* 100* 119*  BUN 89* 85* 79* 70* 59*  CREATININE 4.34* 4.09* 3.49* 3.35* 2.95*  CALCIUM 9.2 8.8 8.9 8.7 8.6  PHOS  --  4.5 3.3 3.4 2.8   Liver Function Tests:  Recent Labs Lab 05/16/13 1700 05/17/13 1030 05/18/13 0440 05/19/13 0800 05/20/13 0610 05/21/13 0520  AST 33 25  --   --   --   --   ALT 26 21  --   --   --   --   ALKPHOS 60 54  --   --   --   --   BILITOT 1.0 1.3*  --   --   --   --   PROT 7.6 6.6  --   --   --   --   ALBUMIN 3.2* 2.6* 2.4* 2.3* 2.4* 2.3*   CBC:  Recent Labs Lab 05/16/13 1700 05/17/13 1030  05/17/13 1937 05/18/13 0440 05/19/13 0800 05/20/13 0610 05/21/13 0520  WBC 3.4* 3.7*  < > 3.5* 3.2* 4.9 4.1 4.5  NEUTROABS 2.1 2.7  --   --   --   --  1.9 2.2  HGB 5.6* 7.3*  < > 8.6* 8.3* 8.4* 8.7* 8.6*  HCT  15.5* 19.8*  < > 23.4* 23.4* 23.0* 23.8* 24.2*  MCV 110.7* 98.0  < > 93.2 93.6 94.7 96.4 96.0  PLT 101* 75*  < > 77* 66* 75* 91* 100*  < > = values in this interval not displayed. Cardiac Enzymes:  Recent Labs Lab 05/16/13 1700  TROPONINI <0.30   BNP: BNP (last 3 results)  Recent Labs  05/16/13 1700  PROBNP 1290.0*    Signed:  Zerita Boers  [facility] 05/21/2013, 1:15 PM

## 2013-05-21 NOTE — Plan of Care (Signed)
Problem: Phase III Progression Outcomes Goal: Foley discontinued Outcome: Not Met (add Reason) D/C home with foley catheter

## 2013-05-21 NOTE — Progress Notes (Signed)
Pharmacy Note-Anticoagulation  Pharmacy Consult :  80 y.o. male is currently on Coumadin with Lovenox bridging for DVT.   Latest Labs : Hematology :  Recent Labs  05/18/13 2223 05/19/13 0800 05/20/13 0610 05/21/13 0520  HGB  --  8.4* 8.7* 8.6*  HCT  --  23.0* 23.8* 24.2*  PLT  --  75* 91* 100*  LABPROT  --   --   --  14.4  INR  --   --   --  1.14  HEPARINUNFRC 0.23* 0.61  --   --   CREATININE  --  3.49* 3.35* 2.95*    Lab Results  Component Value Date   INR 1.14 05/21/2013   INR 1.31 05/16/2013        HEPARINUNFRC 0.61 05/19/2013   HEPARINUNFRC 0.23* 05/18/2013        HGB 8.6* 05/21/2013   HGB 8.7* 05/20/2013   HGB 8.4* 05/19/2013    Current Medication[s] Include: Medication PTA: Scheduled:  . colchicine  0.3 mg Oral Daily  . cyanocobalamin  1,000 mcg Intramuscular Daily  . darbepoetin (ARANESP) injection - NON-DIALYSIS  100 mcg Subcutaneous Q Wed-1800  . enoxaparin  70 mg Subcutaneous Q24H  . feeding supplement (ENSURE COMPLETE)  237 mL Oral BID BM  . feeding supplement (ENSURE)  1 Container Oral Q24H  . feeding supplement (RESOURCE BREEZE)  1 Container Oral BID WC  . ferrous sulfate  325 mg Oral TID WC  . influenza vac split quadrivalent PF  0.5 mL Intramuscular Tomorrow-1000  . multivitamin with minerals  1 tablet Oral Daily  . pantoprazole  40 mg Oral Q1200  . senna-docusate  2 tablet Oral BID  . sodium bicarbonate  650 mg Oral BID  . sodium chloride  3 mL Intravenous Q12H  . sorbitol  30 mL Oral Daily  . tamsulosin  0.4 mg Oral QPC supper  . Warfarin - Pharmacist Dosing Inpatient   Does not apply q1800   Scheduled:  Scheduled:  . colchicine  0.3 mg Oral Daily  . cyanocobalamin  1,000 mcg Intramuscular Daily  . darbepoetin (ARANESP) injection - NON-DIALYSIS  100 mcg Subcutaneous Q Wed-1800  . enoxaparin  70 mg Subcutaneous Q24H  . feeding supplement (ENSURE COMPLETE)  237 mL Oral BID BM  . feeding supplement (ENSURE)  1 Container Oral Q24H  . feeding  supplement (RESOURCE BREEZE)  1 Container Oral BID WC  . ferrous sulfate  325 mg Oral TID WC  . influenza vac split quadrivalent PF  0.5 mL Intramuscular Tomorrow-1000  . multivitamin with minerals  1 tablet Oral Daily  . pantoprazole  40 mg Oral Q1200  . senna-docusate  2 tablet Oral BID  . sodium bicarbonate  650 mg Oral BID  . sodium chloride  3 mL Intravenous Q12H  . sorbitol  30 mL Oral Daily  . tamsulosin  0.4 mg Oral QPC supper  . Warfarin - Pharmacist Dosing Inpatient   Does not apply q1800   Assessment :  Today's INR is 1.14 following first dose of Coumadin.   INR is Subtherapeutic.    Lovenox bridging to continue overlap until INR therapeutic  No bleeding complications observed.  Goal :  INR goal is 2-3    Lovenox Anti-Xa level 0.6-1 units/ml 4hrs after LMWH dose given  Plan : 1. Continue Lovenox 70 mg sq q 24 hours. 2. Will repeat Coumadin 2.5 mg again. 3. Daily INR's, CBC. Monitor for bleeding complications  Estelle June, Pharm.D. 05/21/2013  8:48 AM

## 2013-05-21 NOTE — Telephone Encounter (Signed)
HOSP F/U SCHEDULED FOR 04/10 @ 11 W/DR. MAGRINAT.

## 2013-05-21 NOTE — Progress Notes (Signed)
Pt and family given discharge instructions, all questions answered. Pt discharged by NT by wheelchair.

## 2013-05-22 NOTE — Progress Notes (Signed)
CARE MANAGEMENT NOTE 05/22/2013  Patient:  Caleb Hayes, Caleb Hayes   Account Number:  [id number]  Date Initiated:  05/17/2013  Documentation initiated by:  Hayes,Caleb  Subjective/Objective Assessment:   Admitted with HBGB 5.6/anemia     Action/Plan:   CM to follow for disposition needs   Anticipated DC Date:  05/20/2013   Anticipated DC Plan:  [place]  CM consult      [facility] Choice  HOME HEALTH   Choice offered to / List presented to:  C-4 Adult Children   DME arranged  [facility]      DME agency  [place] arranged  [place]      [place].   Status of service:  Completed, signed off Medicare Important Message given?   (If response is "NO", the following Medicare IM given date fields will be blank) Date Medicare IM given:   Date Additional Medicare IM given:    Discharge Disposition:  [place]  Per UR Regulation:  Reviewed for med. necessity/level of care/duration of stay  If discussed at [place] of Stay Meetings, dates discussed:    Comments:  05/22/13 18:40 CM received call from daughter of Caleb Hayes stating she did not receive pt's needed colchicine prescription at discharge yesterday.  CM called MD who took pharmacy number and states he will call ccolchicine med into pharmacy.  CM called daughter back and gave her my number in case there was a problem with the pharmacy.  No other Cm needs were communicated.  Caleb Hayes, BSN, [place] [phone].  05/21/2013 Disposition Plan:  Home with HHS:  RN, PT Special RN Instructions for INR on Monday 05/24/13 D/c home on Lovenox injections. [place]  [phone] Coumadin Clinic labs Wednesday 05/26/2013 10:00 Caleb Hayes, Caleb Hayes at [facility]. [place], [place] Main: [phone] Appt:  06/18/13  11:00 (Anemia MGMT)  Lovenox/Generic per rep at [employer]: lovenox name brand is NOT COVERED Generic: tier 2, if patient uses [facility], [place], [facility], [facility], or some of the mom and pop pharmacies, they will have a $6 co-pay all other pharmacies their co-pay will be [address] * [facility] [address], [place], [place] [address] [phone] (CM called for availability - NONE)  [facility] [place], [place],  [phone] (CM called for availabilty - yes.  CM instructed and provided PCG with updated information and printed pharmacy contact infromation.  CM instructed to take Jal card to pharmacy at time of pick up d/t none on file.  DTR states this is because patient has never been sick or required any meds until now. Caleb Hutchinson RN, BSN, MSHL, CCM 05/21/2013   ---05/21/2013 [phone] by Caleb Hayes--- Benefits check Please check co-pay and auth and deductibles for Lovenox 70mg  injections qd x 5 days d/c scheduled for today. Thanks, [employer] [phone] RN, BSN, MSHL, CCM 05/21/2013  05/20/2013 Adm with anemia; admitted with HBg 5.6. 3 units PRBC given. hx/o refusing HD. Nephrology consult completed and signed off Anemic current HCT 23.8  following 3 Units; Urinary Retention - [place] Social:  From home with family; DTRs requesting no visitors. Home DME:  Caleb Hayes to right leg (refused Hospital Bed) Elects HHS Provider:  [facility] Dispostion Plan: HHS:  PT (Caleb Hayes / Caleb Hayes  notified) DME:  [facility] (Caleb Hayes notified) ADD: +1 Caleb Hutchinson, RN, BSN, MSHL, CCM 05/20/2013   05/17/2013 Hx/o anemia; admitted with HBg 5.6. 2 units PRBC given. Nephrology consult pending Social:  From home with family; DTRs requesting no visitors. Disposition Plan pending Caleb Laster, RN, BSN, [place], CCM 05/17/2013

## 2013-05-24 ENCOUNTER — Telehealth: Payer: Self-pay | Admitting: *Deleted

## 2013-05-24 NOTE — Telephone Encounter (Signed)
Call from daughter, Apolonio Schneiders : Asking why her father is seeing Dr. Jana Hakim? After confirmation that she is a emergency contact made her aware he is seeing Dr. Jana Hakim as hematolgist for his clotting problems and anemia.

## 2013-05-26 ENCOUNTER — Other Ambulatory Visit: Payer: Medicare Other

## 2013-05-26 ENCOUNTER — Telehealth: Payer: Self-pay | Admitting: Pharmacist

## 2013-05-26 ENCOUNTER — Ambulatory Visit: Payer: Medicare Other

## 2013-05-26 NOTE — Telephone Encounter (Signed)
Patient FTKA in Coumadin Clinic today.  Called and spoke with patient and patient's sister.  Patient's primary caregiver "got mad and left" today.  His sister was unaware he had an appointment today.  She is willing to bring him to appointments.  Appointment rescheduled for 05/27/13 with lab at 2:00 and Coumadin Clinic at 2:15.

## 2013-05-27 ENCOUNTER — Ambulatory Visit (HOSPITAL_BASED_OUTPATIENT_CLINIC_OR_DEPARTMENT_OTHER): Payer: Medicare Other | Admitting: Pharmacist

## 2013-05-27 ENCOUNTER — Other Ambulatory Visit (HOSPITAL_BASED_OUTPATIENT_CLINIC_OR_DEPARTMENT_OTHER): Payer: Medicare Other

## 2013-05-27 DIAGNOSIS — I82409 Acute embolism and thrombosis of unspecified deep veins of unspecified lower extremity: Secondary | ICD-10-CM

## 2013-05-27 DIAGNOSIS — Z7901 Long term (current) use of anticoagulants: Secondary | ICD-10-CM

## 2013-05-27 LAB — PROTIME-INR
INR: 1.1 — ABNORMAL LOW (ref 2.00–3.50)
Protime: 13.2 Seconds (ref 10.6–13.4)

## 2013-05-27 LAB — POCT INR: INR: 1.1

## 2013-05-27 NOTE — Progress Notes (Signed)
INR = 1.1  Goal 2-3 Patient diagnosed with RLE DVT on 05/17/13.   Baseline INR 1.31 On heparin at [facility] 3/10-3/11 when IV access lost. Started Lovenox at 70 mg per day dosed at 1 mg/kg due to renal insufficiency, still continues. Coumadin 2.5 mg daily started 05/20/13. Patient is seen in the Coumadin clinic for the first time today. He is accompanied by his sister, Mariann Laster, with whom he lives and his daughter, Doroteo Bradford. Mariann Laster is on chronic Coumadin and is well informed on the importance of diet while on Coumadin. Patient education completed. He has had no complications of anticoagulation. INR is lower today after 1 week of Coumadin than it was at baseline, patient was on heparin at that time. Patient is seen by [place] weekly. They are aware we may be requesting INR draws. I have called [facility] and requested the RN assigned to Mr. [place] to call. Patient has been given samples:  Lovenox 70 mg #10   Lot:  [id number]  Exp:  08/2015 Increase Coumadin to 2.5 mg daily, except 3.75 mg (1 1/2 tablets) daily on Tuesdays, Thursdays, and Fridays.  Continue Lovenox shots. INR will be checked Monday 3/23.  We will check with [facility] to see if INR can be drawn at home, if not we will call you to schedule an appointment. He is currently living with his sister, Mariann Laster.  Her telephone number is [phone].  Theone Murdoch, PharmD

## 2013-05-28 ENCOUNTER — Encounter: Payer: Self-pay | Admitting: Pharmacist

## 2013-05-28 NOTE — Progress Notes (Signed)
I spoke with Anderson Malta, RN at [facility].  She will draw PT/INR on Monday 05/31/13 for Mr Hoke and call [place] with results.

## 2013-05-31 ENCOUNTER — Ambulatory Visit: Payer: Medicare Other | Admitting: Pharmacist

## 2013-05-31 DIAGNOSIS — I82409 Acute embolism and thrombosis of unspecified deep veins of unspecified lower extremity: Secondary | ICD-10-CM

## 2013-05-31 LAB — POCT INR: INR: 1.1

## 2013-05-31 NOTE — Patient Instructions (Signed)
INR remains below goal Take Coumadin 2.5 mg daily, except 3.75 mg (1 1/2 tablets) daily on Mondays, Wednesdays and Fridays.   Continue Lovenox shots.  INR will be checked Friday 3/27.  Will be checked by [facility] nurse Remember it is very important to take the coumadin and lovenox everyday as instructed.

## 2013-05-31 NOTE — Progress Notes (Signed)
*  No Charge - Telephone encounter* INR continues to be below goal Patient continues on lovenox Spoke with [facility] nurse, Juliann Pulse on the phone Pt is doing well with no complaints No unusual bleeding or bruising Pt did refuse 1-2 days of coumadin over the weekend and has only been taking 1 tablet daily instead of as instructed at last visit on 05/27/13.  This is why his INR continues to be below goal I reinforced the importance of compliance  and taking his coumadin and lovenox everyday as instructed. He voiced understanding.  Plan: Increase Coumadin to 2.5 mg daily, except 3.75 mg (1 1/2 tablets) daily on Mondays, Wednesdays and Fridays.  Continue Lovenox shots. INR will be checked Friday 3/27.  Will be checked by [facility] nurse

## 2013-06-04 ENCOUNTER — Ambulatory Visit (INDEPENDENT_AMBULATORY_CARE_PROVIDER_SITE_OTHER): Payer: Self-pay | Admitting: Pharmacist

## 2013-06-04 DIAGNOSIS — I82409 Acute embolism and thrombosis of unspecified deep veins of unspecified lower extremity: Secondary | ICD-10-CM

## 2013-06-04 LAB — POCT INR: INR: 1.2

## 2013-06-04 NOTE — Progress Notes (Signed)
Spoke with patients sister, Mariann Laster, after INR reported from [facility] nurse, Miranda. INR=1.2 on 2.5mg  daily and 3.75 on MWF and lovenox shots Mariann Laster states he has been taking coumadin and has not refused any doses.  Lovenox shots as well. Confirmed stomach bruises with [facility] nurse. Plan to increase dose to 2.5 mg daily and 5 mg on MWF, continue lovenox Mariann Laster confirmed they have 5 shots remaining.   Informed Miranda to plan on next INR, Tue, Mar 31

## 2013-06-04 NOTE — Patient Instructions (Signed)
Take Coumadin 2.5 mg daily, except 5 mg (2 tablets) daily on Mondays, Wednesdays and Fridays.  Continue Lovenox shots. INR will be checked Tuesday 3/27.  Will be checked by [facility] nurse

## 2013-06-08 ENCOUNTER — Ambulatory Visit: Payer: Medicare Other | Admitting: Pharmacist

## 2013-06-08 DIAGNOSIS — I82409 Acute embolism and thrombosis of unspecified deep veins of unspecified lower extremity: Secondary | ICD-10-CM

## 2013-06-08 LAB — POCT INR: INR: 1.5

## 2013-06-08 NOTE — Progress Notes (Signed)
**  telephone encounter-No Charge##  INR = 1.5.  Checked by [facility] nurse.  Per discussion with [place], Mr Caleb Hayes has not missed any coumadin doses and continues Lovenox injections.  Will increase coumadin to 5mg  today, then continue 5mg  MWF and 2.5mg  other days.  I spoke to [place] on telephone.  She stated that Mr Caleb Hayes has a dose of Lovenox for tomorrow.  I will f/u with Dr. Jana Caleb Hayes if he would like to continue Lovenox injections.

## 2013-06-09 ENCOUNTER — Encounter: Payer: Self-pay | Admitting: Pharmacist

## 2013-06-09 NOTE — Progress Notes (Signed)
I spoke to Dr. Jana Hakim about Mr. Todt anticoagulation plan.  Will continue Lovenox 1mg /kg (dose reduced for renal impairment) x 3 more doses.  I left VM with Mariann Laster, Mr Enochs sister, to continue Lovenox for 3 more days and take Coumadin 5mg  daily.  Mr Bralley has Lovenox dose for today.  Mariann Laster to call back to let us know where to call Lovenox prescription or if she wants to pick up samples from [facility].  I have left a VM at [facility] with Anderson Malta, RN to check PT/INR on Friday 06/11/13.

## 2013-06-10 ENCOUNTER — Other Ambulatory Visit: Payer: Self-pay | Admitting: Pharmacist

## 2013-06-10 DIAGNOSIS — I82409 Acute embolism and thrombosis of unspecified deep veins of unspecified lower extremity: Secondary | ICD-10-CM

## 2013-06-10 MED ORDER — ENOXAPARIN SODIUM 80 MG/0.8ML ~~LOC~~ SOLN: 70.0000 mg | SUBCUTANEOUS | Status: DC

## 2013-06-10 NOTE — Telephone Encounter (Signed)
Lovenox refill called into [place] per information from [place].

## 2013-06-11 ENCOUNTER — Ambulatory Visit: Payer: Medicare Other | Admitting: Pharmacist

## 2013-06-11 DIAGNOSIS — I82409 Acute embolism and thrombosis of unspecified deep veins of unspecified lower extremity: Secondary | ICD-10-CM

## 2013-06-11 LAB — POCT INR: INR: 2

## 2013-06-11 NOTE — Progress Notes (Signed)
**  telephone encounter-No Charge** INR = 2.0. Checked by [facility] nurse. Per discussion with [facility] nurse, Mr Piechowski has not missed any coumadin doses and has been taking 5 mg (2 tablets) daily and continues Lovenox injections. Reports no bleeding or bruising No medication or diet changes Mr. Baldez can stop lovenox shots  Will keep coumadin at 5mg  daily [facility] will check INR on 06/15/13 After this [facility] will likely be discharging Mr. Hersi and he will need to come in to have his INR checked after next week.

## 2013-06-11 NOTE — Patient Instructions (Signed)
INR at goal Stop lovenox shots Take coumadin 5 mg daily (2 tablets daily) Recheck via home health nurse on Tuesday 4/7

## 2013-06-15 ENCOUNTER — Ambulatory Visit: Payer: Medicare Other | Admitting: Pharmacist

## 2013-06-15 DIAGNOSIS — I82409 Acute embolism and thrombosis of unspecified deep veins of unspecified lower extremity: Secondary | ICD-10-CM

## 2013-06-15 LAB — POCT INR: INR: 2.2

## 2013-06-15 NOTE — Patient Instructions (Signed)
INR at goal No changes Continue 5 mg  Daily.  Return to coumadin clinic in 2 weeks on 07/02/13 at 9:30am for lab and 9:45am for coumadin clinic Refill for new strength of 5 mg tablets called into [place] will only need to take 1 tablet a day now

## 2013-06-15 NOTE — Progress Notes (Signed)
*  Telephone Encounter- No Charge* INR at goal Spoke to nurse with [facility] as well as sister Mariann Laster. Pt is doing well with no complaints He is currently using pill box and his sister his helping manage his coumadin No unusual bleeding or bruising No missed or extra doses No diet or medication changes Pt is running low on current 2.5 mg Rx Refill for new strength of 5 mg tablets called into [facility] is discharging patient and he will now be coming [facility] cou[facility]in clinic for future INR checks/visits. Plans: No changes Continue 5 mg daily (1 tablet).  Return to coumadin clinic in 2 weeks on 07/02/13 at 9:30am for lab and 9:45am for coumadin clinic

## 2013-06-18 ENCOUNTER — Telehealth: Payer: Self-pay | Admitting: Oncology

## 2013-06-18 ENCOUNTER — Encounter: Payer: Self-pay | Admitting: Oncology

## 2013-06-18 ENCOUNTER — Other Ambulatory Visit: Payer: Self-pay | Admitting: *Deleted

## 2013-06-18 ENCOUNTER — Ambulatory Visit: Payer: Medicare Other

## 2013-06-18 ENCOUNTER — Ambulatory Visit (HOSPITAL_BASED_OUTPATIENT_CLINIC_OR_DEPARTMENT_OTHER): Payer: Medicare Other | Admitting: Oncology

## 2013-06-18 ENCOUNTER — Other Ambulatory Visit (HOSPITAL_BASED_OUTPATIENT_CLINIC_OR_DEPARTMENT_OTHER): Payer: Medicare Other

## 2013-06-18 VITALS — BP 201/78 | HR 68 | Temp 97.9°F | Resp 20 | Ht 69.0 in | Wt 151.9 lb

## 2013-06-18 DIAGNOSIS — I82409 Acute embolism and thrombosis of unspecified deep veins of unspecified lower extremity: Secondary | ICD-10-CM

## 2013-06-18 DIAGNOSIS — D649 Anemia, unspecified: Secondary | ICD-10-CM

## 2013-06-18 DIAGNOSIS — D510 Vitamin B12 deficiency anemia due to intrinsic factor deficiency: Secondary | ICD-10-CM

## 2013-06-18 DIAGNOSIS — N289 Disorder of kidney and ureter, unspecified: Secondary | ICD-10-CM

## 2013-06-18 LAB — COMPREHENSIVE METABOLIC PANEL (CC13)
ALBUMIN: 3.2 g/dL — AB (ref 3.5–5.0)
ALT: 11 U/L (ref 0–55)
ANION GAP: 11 meq/L (ref 3–11)
AST: 15 U/L (ref 5–34)
Alkaline Phosphatase: 75 U/L (ref 40–150)
BUN: 26.8 mg/dL — ABNORMAL HIGH (ref 7.0–26.0)
CALCIUM: 9.5 mg/dL (ref 8.4–10.4)
CO2: 22 meq/L (ref 22–29)
CREATININE: 2.6 mg/dL — AB (ref 0.7–1.3)
Chloride: 110 mEq/L — ABNORMAL HIGH (ref 98–109)
Glucose: 101 mg/dl (ref 70–140)
POTASSIUM: 4.5 meq/L (ref 3.5–5.1)
Sodium: 144 mEq/L (ref 136–145)
Total Bilirubin: 0.39 mg/dL (ref 0.20–1.20)
Total Protein: 7.6 g/dL (ref 6.4–8.3)

## 2013-06-18 LAB — PROTIME-INR
INR: 2.3 (ref 2.00–3.50)
Protime: 27.6 Seconds — ABNORMAL HIGH (ref 10.6–13.4)

## 2013-06-18 MED ORDER — CYANOCOBALAMIN 1000 MCG/ML IJ SOLN
INTRAMUSCULAR | Status: AC
  Filled 2013-06-18: qty 1

## 2013-06-18 NOTE — Progress Notes (Signed)
Checked in new pt with no financial concerns. °

## 2013-06-18 NOTE — Progress Notes (Signed)
[facility]  [phone] [phone]) [phone]     ID: Randye Lobo Lang OB: 11-16-1929  MR#: [id number]  YKZ#:[id number]  PCP: Pcp Not In System GYN:   SU:  OTHER MD:  CHIEF COMPLAINT: I feel weak"  HX PRESENT ILLNESS: Mr. No presented to the emergency room on 05/16/2013 with complaints of weakness, fatigue, and lower extremity pain. Exam was unrevealing but lab work showed a creatinine of 4.34 with BUN 89. Potassium, sodium, calcium, and total protein were normal. The white cell count was 3.7, platelet count 75,000 and hemoglobin 7.3 with an MCV of 98. Ferritin was 3619, folate 14.4, and B12 160. Bilateral venous Dopplers were consistent with acute deep venous thrombosis involving the right common femoral vein, right popliteal vein and the) Caleb Hayes. On the left side there was a posterior tibial vein thrombus of indeterminate age.   We were consulted to further evaluate the cytopenias and rule out multiple myeloma. In addition there was a question of anticoagulation in this patient with thrombocytopenia. The patient's subsequet history is as detailed below  INTERVAL HISTORY: Mr. Edmister returns today for followup of his pernicious anemia, renal insufficiency, pancytopenia and history of deep vein thrombosis, accompanied by his sister. Since his last visit here we have the final results of his workup for myeloma, which showed no M. spike and no monoclonality in his Or lambda light chains. In addition, we obtained intrinsic factor antibodies, which confirmed the diagnosis of pernicious anemia. The patient was started on B12 supplementation in the hospital, with significant improvement in his platelet count. However he has not had further B12 doses since discharge. He is being followed through our Coumadin clinic for INR monitoring  REVIEW OF SYSTEMS: Mr Vivian is feeling much better overall, and has been able to go fishing on several occasions. He does have some  left sciatica symptoms and some lower back pain. He denies unusual headaches cough, phlegm production, or pleurisy. There has been no change in bowel or bladder habits. A detailed review of systems was otherwise noncontributory  PAST MEDICAL HISTORY: Past Medical History  Diagnosis Date  . Hypertension   . Gout     PAST SURGICAL HISTORY: No past surgical history on file.  FAMILY HISTORY No family history on file.  SOCIAL HISTORY:  The patient's daughter Apolonio Schneiders lives with him. She has a 3d shift job. The patient's sister lives nearby and "can come in" as needed.    ADVANCED DIRECTIVES: not in place   HEALTH MAINTENANCE: History  Substance Use Topics  . Smoking status: Former Research scientist (life sciences)  . Smokeless tobacco: Not on file  . Alcohol Use: Yes     Comment: "every once in a while, not too regular"    No Known Allergies  Current Outpatient Prescriptions  Medication Sig Dispense Refill  . colchicine 0.6 MG tablet Take 0.5 tablets (0.3 mg total) by mouth daily.      . cyanocobalamin (,VITAMIN B-12,) 1000 MCG/ML injection Inject 1 mL (1,000 mcg total) into the muscle every 30 (thirty) days. Next to be given by Dr. Jana Hakim during his follow up visit with him  1 mL  0  . enoxaparin (LOVENOX) 80 MG/0.8ML injection Inject 0.7 mLs (70 mg total) into the skin daily.  10 Syringe  1  . feeding supplement, ENSURE COMPLETE, (ENSURE COMPLETE) LIQD Take 237 mLs by mouth 2 (two) times daily between meals.      . feeding supplement, ENSURE, (ENSURE) PUDG Take 1 Container by mouth daily.  0  . ferrous sulfate 325 (65 FE) MG tablet Take 1 tablet (325 mg total) by mouth 3 (three) times daily with meals.  90 tablet  3  . Multiple Vitamin (MULTIVITAMIN WITH MINERALS) TABS tablet Take 1 tablet by mouth daily.      . pantoprazole (PROTONIX) 40 MG tablet Take 1 tablet (40 mg total) by mouth daily at 12 noon.  30 tablet  1  . senna-docusate (SENOKOT-S) 8.6-50 MG per tablet Take 2 tablets by mouth 2 (two)  times daily.  30 tablet  0  . sodium bicarbonate 650 MG tablet Take 1 tablet (650 mg total) by mouth 2 (two) times daily.  60 tablet  1  . tamsulosin (FLOMAX) 0.4 MG CAPS capsule Take 1 capsule (0.4 mg total) by mouth daily after supper.  30 capsule  1  . warfarin (COUMADIN) 2.5 MG tablet Take 1 tablet (2.5 mg total) by mouth one time only at 6 PM.  30 tablet  0   No current facility-administered medications for this visit.    OBJECTIVE: elderly African American man in no acute distress  Filed Vitals:   06/18/13 1115  BP: 201/78  Pulse: 68  Temp: 97.9 F (36.6 C)  Resp: 20     Body mass index is 22.42 kg/(m^2).    ECOG FS:1 - Symptomatic but completely ambulatory  Sclerae unicteric, EOMs intact Oropharynx clear and moist-- poor dentition No cervical or supraclavicular adenopathy Lungs no rales or rhonchi Heart regular rate and rhythm Abd soft, nontender, positive bowel sounds MSK no focal spinal tenderness, no lower extremity swelling Neuro: nonfocal, well oriented, jovial affect  LAB RESULTS:  CMP     Component Value Date/Time   NA 137 05/21/2013 0520   K 3.9 05/21/2013 0520   CL 101 05/21/2013 0520   CO2 22 05/21/2013 0520   GLUCOSE 119* 05/21/2013 0520   BUN 59* 05/21/2013 0520   CREATININE 2.95* 05/21/2013 0520   CALCIUM 8.6 05/21/2013 0520   PROT 6.6 05/17/2013 1030   ALBUMIN 2.3* 05/21/2013 0520   AST 25 05/17/2013 1030   ALT 21 05/17/2013 1030   ALKPHOS 54 05/17/2013 1030   BILITOT 1.3* 05/17/2013 1030   GFRNONAA 19* 05/21/2013 0520   GFRAA 21* 05/21/2013 0520    I No results found for this basename: SPEP,  UPEP,   kappa and lambda light chains    Lab Results  Component Value Date   WBC 4.5 05/21/2013   NEUTROABS 2.2 05/21/2013   HGB 8.6* 05/21/2013   HCT 24.2* 05/21/2013   MCV 96.0 05/21/2013   PLT 100* 05/21/2013    _0 @  No results found for this basename: LABCA2    No components found with this basename: LABCA125     Recent Labs Lab 06/18/13 1108   INR 2.30    Urinalysis    Component Value Date/Time   COLORURINE YELLOW 05/16/2013 2040   APPEARANCEUR CLOUDY* 05/16/2013 2040   LABSPEC 1.016 05/16/2013 2040   PHURINE 5.0 05/16/2013 2040   GLUCOSEU NEGATIVE 05/16/2013 2040   HGBUR NEGATIVE 05/16/2013 2040   BILIRUBINUR NEGATIVE 05/16/2013 2040   KETONESUR NEGATIVE 05/16/2013 2040   PROTEINUR 30* 05/16/2013 2040   UROBILINOGEN 0.2 05/16/2013 2040   NITRITE NEGATIVE 05/16/2013 2040   LEUKOCYTESUR NEGATIVE 05/16/2013 2040    STUDIES: No results found.  ASSESSMENT: 80 y.o. year old [place] man   (1) pernicious anemia with intrinsic factor antibodies documented March 2015  (2) renal insufficiency with negative myeloma workup  (3)  Right lower extremity DVT March 2015, on coumadin  PLAN: We are setting up Mr Mottern for monthly B-12  shots as well as monitoring  his INR through the coumadin clinic Here. If his hemoglobin does not respond briskly to B-12 supplementation he may need aranesp monthly. We will see him on a once a year basis. I have encouraged him to establish himself with a primary care MD.   Chauncey Cruel, MD   06/18/2013 11:27 AM

## 2013-06-18 NOTE — Telephone Encounter (Signed)
, °

## 2013-06-23 ENCOUNTER — Other Ambulatory Visit (HOSPITAL_COMMUNITY): Payer: Self-pay | Admitting: Urology

## 2013-06-23 DIAGNOSIS — N281 Cyst of kidney, acquired: Secondary | ICD-10-CM

## 2013-07-02 ENCOUNTER — Other Ambulatory Visit (HOSPITAL_COMMUNITY): Payer: Medicare Other

## 2013-07-02 ENCOUNTER — Ambulatory Visit (HOSPITAL_BASED_OUTPATIENT_CLINIC_OR_DEPARTMENT_OTHER): Payer: Medicare Other

## 2013-07-02 ENCOUNTER — Ambulatory Visit (HOSPITAL_COMMUNITY)
Admission: RE | Admit: 2013-07-02 | Discharge: 2013-07-02 | Disposition: A | Discharge status: Home / Self Care | Payer: Medicare Other | Source: Ambulatory Visit | Attending: Urology | Admitting: Urology

## 2013-07-02 ENCOUNTER — Ambulatory Visit (HOSPITAL_BASED_OUTPATIENT_CLINIC_OR_DEPARTMENT_OTHER): Payer: Self-pay | Admitting: Pharmacist

## 2013-07-02 ENCOUNTER — Other Ambulatory Visit (HOSPITAL_BASED_OUTPATIENT_CLINIC_OR_DEPARTMENT_OTHER): Payer: Medicare Other

## 2013-07-02 ENCOUNTER — Other Ambulatory Visit: Payer: Self-pay | Admitting: Oncology

## 2013-07-02 VITALS — BP 153/74 | HR 67 | Temp 97.3°F

## 2013-07-02 DIAGNOSIS — D649 Anemia, unspecified: Secondary | ICD-10-CM

## 2013-07-02 DIAGNOSIS — I82409 Acute embolism and thrombosis of unspecified deep veins of unspecified lower extremity: Secondary | ICD-10-CM

## 2013-07-02 DIAGNOSIS — N289 Disorder of kidney and ureter, unspecified: Secondary | ICD-10-CM

## 2013-07-02 DIAGNOSIS — D510 Vitamin B12 deficiency anemia due to intrinsic factor deficiency: Secondary | ICD-10-CM

## 2013-07-02 DIAGNOSIS — N281 Cyst of kidney, acquired: Secondary | ICD-10-CM | POA: Insufficient documentation

## 2013-07-02 DIAGNOSIS — N269 Renal sclerosis, unspecified: Secondary | ICD-10-CM | POA: Insufficient documentation

## 2013-07-02 LAB — CBC WITH DIFFERENTIAL/PLATELET
BASO%: 1 % (ref 0.0–2.0)
BASOS ABS: 0.1 10*3/uL (ref 0.0–0.1)
EOS%: 6 % (ref 0.0–7.0)
Eosinophils Absolute: 0.5 10*3/uL (ref 0.0–0.5)
HCT: 33.5 % — ABNORMAL LOW (ref 38.4–49.9)
HEMOGLOBIN: 11.3 g/dL — AB (ref 13.0–17.1)
LYMPH#: 2.4 10*3/uL (ref 0.9–3.3)
LYMPH%: 27.3 % (ref 14.0–49.0)
MCH: 29.8 pg (ref 27.2–33.4)
MCHC: 33.7 g/dL (ref 32.0–36.0)
MCV: 88.4 fL (ref 79.3–98.0)
MONO#: 0.5 10*3/uL (ref 0.1–0.9)
MONO%: 5.6 % (ref 0.0–14.0)
NEUT#: 5.2 10*3/uL (ref 1.5–6.5)
NEUT%: 60.1 % (ref 39.0–75.0)
PLATELETS: 283 10*3/uL (ref 140–400)
RBC: 3.79 10*6/uL — ABNORMAL LOW (ref 4.20–5.82)
RDW: 17.8 % — ABNORMAL HIGH (ref 11.0–14.6)
WBC: 8.6 10*3/uL (ref 4.0–10.3)

## 2013-07-02 LAB — PROTIME-INR
INR: 3.5 (ref 2.00–3.50)
Protime: 42 Seconds — ABNORMAL HIGH (ref 10.6–13.4)

## 2013-07-02 LAB — POCT INR: INR: 3.5

## 2013-07-02 MED ORDER — CYANOCOBALAMIN 1000 MCG/ML IJ SOLN
1000.0000 ug | Freq: Once | INTRAMUSCULAR | Status: AC
  Administered 2013-07-02: 1000 ug via INTRAMUSCULAR

## 2013-07-02 NOTE — Progress Notes (Signed)
INR above goal today Pt seen in clinic with his sister, Mariann Laster Patient reports no complaints and is doing well No missed or extra doses No unusual bleeding or bruising His sister, Mariann Laster, sets up his medication pill box for him each day/week No diet or medication changes Plan: No coumadin tonight then Continue 5 mg daily (1 tablet).  Return to coumadin clinic in 2 weeks on 07/12/13 at 10am for lab and 10:15am for coumadin clinic

## 2013-07-02 NOTE — Patient Instructions (Signed)
INR above goal today No coumadin tonight then Continue 5 mg daily (1 tablet).  Return to coumadin clinic in 2 weeks on 07/12/13 at 10am for lab and 10:15am for coumadin clinic

## 2013-07-12 ENCOUNTER — Ambulatory Visit: Payer: Medicare Other | Admitting: Pharmacist

## 2013-07-12 ENCOUNTER — Other Ambulatory Visit (HOSPITAL_BASED_OUTPATIENT_CLINIC_OR_DEPARTMENT_OTHER): Payer: Medicare Other

## 2013-07-12 DIAGNOSIS — I82409 Acute embolism and thrombosis of unspecified deep veins of unspecified lower extremity: Secondary | ICD-10-CM

## 2013-07-12 LAB — PROTHROMBIN TIME
INR: 4.6 — ABNORMAL HIGH (ref <1.50)
Prothrombin Time: 41.7 seconds — ABNORMAL HIGH (ref 11.6–15.2)

## 2013-07-12 LAB — PROTIME-INR

## 2013-07-12 LAB — POCT INR: INR: 4.6

## 2013-07-12 NOTE — Progress Notes (Signed)
*  telephone Encounter* No Charge INR above goal goal today at 4.6 Pt is doing well with no complaints Spoke to daughter over the phone with instructions Reports no missed or extra doses No unusual bleeding or bruising No diet or medication changes Plan: No coumadin tonight or tomorrow  then decrease dose to 5 mg (1 tablet) daily except for 2.5 mg (half tablet) on Monday, Wednesday, and Friday.  Return 07/23/13 at 10:30am for lab and 10:45am for coumadin clinic

## 2013-07-12 NOTE — Patient Instructions (Addendum)
INR above goal No coumadin tonight or tomorrow  then decrease dose to 5 mg (1 tablet) daily except for 2.5 mg (half tablet) on Monday, Wednesday, and Friday.  Return 07/23/13 at 10:30am for lab and 10:45am for coumadin clinic

## 2013-07-23 ENCOUNTER — Ambulatory Visit (HOSPITAL_BASED_OUTPATIENT_CLINIC_OR_DEPARTMENT_OTHER): Payer: Self-pay | Admitting: Pharmacist

## 2013-07-23 ENCOUNTER — Other Ambulatory Visit (HOSPITAL_BASED_OUTPATIENT_CLINIC_OR_DEPARTMENT_OTHER): Payer: Medicare Other

## 2013-07-23 DIAGNOSIS — I82409 Acute embolism and thrombosis of unspecified deep veins of unspecified lower extremity: Secondary | ICD-10-CM

## 2013-07-23 DIAGNOSIS — N289 Disorder of kidney and ureter, unspecified: Secondary | ICD-10-CM

## 2013-07-23 DIAGNOSIS — D510 Vitamin B12 deficiency anemia due to intrinsic factor deficiency: Secondary | ICD-10-CM

## 2013-07-23 LAB — PROTIME-INR
INR: 3.1 (ref 2.00–3.50)
Protime: 37.2 Seconds — ABNORMAL HIGH (ref 10.6–13.4)

## 2013-07-23 LAB — CBC WITH DIFFERENTIAL/PLATELET
BASO%: 1.2 % (ref 0.0–2.0)
BASOS ABS: 0.1 10*3/uL (ref 0.0–0.1)
EOS ABS: 0.4 10*3/uL (ref 0.0–0.5)
EOS%: 5.7 % (ref 0.0–7.0)
HEMATOCRIT: 33.2 % — AB (ref 38.4–49.9)
HGB: 11.3 g/dL — ABNORMAL LOW (ref 13.0–17.1)
LYMPH#: 2.4 10*3/uL (ref 0.9–3.3)
LYMPH%: 31.7 % (ref 14.0–49.0)
MCH: 28.8 pg (ref 27.2–33.4)
MCHC: 34 g/dL (ref 32.0–36.0)
MCV: 84.5 fL (ref 79.3–98.0)
MONO#: 0.5 10*3/uL (ref 0.1–0.9)
MONO%: 6.3 % (ref 0.0–14.0)
NEUT#: 4.2 10*3/uL (ref 1.5–6.5)
NEUT%: 55.1 % (ref 39.0–75.0)
Platelets: 278 10*3/uL (ref 140–400)
RBC: 3.93 10*6/uL — ABNORMAL LOW (ref 4.20–5.82)
RDW: 16.5 % — ABNORMAL HIGH (ref 11.0–14.6)
WBC: 7.6 10*3/uL (ref 4.0–10.3)
nRBC: 0 % (ref 0–0)

## 2013-07-23 LAB — POCT INR: INR: 3.1

## 2013-07-23 NOTE — Patient Instructions (Signed)
INR at goal Continue same dose of 5 mg (1 tablet) daily except for 2.5 mg (half tablet) on Monday, Wednesday, and Friday.  Return 07/30/13 at 10:30am for lab and 10:45am for coumadin clinic and injection at 11am

## 2013-07-23 NOTE — Progress Notes (Signed)
INR right at goal Pt seen with daughter He is doing well with no complaints No unusual bleeding or bruising No missed or extra doses (pt took coumadin as instructed) No medication or diet changes New decreased dose appears to be working well. No changes  Plan:   Continue same dose of 5 mg (1 tablet) daily except for 2.5 mg (half tablet) on Monday, Wednesday, and Friday.  Return 07/30/13 at 10:30am for lab and 10:45am for coumadin clinic and injection at 11am

## 2013-07-30 ENCOUNTER — Ambulatory Visit (HOSPITAL_BASED_OUTPATIENT_CLINIC_OR_DEPARTMENT_OTHER): Payer: Self-pay | Admitting: Pharmacist

## 2013-07-30 ENCOUNTER — Other Ambulatory Visit (HOSPITAL_BASED_OUTPATIENT_CLINIC_OR_DEPARTMENT_OTHER): Payer: Medicare Other

## 2013-07-30 ENCOUNTER — Ambulatory Visit (HOSPITAL_BASED_OUTPATIENT_CLINIC_OR_DEPARTMENT_OTHER): Payer: Medicare Other

## 2013-07-30 VITALS — BP 147/72 | HR 68 | Temp 97.5°F

## 2013-07-30 DIAGNOSIS — I82409 Acute embolism and thrombosis of unspecified deep veins of unspecified lower extremity: Secondary | ICD-10-CM

## 2013-07-30 DIAGNOSIS — D649 Anemia, unspecified: Secondary | ICD-10-CM

## 2013-07-30 DIAGNOSIS — D510 Vitamin B12 deficiency anemia due to intrinsic factor deficiency: Secondary | ICD-10-CM

## 2013-07-30 LAB — CBC WITH DIFFERENTIAL/PLATELET
BASO%: 1 % (ref 0.0–2.0)
BASOS ABS: 0.1 10*3/uL (ref 0.0–0.1)
EOS ABS: 0.5 10*3/uL (ref 0.0–0.5)
EOS%: 7.1 % — ABNORMAL HIGH (ref 0.0–7.0)
HCT: 32.8 % — ABNORMAL LOW (ref 38.4–49.9)
HGB: 11.4 g/dL — ABNORMAL LOW (ref 13.0–17.1)
LYMPH#: 2.7 10*3/uL (ref 0.9–3.3)
LYMPH%: 39.2 % (ref 14.0–49.0)
MCH: 28.9 pg (ref 27.2–33.4)
MCHC: 34.8 g/dL (ref 32.0–36.0)
MCV: 83 fL (ref 79.3–98.0)
MONO#: 0.5 10*3/uL (ref 0.1–0.9)
MONO%: 7.2 % (ref 0.0–14.0)
NEUT%: 45.5 % (ref 39.0–75.0)
NEUTROS ABS: 3.2 10*3/uL (ref 1.5–6.5)
Platelets: 285 10*3/uL (ref 140–400)
RBC: 3.95 10*6/uL — ABNORMAL LOW (ref 4.20–5.82)
RDW: 16.2 % — ABNORMAL HIGH (ref 11.0–14.6)
WBC: 6.9 10*3/uL (ref 4.0–10.3)
nRBC: 0 % (ref 0–0)

## 2013-07-30 LAB — PROTIME-INR
INR: 3.3 (ref 2.00–3.50)
Protime: 39.6 Seconds — ABNORMAL HIGH (ref 10.6–13.4)

## 2013-07-30 LAB — POCT INR: INR: 3.3

## 2013-07-30 MED ORDER — CYANOCOBALAMIN 1000 MCG/ML IJ SOLN
1000.0000 ug | Freq: Once | INTRAMUSCULAR | Status: AC
  Administered 2013-07-30: 1000 ug via INTRAMUSCULAR

## 2013-07-30 NOTE — Progress Notes (Signed)
INR = 3.3      Goal 2-3 INR just above goal range. No bleeding/brusing noted. No missed or extra doses. He will continue the same dose of 5 mg (1 tablet) daily except for 2.5 mg (half tablet) on Monday, Wednesday, and Friday. He will return 08/09/13 at 10:00am for lab and 10:15am for Coumadin clinic. His daughter requests refill authorization for Coumadin. Called to [facility]:  Coumadin 2.5 mg, quantity 60 with 3 refills.  Theone Murdoch, PharmD

## 2013-08-06 ENCOUNTER — Other Ambulatory Visit: Payer: Self-pay | Admitting: *Deleted

## 2013-08-06 MED ORDER — COLCHICINE 0.6 MG PO TABS: 0.3000 mg | ORAL_TABLET | Freq: Every day | ORAL | Status: DC

## 2013-08-06 MED ORDER — TAMSULOSIN HCL 0.4 MG PO CAPS: 0.4000 mg | ORAL_CAPSULE | Freq: Every day | ORAL | Status: DC

## 2013-08-09 ENCOUNTER — Other Ambulatory Visit: Payer: Self-pay | Admitting: *Deleted

## 2013-08-09 ENCOUNTER — Telehealth: Payer: Self-pay | Admitting: *Deleted

## 2013-08-09 ENCOUNTER — Ambulatory Visit (HOSPITAL_BASED_OUTPATIENT_CLINIC_OR_DEPARTMENT_OTHER): Payer: Medicare Other | Admitting: Pharmacist

## 2013-08-09 ENCOUNTER — Other Ambulatory Visit (HOSPITAL_BASED_OUTPATIENT_CLINIC_OR_DEPARTMENT_OTHER): Payer: Medicare Other

## 2013-08-09 DIAGNOSIS — I82409 Acute embolism and thrombosis of unspecified deep veins of unspecified lower extremity: Secondary | ICD-10-CM

## 2013-08-09 LAB — PROTIME-INR
INR: 1.9 — ABNORMAL LOW (ref 2.00–3.50)
Protime: 22.8 Seconds — ABNORMAL HIGH (ref 10.6–13.4)

## 2013-08-09 LAB — POCT INR: INR: 1.9

## 2013-08-09 NOTE — Progress Notes (Signed)
INR = 1.9 on Coumadin 5 mg daily except 2.5 mg on MWF No missed doses. No calf pain/tenderness.  No SOB or CP. Found tick on RLQ ("belt line") today.  Detached it completely.  Asymptomatic for fever, rash, HA. Pt dtr requested tamsulosin & colchicine RX's be called in to [facility] [address] due to problems at [facility].  I will relay this msg to Dr. Virgie Dad RN. INR slightly below goal. No change to dose since his INR has been a little high the last couple of visits. Return 08/27/13 for protime check & inj appt. Kennith Center, Pharm.D., CPP 08/09/2013@10 :47 AM

## 2013-08-09 NOTE — Telephone Encounter (Signed)
This RN was informed by pharmacist with coumadin clinic that daughter is requesting additional refills of meds this RN refilled on 5/29 [facility] [address].  This RN informed pharmacist refills were given per courtesy due to daughter did not contact urology office and was at the pharmacy at the time. This RN informed daughter she would need to get additional refills from urologist at [facility].  This RN reviewed pt's chart and noted pt is seen by Dr Nicki Reaper McDiarmid at [facility].

## 2013-08-26 ENCOUNTER — Other Ambulatory Visit (HOSPITAL_BASED_OUTPATIENT_CLINIC_OR_DEPARTMENT_OTHER): Payer: Medicare Other

## 2013-08-26 ENCOUNTER — Ambulatory Visit (HOSPITAL_BASED_OUTPATIENT_CLINIC_OR_DEPARTMENT_OTHER): Payer: Medicare Other

## 2013-08-26 ENCOUNTER — Ambulatory Visit: Payer: Medicare Other

## 2013-08-26 ENCOUNTER — Ambulatory Visit (HOSPITAL_BASED_OUTPATIENT_CLINIC_OR_DEPARTMENT_OTHER): Payer: Self-pay | Admitting: Pharmacist

## 2013-08-26 VITALS — BP 134/66 | HR 69 | Temp 97.7°F

## 2013-08-26 DIAGNOSIS — I82409 Acute embolism and thrombosis of unspecified deep veins of unspecified lower extremity: Secondary | ICD-10-CM

## 2013-08-26 DIAGNOSIS — D649 Anemia, unspecified: Secondary | ICD-10-CM

## 2013-08-26 DIAGNOSIS — D510 Vitamin B12 deficiency anemia due to intrinsic factor deficiency: Secondary | ICD-10-CM

## 2013-08-26 LAB — CBC WITH DIFFERENTIAL/PLATELET
BASO%: 0.9 % (ref 0.0–2.0)
BASOS ABS: 0.1 10*3/uL (ref 0.0–0.1)
EOS%: 7.3 % — AB (ref 0.0–7.0)
Eosinophils Absolute: 0.5 10*3/uL (ref 0.0–0.5)
HEMATOCRIT: 32.4 % — AB (ref 38.4–49.9)
HEMOGLOBIN: 11.3 g/dL — AB (ref 13.0–17.1)
LYMPH#: 2.6 10*3/uL (ref 0.9–3.3)
LYMPH%: 38.3 % (ref 14.0–49.0)
MCH: 27.9 pg (ref 27.2–33.4)
MCHC: 34.9 g/dL (ref 32.0–36.0)
MCV: 80 fL (ref 79.3–98.0)
MONO#: 0.6 10*3/uL (ref 0.1–0.9)
MONO%: 8.2 % (ref 0.0–14.0)
NEUT#: 3.1 10*3/uL (ref 1.5–6.5)
NEUT%: 45.3 % (ref 39.0–75.0)
Platelets: 257 10*3/uL (ref 140–400)
RBC: 4.05 10*6/uL — ABNORMAL LOW (ref 4.20–5.82)
RDW: 15.4 % — ABNORMAL HIGH (ref 11.0–14.6)
WBC: 6.8 10*3/uL (ref 4.0–10.3)

## 2013-08-26 LAB — PROTIME-INR
INR: 2 (ref 2.00–3.50)
Protime: 24 Seconds — ABNORMAL HIGH (ref 10.6–13.4)

## 2013-08-26 LAB — POCT INR: INR: 2

## 2013-08-26 MED ORDER — CYANOCOBALAMIN 1000 MCG/ML IJ SOLN
1000.0000 ug | Freq: Once | INTRAMUSCULAR | Status: AC
  Administered 2013-08-26: 1000 ug via INTRAMUSCULAR

## 2013-08-26 NOTE — Progress Notes (Signed)
INR at goal Pt is doing well Seen today with his daughter No issues to report No bleeding or bruising No missed or extra doses No medication or diet changes Plan: No changes Continue same dose of 5 mg (1 tablet) daily except for 2.5 mg (half tablet) on Monday, Wednesday, and Friday.  Return 09/24/13 at 10:15 am for lab and 10:30 am for coumadin clinic & 10:45 am for injection appt. It was a pleasure to see Mr. Schlicher and his daughter today in clinic

## 2013-08-26 NOTE — Patient Instructions (Signed)
INR at goal No changes Continue same dose of 5 mg (1 tablet) daily except for 2.5 mg (half tablet) on Monday, Wednesday, and Friday.  Return 09/24/13 at 10:15 am for lab and 10:30 am for coumadin clinic & 10:45 am for injection appt.

## 2013-08-27 ENCOUNTER — Other Ambulatory Visit: Payer: Medicare Other

## 2013-08-27 ENCOUNTER — Ambulatory Visit: Payer: Medicare Other

## 2013-09-24 ENCOUNTER — Ambulatory Visit (HOSPITAL_BASED_OUTPATIENT_CLINIC_OR_DEPARTMENT_OTHER): Payer: Medicare Other

## 2013-09-24 ENCOUNTER — Other Ambulatory Visit (HOSPITAL_BASED_OUTPATIENT_CLINIC_OR_DEPARTMENT_OTHER): Payer: Medicare Other

## 2013-09-24 ENCOUNTER — Ambulatory Visit (HOSPITAL_BASED_OUTPATIENT_CLINIC_OR_DEPARTMENT_OTHER): Payer: Self-pay | Admitting: Pharmacist

## 2013-09-24 VITALS — BP 156/63 | HR 67 | Temp 97.7°F

## 2013-09-24 DIAGNOSIS — I82409 Acute embolism and thrombosis of unspecified deep veins of unspecified lower extremity: Secondary | ICD-10-CM

## 2013-09-24 DIAGNOSIS — D519 Vitamin B12 deficiency anemia, unspecified: Secondary | ICD-10-CM

## 2013-09-24 DIAGNOSIS — D510 Vitamin B12 deficiency anemia due to intrinsic factor deficiency: Secondary | ICD-10-CM

## 2013-09-24 LAB — CBC WITH DIFFERENTIAL/PLATELET
BASO%: 0.3 % (ref 0.0–2.0)
BASOS ABS: 0 10*3/uL (ref 0.0–0.1)
EOS%: 4.3 % (ref 0.0–7.0)
Eosinophils Absolute: 0.4 10*3/uL (ref 0.0–0.5)
HCT: 35.5 % — ABNORMAL LOW (ref 38.4–49.9)
HEMOGLOBIN: 11.8 g/dL — AB (ref 13.0–17.1)
LYMPH#: 3.2 10*3/uL (ref 0.9–3.3)
LYMPH%: 36.8 % (ref 14.0–49.0)
MCH: 27.1 pg — AB (ref 27.2–33.4)
MCHC: 33.3 g/dL (ref 32.0–36.0)
MCV: 81.4 fL (ref 79.3–98.0)
MONO#: 0.7 10*3/uL (ref 0.1–0.9)
MONO%: 8.2 % (ref 0.0–14.0)
NEUT#: 4.4 10*3/uL (ref 1.5–6.5)
NEUT%: 50.4 % (ref 39.0–75.0)
Platelets: 292 10*3/uL (ref 140–400)
RBC: 4.36 10*6/uL (ref 4.20–5.82)
RDW: 16.4 % — AB (ref 11.0–14.6)
WBC: 8.6 10*3/uL (ref 4.0–10.3)

## 2013-09-24 LAB — PROTHROMBIN TIME
INR: 4.59 — ABNORMAL HIGH (ref <1.50)
Prothrombin Time: 43.4 seconds — ABNORMAL HIGH (ref 11.6–15.2)

## 2013-09-24 LAB — PROTIME-INR

## 2013-09-24 LAB — POCT INR: INR: 4.59

## 2013-09-24 MED ORDER — CYANOCOBALAMIN 1000 MCG/ML IJ SOLN
1000.0000 ug | Freq: Once | INTRAMUSCULAR | Status: AC
  Administered 2013-09-24: 1000 ug via INTRAMUSCULAR

## 2013-09-24 NOTE — Progress Notes (Signed)
INR = 4.59 (sent out for confirmation; venopuncture) Pt reports no bleeding/bruising. No new meds or med changes recently. He denies any EtOH in the past week. Everything has been stable up to this point on Coumadin 5 mg daily; 2.5 mg on MWF. He already took his Coumadin today.  His sister fills his pill box up for him. INR supratherapeutic.  Hold Coumadin tomorrow (7/18) and Sunday (7/19) then resume previous dose. Return Wed (7/22) for another INR. Kennith Center, Pharm.D., CPP 09/24/2013@11 :56 AM

## 2013-09-29 ENCOUNTER — Telehealth: Payer: Self-pay | Admitting: Oncology

## 2013-09-29 ENCOUNTER — Other Ambulatory Visit: Payer: Self-pay

## 2013-09-29 ENCOUNTER — Other Ambulatory Visit (HOSPITAL_BASED_OUTPATIENT_CLINIC_OR_DEPARTMENT_OTHER): Payer: Medicare Other

## 2013-09-29 ENCOUNTER — Ambulatory Visit (HOSPITAL_BASED_OUTPATIENT_CLINIC_OR_DEPARTMENT_OTHER): Payer: Self-pay | Admitting: Pharmacist

## 2013-09-29 DIAGNOSIS — I82409 Acute embolism and thrombosis of unspecified deep veins of unspecified lower extremity: Secondary | ICD-10-CM

## 2013-09-29 DIAGNOSIS — M1008 Idiopathic gout, vertebrae: Secondary | ICD-10-CM

## 2013-09-29 LAB — POCT INR: INR: 1.8

## 2013-09-29 LAB — PROTIME-INR
INR: 1.8 — ABNORMAL LOW (ref 2.00–3.50)
PROTIME: 21.6 s — AB (ref 10.6–13.4)

## 2013-09-29 MED ORDER — COLCHICINE 0.6 MG PO TABS: 0.3000 mg | ORAL_TABLET | Freq: Every day | ORAL | Status: DC

## 2013-09-29 NOTE — Progress Notes (Signed)
INR right at goal  Pt held coumadin over the weekend on Saturday and Sunday as instructed and restarted on Monday with his usual regimen Pt is doing well with no complaints Pt seen in clinic today with his daughter Pt reports no bleeding or bruising No diet or medication changes This dose of coumadin has been working for patient, will see if INR can now stabilize again Plan: No Changes Continue 5 mg (1 tablet) daily except for 2.5 mg (half tablet) on Monday, Wednesday, and Friday.  Return Wed 10/22/13 for lab at 8:30 am & 8:45 am for Coumadin clinic. Followed by injection

## 2013-09-29 NOTE — Patient Instructions (Signed)
INR right at goal No Changes Continue 5 mg (1 tablet) daily except for 2.5 mg (half tablet) on Monday, Wednesday, and Friday.  Return Wed 10/22/13 for lab at 8:30 am & 8:45 am for Coumadin clinic. Followed by injection

## 2013-09-29 NOTE — Telephone Encounter (Signed)
per pof to sch pt CC & lab-per Chris-pt aware

## 2013-09-29 NOTE — Telephone Encounter (Signed)
Let Ms. Nichols know colchicine refill would be sent to [facility].  Pt voiced understanding.    Order placed.  Receipt confirmed by pharmacy.

## 2013-09-30 MED ORDER — WARFARIN SODIUM 5 MG PO TABS: ORAL_TABLET | ORAL | Status: DC

## 2013-09-30 NOTE — Addendum Note (Signed)
Addended by: Gerrit Halls T on: 09/30/2013 12:44 PM   Modules accepted: Orders

## 2013-10-01 ENCOUNTER — Other Ambulatory Visit: Payer: Self-pay | Admitting: *Deleted

## 2013-10-01 DIAGNOSIS — I82409 Acute embolism and thrombosis of unspecified deep veins of unspecified lower extremity: Secondary | ICD-10-CM

## 2013-10-01 MED ORDER — WARFARIN SODIUM 5 MG PO TABS: ORAL_TABLET | ORAL | Status: DC

## 2013-10-22 ENCOUNTER — Other Ambulatory Visit (HOSPITAL_BASED_OUTPATIENT_CLINIC_OR_DEPARTMENT_OTHER): Payer: Medicare Other

## 2013-10-22 ENCOUNTER — Ambulatory Visit (HOSPITAL_BASED_OUTPATIENT_CLINIC_OR_DEPARTMENT_OTHER): Payer: Self-pay | Admitting: Pharmacist

## 2013-10-22 ENCOUNTER — Ambulatory Visit (HOSPITAL_BASED_OUTPATIENT_CLINIC_OR_DEPARTMENT_OTHER): Payer: Medicare Other

## 2013-10-22 ENCOUNTER — Other Ambulatory Visit: Payer: Medicare Other

## 2013-10-22 ENCOUNTER — Ambulatory Visit: Payer: Medicare Other

## 2013-10-22 DIAGNOSIS — D510 Vitamin B12 deficiency anemia due to intrinsic factor deficiency: Secondary | ICD-10-CM

## 2013-10-22 DIAGNOSIS — D509 Iron deficiency anemia, unspecified: Secondary | ICD-10-CM

## 2013-10-22 DIAGNOSIS — I82409 Acute embolism and thrombosis of unspecified deep veins of unspecified lower extremity: Secondary | ICD-10-CM

## 2013-10-22 LAB — CBC WITH DIFFERENTIAL/PLATELET
BASO%: 1.4 % (ref 0.0–2.0)
Basophils Absolute: 0.1 10*3/uL (ref 0.0–0.1)
EOS%: 7.4 % — ABNORMAL HIGH (ref 0.0–7.0)
Eosinophils Absolute: 0.5 10*3/uL (ref 0.0–0.5)
HEMATOCRIT: 34.5 % — AB (ref 38.4–49.9)
HGB: 11.4 g/dL — ABNORMAL LOW (ref 13.0–17.1)
LYMPH#: 2.7 10*3/uL (ref 0.9–3.3)
LYMPH%: 38.2 % (ref 14.0–49.0)
MCH: 27.3 pg (ref 27.2–33.4)
MCHC: 33.2 g/dL (ref 32.0–36.0)
MCV: 82.3 fL (ref 79.3–98.0)
MONO#: 0.5 10*3/uL (ref 0.1–0.9)
MONO%: 7 % (ref 0.0–14.0)
NEUT%: 46 % (ref 39.0–75.0)
NEUTROS ABS: 3.3 10*3/uL (ref 1.5–6.5)
PLATELETS: 418 10*3/uL — AB (ref 140–400)
RBC: 4.19 10*6/uL — ABNORMAL LOW (ref 4.20–5.82)
RDW: 15.8 % — ABNORMAL HIGH (ref 11.0–14.6)
WBC: 7.1 10*3/uL (ref 4.0–10.3)

## 2013-10-22 LAB — PROTIME-INR
INR: 3.3 (ref 2.00–3.50)
Protime: 39.6 Seconds — ABNORMAL HIGH (ref 10.6–13.4)

## 2013-10-22 LAB — POCT INR: INR: 3.3

## 2013-10-22 MED ORDER — CYANOCOBALAMIN 1000 MCG/ML IJ SOLN
1000.0000 ug | Freq: Once | INTRAMUSCULAR | Status: AC
  Administered 2013-10-22: 1000 ug via INTRAMUSCULAR

## 2013-10-22 NOTE — Patient Instructions (Signed)
INR just above goal today Next week slightly decrease dose to 1 tablet on Mondays, Wednesdays and Fridays and 1/2 tablet all other days  Return Wed 11/19/13 for lab at 8:30 am & 8:45 am for Coumadin clinic. Followed by injection

## 2013-10-22 NOTE — Progress Notes (Signed)
INR just above goal today Pt seen with daughter Pt is doing well with no complaints No unusual bleeding or bruising No missed or extra doses No medication or diet changes Since INR trending upwards will slightly decrease dose  Plan: Starting on Sunday slightly decrease dose to 1 tablet on Mondays, Wednesdays and Fridays and 1/2 tablet all other days  Return Wed 11/19/13 for lab at 8:30 am & 8:45 am for Coumadin clinic. Followed by injection

## 2013-10-24 ENCOUNTER — Telehealth: Payer: Self-pay | Admitting: Oncology

## 2013-10-24 NOTE — Telephone Encounter (Signed)
per Cc to sch CC & lab-inj-sch -pt aware

## 2013-11-19 ENCOUNTER — Ambulatory Visit (HOSPITAL_BASED_OUTPATIENT_CLINIC_OR_DEPARTMENT_OTHER): Payer: Medicare Other | Admitting: Pharmacist

## 2013-11-19 ENCOUNTER — Ambulatory Visit (HOSPITAL_BASED_OUTPATIENT_CLINIC_OR_DEPARTMENT_OTHER): Payer: Medicare Other

## 2013-11-19 ENCOUNTER — Other Ambulatory Visit: Payer: Medicare Other

## 2013-11-19 ENCOUNTER — Ambulatory Visit: Payer: Medicare Other

## 2013-11-19 ENCOUNTER — Other Ambulatory Visit (HOSPITAL_BASED_OUTPATIENT_CLINIC_OR_DEPARTMENT_OTHER): Payer: Medicare Other

## 2013-11-19 VITALS — BP 193/75 | HR 62 | Temp 97.4°F

## 2013-11-19 DIAGNOSIS — D513 Other dietary vitamin B12 deficiency anemia: Secondary | ICD-10-CM

## 2013-11-19 DIAGNOSIS — I82409 Acute embolism and thrombosis of unspecified deep veins of unspecified lower extremity: Secondary | ICD-10-CM

## 2013-11-19 DIAGNOSIS — D510 Vitamin B12 deficiency anemia due to intrinsic factor deficiency: Secondary | ICD-10-CM

## 2013-11-19 LAB — CBC WITH DIFFERENTIAL/PLATELET
BASO%: 1 % (ref 0.0–2.0)
BASOS ABS: 0.1 10*3/uL (ref 0.0–0.1)
EOS ABS: 0.5 10*3/uL (ref 0.0–0.5)
EOS%: 6.1 % (ref 0.0–7.0)
HEMATOCRIT: 31.1 % — AB (ref 38.4–49.9)
HEMOGLOBIN: 11.1 g/dL — AB (ref 13.0–17.1)
LYMPH%: 38.4 % (ref 14.0–49.0)
MCH: 28.7 pg (ref 27.2–33.4)
MCHC: 35.7 g/dL (ref 32.0–36.0)
MCV: 80.4 fL (ref 79.3–98.0)
MONO#: 0.7 10*3/uL (ref 0.1–0.9)
MONO%: 8.7 % (ref 0.0–14.0)
NEUT#: 3.7 10*3/uL (ref 1.5–6.5)
NEUT%: 45.8 % (ref 39.0–75.0)
PLATELETS: 300 10*3/uL (ref 140–400)
RBC: 3.87 10*6/uL — ABNORMAL LOW (ref 4.20–5.82)
RDW: 15.4 % — ABNORMAL HIGH (ref 11.0–14.6)
WBC: 8.2 10*3/uL (ref 4.0–10.3)
lymph#: 3.1 10*3/uL (ref 0.9–3.3)
nRBC: 0 % (ref 0–0)

## 2013-11-19 LAB — POCT INR: INR: 1.5

## 2013-11-19 LAB — PROTIME-INR
INR: 1.5 — ABNORMAL LOW (ref 2.00–3.50)
Protime: 18 Seconds — ABNORMAL HIGH (ref 10.6–13.4)

## 2013-11-19 MED ORDER — CYANOCOBALAMIN 1000 MCG/ML IJ SOLN
1000.0000 ug | Freq: Once | INTRAMUSCULAR | Status: AC
  Administered 2013-11-19: 1000 ug via INTRAMUSCULAR

## 2013-11-19 NOTE — Progress Notes (Signed)
INR = 1.5     Goal 2-3 INR below goal but patient states he ate cabbage yesterday and this is unusual for him. Per original instructions, patient was to complete 6 months of Coumadin. Coumadin was started in March, we are now at 6 months. Verified with Dr. Jana Hakim that he wants to end tx at this time. Told patient and daughter that he should no longer take Coumadin. They state understanding and have marked warfarin off his medication list.  Theone Murdoch, PharmD

## 2013-11-19 NOTE — Addendum Note (Signed)
Addended by: Arbutus Ped on: 11/19/2013 09:18 AM   Modules accepted: Orders

## 2013-12-17 ENCOUNTER — Other Ambulatory Visit (HOSPITAL_BASED_OUTPATIENT_CLINIC_OR_DEPARTMENT_OTHER): Payer: Medicare Other

## 2013-12-17 ENCOUNTER — Ambulatory Visit (HOSPITAL_BASED_OUTPATIENT_CLINIC_OR_DEPARTMENT_OTHER): Payer: Medicare Other

## 2013-12-17 VITALS — BP 151/70 | HR 69 | Temp 97.7°F

## 2013-12-17 DIAGNOSIS — D510 Vitamin B12 deficiency anemia due to intrinsic factor deficiency: Secondary | ICD-10-CM

## 2013-12-17 DIAGNOSIS — I82401 Acute embolism and thrombosis of unspecified deep veins of right lower extremity: Secondary | ICD-10-CM

## 2013-12-17 DIAGNOSIS — D509 Iron deficiency anemia, unspecified: Secondary | ICD-10-CM

## 2013-12-17 LAB — CBC WITH DIFFERENTIAL/PLATELET
BASO%: 1 % (ref 0.0–2.0)
Basophils Absolute: 0.1 10e3/uL (ref 0.0–0.1)
EOS%: 6.9 % (ref 0.0–7.0)
Eosinophils Absolute: 0.6 10e3/uL — ABNORMAL HIGH (ref 0.0–0.5)
HCT: 30.9 % — ABNORMAL LOW (ref 38.4–49.9)
HGB: 10.8 g/dL — ABNORMAL LOW (ref 13.0–17.1)
LYMPH%: 44.8 % (ref 14.0–49.0)
MCH: 28.3 pg (ref 27.2–33.4)
MCHC: 35 g/dL (ref 32.0–36.0)
MCV: 81.1 fL (ref 79.3–98.0)
MONO#: 0.5 10e3/uL (ref 0.1–0.9)
MONO%: 6.2 % (ref 0.0–14.0)
NEUT#: 3.3 10e3/uL (ref 1.5–6.5)
NEUT%: 41.1 % (ref 39.0–75.0)
Platelets: 261 10e3/uL (ref 140–400)
RBC: 3.81 10e6/uL — ABNORMAL LOW (ref 4.20–5.82)
RDW: 14.9 % — ABNORMAL HIGH (ref 11.0–14.6)
WBC: 7.9 10e3/uL (ref 4.0–10.3)
lymph#: 3.6 10e3/uL — ABNORMAL HIGH (ref 0.9–3.3)
nRBC: 0 % (ref 0–0)

## 2013-12-17 LAB — PROTIME-INR
INR: 1 — AB (ref 2.00–3.50)
Protime: 12 Seconds (ref 10.6–13.4)

## 2013-12-17 MED ORDER — CYANOCOBALAMIN 1000 MCG/ML IJ SOLN
1000.0000 ug | Freq: Once | INTRAMUSCULAR | Status: AC
  Administered 2013-12-17: 1000 ug via INTRAMUSCULAR

## 2013-12-28 ENCOUNTER — Other Ambulatory Visit: Payer: Self-pay | Admitting: *Deleted

## 2013-12-28 DIAGNOSIS — M1008 Idiopathic gout, vertebrae: Secondary | ICD-10-CM

## 2013-12-28 MED ORDER — COLCHICINE 0.6 MG PO TABS: 0.3000 mg | ORAL_TABLET | Freq: Every day | ORAL | Status: DC

## 2014-01-14 ENCOUNTER — Other Ambulatory Visit (HOSPITAL_BASED_OUTPATIENT_CLINIC_OR_DEPARTMENT_OTHER): Payer: Medicare Other

## 2014-01-14 ENCOUNTER — Ambulatory Visit (HOSPITAL_BASED_OUTPATIENT_CLINIC_OR_DEPARTMENT_OTHER): Payer: Medicare Other

## 2014-01-14 DIAGNOSIS — D510 Vitamin B12 deficiency anemia due to intrinsic factor deficiency: Secondary | ICD-10-CM

## 2014-01-14 DIAGNOSIS — I82401 Acute embolism and thrombosis of unspecified deep veins of right lower extremity: Secondary | ICD-10-CM

## 2014-01-14 DIAGNOSIS — D649 Anemia, unspecified: Secondary | ICD-10-CM

## 2014-01-14 LAB — CBC WITH DIFFERENTIAL/PLATELET
BASO%: 1 % (ref 0.0–2.0)
Basophils Absolute: 0.1 10*3/uL (ref 0.0–0.1)
EOS%: 6.3 % (ref 0.0–7.0)
Eosinophils Absolute: 0.6 10*3/uL — ABNORMAL HIGH (ref 0.0–0.5)
HEMATOCRIT: 34.3 % — AB (ref 38.4–49.9)
HEMOGLOBIN: 12 g/dL — AB (ref 13.0–17.1)
LYMPH#: 3.5 10*3/uL — AB (ref 0.9–3.3)
LYMPH%: 40 % (ref 14.0–49.0)
MCH: 28.4 pg (ref 27.2–33.4)
MCHC: 35 g/dL (ref 32.0–36.0)
MCV: 81.1 fL (ref 79.3–98.0)
MONO#: 0.6 10*3/uL (ref 0.1–0.9)
MONO%: 7.3 % (ref 0.0–14.0)
NEUT#: 3.9 10*3/uL (ref 1.5–6.5)
NEUT%: 45.4 % (ref 39.0–75.0)
PLATELETS: 299 10*3/uL (ref 140–400)
RBC: 4.23 10*6/uL (ref 4.20–5.82)
RDW: 14.6 % (ref 11.0–14.6)
WBC: 8.7 10*3/uL (ref 4.0–10.3)
nRBC: 0 % (ref 0–0)

## 2014-01-14 LAB — PROTIME-INR
INR: 1.1 — AB (ref 2.00–3.50)
Protime: 13.2 Seconds (ref 10.6–13.4)

## 2014-01-14 MED ORDER — FUROSEMIDE 20 MG PO TABS
ORAL_TABLET | ORAL | Status: AC
  Filled 2014-01-14: qty 1

## 2014-01-14 MED ORDER — CYANOCOBALAMIN 1000 MCG/ML IJ SOLN
1000.0000 ug | Freq: Once | INTRAMUSCULAR | Status: AC
  Administered 2014-01-14: 1000 ug via INTRAMUSCULAR

## 2014-01-14 MED ORDER — FUROSEMIDE 20 MG PO TABS: 20.0000 mg | ORAL_TABLET | Freq: Once | ORAL | Status: DC

## 2014-01-14 NOTE — Progress Notes (Signed)
Rechecked patients VS.  B/P down to 159/77. Lasix not given.  Notified Jaci Carrel RN @ Dr. Jana Hakim.  In addition, reviewed with patient and his dtr. That he needs to take his blood pressure medicine and all of his regular medicine that he has not taken, when he gets home this am.  Pt. And dtr. Verbalize understanding.

## 2014-02-11 ENCOUNTER — Other Ambulatory Visit (HOSPITAL_BASED_OUTPATIENT_CLINIC_OR_DEPARTMENT_OTHER): Payer: Medicare Other

## 2014-02-11 ENCOUNTER — Ambulatory Visit (HOSPITAL_BASED_OUTPATIENT_CLINIC_OR_DEPARTMENT_OTHER): Payer: Medicare Other

## 2014-02-11 DIAGNOSIS — M1008 Idiopathic gout, vertebrae: Secondary | ICD-10-CM

## 2014-02-11 DIAGNOSIS — N179 Acute kidney failure, unspecified: Secondary | ICD-10-CM

## 2014-02-11 DIAGNOSIS — I82401 Acute embolism and thrombosis of unspecified deep veins of right lower extremity: Secondary | ICD-10-CM

## 2014-02-11 DIAGNOSIS — D649 Anemia, unspecified: Secondary | ICD-10-CM

## 2014-02-11 DIAGNOSIS — D510 Vitamin B12 deficiency anemia due to intrinsic factor deficiency: Secondary | ICD-10-CM

## 2014-02-11 DIAGNOSIS — M7989 Other specified soft tissue disorders: Secondary | ICD-10-CM

## 2014-02-11 DIAGNOSIS — I10 Essential (primary) hypertension: Secondary | ICD-10-CM

## 2014-02-11 DIAGNOSIS — R630 Anorexia: Secondary | ICD-10-CM

## 2014-02-11 DIAGNOSIS — R339 Retention of urine, unspecified: Secondary | ICD-10-CM

## 2014-02-11 DIAGNOSIS — I82409 Acute embolism and thrombosis of unspecified deep veins of unspecified lower extremity: Secondary | ICD-10-CM

## 2014-02-11 DIAGNOSIS — N189 Chronic kidney disease, unspecified: Principal | ICD-10-CM

## 2014-02-11 LAB — CBC WITH DIFFERENTIAL/PLATELET
BASO%: 1.2 % (ref 0.0–2.0)
Basophils Absolute: 0.1 10*3/uL (ref 0.0–0.1)
EOS%: 6.9 % (ref 0.0–7.0)
Eosinophils Absolute: 0.7 10*3/uL — ABNORMAL HIGH (ref 0.0–0.5)
HCT: 32.7 % — ABNORMAL LOW (ref 38.4–49.9)
HGB: 11.5 g/dL — ABNORMAL LOW (ref 13.0–17.1)
LYMPH#: 3 10*3/uL (ref 0.9–3.3)
LYMPH%: 32 % (ref 14.0–49.0)
MCH: 28.5 pg (ref 27.2–33.4)
MCHC: 35.2 g/dL (ref 32.0–36.0)
MCV: 81.1 fL (ref 79.3–98.0)
MONO#: 0.7 10*3/uL (ref 0.1–0.9)
MONO%: 7.5 % (ref 0.0–14.0)
NEUT#: 5 10*3/uL (ref 1.5–6.5)
NEUT%: 52.4 % (ref 39.0–75.0)
NRBC: 0 % (ref 0–0)
Platelets: 319 10*3/uL (ref 140–400)
RBC: 4.03 10*6/uL — ABNORMAL LOW (ref 4.20–5.82)
RDW: 14.6 % (ref 11.0–14.6)
WBC: 9.5 10*3/uL (ref 4.0–10.3)

## 2014-02-11 LAB — PROTIME-INR
INR: 1.1 — AB (ref 2.00–3.50)
Protime: 13.2 Seconds (ref 10.6–13.4)

## 2014-02-11 MED ORDER — CYANOCOBALAMIN 1000 MCG/ML IJ SOLN
1000.0000 ug | Freq: Once | INTRAMUSCULAR | Status: AC
  Administered 2014-02-11: 1000 ug via INTRAMUSCULAR

## 2014-02-11 NOTE — Patient Instructions (Signed)

## 2014-02-21 ENCOUNTER — Other Ambulatory Visit: Payer: Self-pay | Admitting: Oncology

## 2014-02-21 DIAGNOSIS — M1000 Idiopathic gout, unspecified site: Secondary | ICD-10-CM

## 2014-02-21 DIAGNOSIS — D510 Vitamin B12 deficiency anemia due to intrinsic factor deficiency: Secondary | ICD-10-CM

## 2014-02-24 ENCOUNTER — Other Ambulatory Visit: Payer: Self-pay | Admitting: *Deleted

## 2014-03-10 ENCOUNTER — Other Ambulatory Visit: Payer: Medicare Other

## 2014-03-10 ENCOUNTER — Ambulatory Visit: Payer: Medicare Other

## 2014-04-08 ENCOUNTER — Other Ambulatory Visit: Payer: Self-pay | Admitting: Oncology

## 2014-04-08 ENCOUNTER — Other Ambulatory Visit (HOSPITAL_BASED_OUTPATIENT_CLINIC_OR_DEPARTMENT_OTHER): Payer: Medicare Other

## 2014-04-08 ENCOUNTER — Ambulatory Visit (HOSPITAL_BASED_OUTPATIENT_CLINIC_OR_DEPARTMENT_OTHER): Payer: Medicare Other

## 2014-04-08 DIAGNOSIS — I82401 Acute embolism and thrombosis of unspecified deep veins of right lower extremity: Secondary | ICD-10-CM

## 2014-04-08 DIAGNOSIS — D510 Vitamin B12 deficiency anemia due to intrinsic factor deficiency: Secondary | ICD-10-CM

## 2014-04-08 DIAGNOSIS — D508 Other iron deficiency anemias: Secondary | ICD-10-CM

## 2014-04-08 LAB — CBC WITH DIFFERENTIAL/PLATELET
BASO%: 1.5 % (ref 0.0–2.0)
BASOS ABS: 0.1 10*3/uL (ref 0.0–0.1)
EOS%: 7.5 % — ABNORMAL HIGH (ref 0.0–7.0)
Eosinophils Absolute: 0.6 10*3/uL — ABNORMAL HIGH (ref 0.0–0.5)
HCT: 37.4 % — ABNORMAL LOW (ref 38.4–49.9)
HEMOGLOBIN: 12.1 g/dL — AB (ref 13.0–17.1)
LYMPH#: 3.5 10*3/uL — AB (ref 0.9–3.3)
LYMPH%: 43.6 % (ref 14.0–49.0)
MCH: 27.5 pg (ref 27.2–33.4)
MCHC: 32.3 g/dL (ref 32.0–36.0)
MCV: 85 fL (ref 79.3–98.0)
MONO#: 0.6 10*3/uL (ref 0.1–0.9)
MONO%: 6.8 % (ref 0.0–14.0)
NEUT#: 3.3 10*3/uL (ref 1.5–6.5)
NEUT%: 40.6 % (ref 39.0–75.0)
PLATELETS: 319 10*3/uL (ref 140–400)
RBC: 4.4 10*6/uL (ref 4.20–5.82)
RDW: 15.1 % — ABNORMAL HIGH (ref 11.0–14.6)
WBC: 8 10*3/uL (ref 4.0–10.3)

## 2014-04-08 LAB — PROTIME-INR
INR: 1 — AB (ref 2.00–3.50)
PROTIME: 12 s (ref 10.6–13.4)

## 2014-04-08 MED ORDER — CYANOCOBALAMIN 1000 MCG/ML IJ SOLN
1000.0000 ug | Freq: Once | INTRAMUSCULAR | Status: AC
  Administered 2014-04-08: 1000 ug via INTRAMUSCULAR

## 2014-04-13 ENCOUNTER — Other Ambulatory Visit: Payer: Self-pay | Admitting: Oncology

## 2014-04-15 ENCOUNTER — Telehealth: Payer: Self-pay

## 2014-04-15 NOTE — Telephone Encounter (Signed)
S/w bonita who takes care of her father. She stated Dr Jana Hakim started pt on Rapaflo. Pt has no other MD. Rapaflo refilled

## 2014-05-06 ENCOUNTER — Other Ambulatory Visit: Payer: Medicare Other

## 2014-05-06 ENCOUNTER — Ambulatory Visit: Payer: Medicare Other

## 2014-05-10 ENCOUNTER — Other Ambulatory Visit: Payer: Self-pay | Admitting: Oncology

## 2014-06-03 ENCOUNTER — Other Ambulatory Visit (HOSPITAL_BASED_OUTPATIENT_CLINIC_OR_DEPARTMENT_OTHER): Payer: Medicare Other

## 2014-06-03 ENCOUNTER — Ambulatory Visit (HOSPITAL_BASED_OUTPATIENT_CLINIC_OR_DEPARTMENT_OTHER): Payer: Medicare Other

## 2014-06-03 DIAGNOSIS — I82401 Acute embolism and thrombosis of unspecified deep veins of right lower extremity: Secondary | ICD-10-CM

## 2014-06-03 DIAGNOSIS — D510 Vitamin B12 deficiency anemia due to intrinsic factor deficiency: Secondary | ICD-10-CM

## 2014-06-03 DIAGNOSIS — D509 Iron deficiency anemia, unspecified: Secondary | ICD-10-CM

## 2014-06-03 LAB — CBC WITH DIFFERENTIAL/PLATELET
BASO%: 0.9 % (ref 0.0–2.0)
Basophils Absolute: 0.1 10*3/uL (ref 0.0–0.1)
EOS%: 6.2 % (ref 0.0–7.0)
Eosinophils Absolute: 0.5 10*3/uL (ref 0.0–0.5)
HCT: 33.6 % — ABNORMAL LOW (ref 38.4–49.9)
HEMOGLOBIN: 11.9 g/dL — AB (ref 13.0–17.1)
LYMPH#: 3.2 10*3/uL (ref 0.9–3.3)
LYMPH%: 42.7 % (ref 14.0–49.0)
MCH: 28.9 pg (ref 27.2–33.4)
MCHC: 35.4 g/dL (ref 32.0–36.0)
MCV: 81.6 fL (ref 79.3–98.0)
MONO#: 0.6 10*3/uL (ref 0.1–0.9)
MONO%: 8.3 % (ref 0.0–14.0)
NEUT#: 3.1 10*3/uL (ref 1.5–6.5)
NEUT%: 41.9 % (ref 39.0–75.0)
Platelets: 251 10*3/uL (ref 140–400)
RBC: 4.12 10*6/uL — ABNORMAL LOW (ref 4.20–5.82)
RDW: 14.7 % — AB (ref 11.0–14.6)
WBC: 7.5 10*3/uL (ref 4.0–10.3)
nRBC: 0 % (ref 0–0)

## 2014-06-03 LAB — PROTIME-INR
INR: 1 — AB (ref 2.00–3.50)
Protime: 12 Seconds (ref 10.6–13.4)

## 2014-06-03 MED ORDER — CYANOCOBALAMIN 1000 MCG/ML IJ SOLN
1000.0000 ug | Freq: Once | INTRAMUSCULAR | Status: AC
  Administered 2014-06-03: 1000 ug via INTRAMUSCULAR

## 2014-06-07 ENCOUNTER — Encounter: Payer: Self-pay | Admitting: Oncology

## 2014-06-07 NOTE — Progress Notes (Signed)
I sent prior auth req to [facility] for Rapaflo 8mg  capsule

## 2014-06-09 ENCOUNTER — Encounter: Payer: Self-pay | Admitting: Oncology

## 2014-06-09 NOTE — Progress Notes (Signed)
I advised Dr. Magrinat/Val insurance denied Rapaflo-prior Josem Kaufmann Per the [employer] he must try 2 of the below-before they will auth the Rapaflo. Prior-auth request was denied. Doxazosin Prazosin Terazosin

## 2014-06-10 ENCOUNTER — Other Ambulatory Visit: Payer: Self-pay | Admitting: Oncology

## 2014-06-10 ENCOUNTER — Telehealth: Payer: Self-pay | Admitting: *Deleted

## 2014-06-10 NOTE — Telephone Encounter (Signed)
This RN attempted to call pt's daughterDoroteo Hayes , regarding noted refill request for Rapaflo that is requiring prior authorization.  Noted last year this RN discussed with Caleb Hayes need for appropriate MD ( urologist and primary care ) to be contacted for refills due to need for follow up care.  Caleb Hayes called in early Feb to triage and states her father " does not have any other MD's ".  Per call today this RN obtained identified VM of daughter and detailed message left explaining need for her father to be re-established with Dr McDiarmid and a primary MD for appropriate refills and follow up care.  Pt is currently seen in this office for anemia and monthly B12 injections.

## 2014-06-10 NOTE — Telephone Encounter (Signed)
Daughter Doroteo Bradford called in reference to phone call from cancer center. Reiterated with daughter that patient needs to get re-established with Dr. McDiarmid and a PCP related to request for Rapaflo refill.

## 2014-06-30 ENCOUNTER — Other Ambulatory Visit: Payer: Self-pay | Admitting: *Deleted

## 2014-06-30 DIAGNOSIS — D649 Anemia, unspecified: Secondary | ICD-10-CM

## 2014-06-30 DIAGNOSIS — D510 Vitamin B12 deficiency anemia due to intrinsic factor deficiency: Secondary | ICD-10-CM

## 2014-07-01 ENCOUNTER — Other Ambulatory Visit (HOSPITAL_BASED_OUTPATIENT_CLINIC_OR_DEPARTMENT_OTHER): Payer: Medicare Other

## 2014-07-01 ENCOUNTER — Ambulatory Visit (HOSPITAL_BASED_OUTPATIENT_CLINIC_OR_DEPARTMENT_OTHER): Payer: Medicare Other | Admitting: Nurse Practitioner

## 2014-07-01 ENCOUNTER — Other Ambulatory Visit: Payer: Medicare Other

## 2014-07-01 ENCOUNTER — Telehealth: Payer: Self-pay | Admitting: Nurse Practitioner

## 2014-07-01 ENCOUNTER — Encounter: Payer: Self-pay | Admitting: Nurse Practitioner

## 2014-07-01 ENCOUNTER — Ambulatory Visit (HOSPITAL_BASED_OUTPATIENT_CLINIC_OR_DEPARTMENT_OTHER): Payer: Medicare Other

## 2014-07-01 VITALS — BP 163/64 | HR 68 | Temp 97.6°F | Resp 18 | Ht 69.0 in | Wt 163.2 lb

## 2014-07-01 DIAGNOSIS — Z86718 Personal history of other venous thrombosis and embolism: Secondary | ICD-10-CM | POA: Diagnosis not present

## 2014-07-01 DIAGNOSIS — D509 Iron deficiency anemia, unspecified: Secondary | ICD-10-CM

## 2014-07-01 DIAGNOSIS — N289 Disorder of kidney and ureter, unspecified: Secondary | ICD-10-CM

## 2014-07-01 DIAGNOSIS — M545 Low back pain: Secondary | ICD-10-CM

## 2014-07-01 DIAGNOSIS — D510 Vitamin B12 deficiency anemia due to intrinsic factor deficiency: Secondary | ICD-10-CM | POA: Diagnosis not present

## 2014-07-01 DIAGNOSIS — D519 Vitamin B12 deficiency anemia, unspecified: Secondary | ICD-10-CM

## 2014-07-01 DIAGNOSIS — D649 Anemia, unspecified: Secondary | ICD-10-CM

## 2014-07-01 LAB — COMPREHENSIVE METABOLIC PANEL (CC13)
ALK PHOS: 78 U/L (ref 40–150)
ALT: 13 U/L (ref 0–55)
AST: 24 U/L (ref 5–34)
Albumin: 3.5 g/dL (ref 3.5–5.0)
Anion Gap: 14 mEq/L — ABNORMAL HIGH (ref 3–11)
BUN: 51.8 mg/dL — AB (ref 7.0–26.0)
CHLORIDE: 109 meq/L (ref 98–109)
CO2: 18 meq/L — AB (ref 22–29)
Calcium: 9.3 mg/dL (ref 8.4–10.4)
Creatinine: 4.1 mg/dL (ref 0.7–1.3)
EGFR: 15 mL/min/{1.73_m2} — ABNORMAL LOW (ref >90)
GLUCOSE: 93 mg/dL (ref 70–140)
POTASSIUM: 4.1 meq/L (ref 3.5–5.1)
Sodium: 140 mEq/L (ref 136–145)
TOTAL PROTEIN: 7.8 g/dL (ref 6.4–8.3)
Total Bilirubin: 0.5 mg/dL (ref 0.20–1.20)

## 2014-07-01 LAB — CBC WITH DIFFERENTIAL/PLATELET
BASO%: 0.4 % (ref 0.0–2.0)
Basophils Absolute: 0 10*3/uL (ref 0.0–0.1)
EOS ABS: 0.5 10*3/uL (ref 0.0–0.5)
EOS%: 5.7 % (ref 0.0–7.0)
HEMATOCRIT: 35.1 % — AB (ref 38.4–49.9)
HEMOGLOBIN: 11.6 g/dL — AB (ref 13.0–17.1)
LYMPH%: 35 % (ref 14.0–49.0)
MCH: 27.7 pg (ref 27.2–33.4)
MCHC: 33.1 g/dL (ref 32.0–36.0)
MCV: 83.8 fL (ref 79.3–98.0)
MONO#: 0.6 10*3/uL (ref 0.1–0.9)
MONO%: 7.1 % (ref 0.0–14.0)
NEUT%: 51.8 % (ref 39.0–75.0)
NEUTROS ABS: 4.3 10*3/uL (ref 1.5–6.5)
PLATELETS: 261 10*3/uL (ref 140–400)
RBC: 4.19 10*6/uL — ABNORMAL LOW (ref 4.20–5.82)
RDW: 14.6 % (ref 11.0–14.6)
WBC: 8.2 10*3/uL (ref 4.0–10.3)
lymph#: 2.9 10*3/uL (ref 0.9–3.3)

## 2014-07-01 MED ORDER — CYANOCOBALAMIN 1000 MCG/ML IJ SOLN
1000.0000 ug | Freq: Once | INTRAMUSCULAR | Status: AC
  Administered 2014-07-01: 1000 ug via INTRAMUSCULAR

## 2014-07-01 NOTE — Telephone Encounter (Signed)
per pof to sch pt appt-gave pt copy of sch °

## 2014-07-01 NOTE — Progress Notes (Signed)
[facility]  Telephone:([phone]) [phone] Fax:(336) [phone]     ID: Caleb Hayes OB: 02-18-1930  MR#: [id number]  TGY#:[id number]  PCP: Pcp Not In System GYN:   SU:  OTHER MD:  CHIEF COMPLAINT: I feel weak"  HX PRESENT ILLNESS: Caleb Hayes presented to the emergency room on 05/16/2013 with complaints of weakness, fatigue, and lower extremity pain. Exam was unrevealing but lab work showed a creatinine of 4.34 with BUN 89. Potassium, sodium, calcium, and total protein were normal. The white cell count was 3.7, platelet count 75,000 and hemoglobin 7.3 with an MCV of 98. Ferritin was 3619, folate 14.4, and B12 160. Bilateral venous Dopplers were consistent with acute deep venous thrombosis involving the right common femoral vein, right popliteal vein and the) Caleb Hayes. On the left side there was a posterior tibial vein thrombus of indeterminate age.   We were consulted to further evaluate the cytopenias and rule out multiple myeloma. In addition there was a question of anticoagulation in this patient with thrombocytopenia. The patient's subsequet history is as detailed below  INTERVAL HISTORY: Caleb Hayes returns today for followup of his pernicious anemia, renal insufficiency, pancytopenia and history of deep vein thrombosis, accompanied by his sister. He continues on monthly B-12 injections and feels stronger than he did last year. 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 His gait is better, though he makes sure to still use a cane. The interval history is generally unremarkable.  REVIEW OF SYSTEMS: Caleb Hayes denies fevers, chills, nausea, or vomiting. He takes senna daily for constipation and flomax for his urinary function. He is eating and drinking well. His energy level is fair during the day and he sleeps at night. He has no excessive bruising, bleeding, headaches, dizziness, or weakness. He has some lower back pain. A detailed review of  systems is otherwise stable.   PAST MEDICAL HISTORY: Past Medical History  Diagnosis Date  . Hypertension   . Gout     PAST SURGICAL HISTORY: No past surgical history on file.  FAMILY HISTORY No family history on file.  SOCIAL HISTORY:  The patient's daughter Caleb Hayes lives with him. She has a 3d shift job. The patient's sister lives nearby and "can come in" as needed.    ADVANCED DIRECTIVES: not in place   HEALTH MAINTENANCE: History  Substance Use Topics  . Smoking status: Former Research scientist (life sciences)  . Smokeless tobacco: Not on file  . Alcohol Use: Yes     Comment: "every once in a while, not too regular"    No Known Allergies  Current Outpatient Prescriptions  Medication Sig Dispense Refill  . amLODipine (NORVASC) 5 MG tablet Take 5 mg by mouth daily.    . colchicine 0.6 MG tablet TAKE 1/2 TABLET DAILY 30 tablet 4  . ferrous sulfate 325 (65 FE) MG tablet Take 1 tablet (325 mg total) by mouth 3 (three) times daily with meals. 90 tablet 3  . furosemide (LASIX) 40 MG tablet Take 40 mg by mouth daily.    . Multiple Vitamin (MULTIVITAMIN WITH MINERALS) TABS tablet Take 1 tablet by mouth daily.    . pantoprazole (PROTONIX) 40 MG tablet Take 1 tablet (40 mg total) by mouth daily at 12 noon. 30 tablet 1  . senna-docusate (SENOKOT-S) 8.6-50 MG per tablet Take 2 tablets by mouth 2 (two) times daily. 30 tablet 0  . sodium bicarbonate 650 MG tablet Take 1 tablet (650 mg total) by mouth 2 (two)  times daily. 60 tablet 1  . tamsulosin (FLOMAX) 0.4 MG CAPS capsule Take 1 capsule (0.4 mg total) by mouth daily after supper. 30 capsule 0   No current facility-administered medications for this visit.    OBJECTIVE: elderly African American man in no acute distress  Filed Vitals:   07/01/14 1035  BP: 163/64  Pulse: 68  Temp: 97.6 F (36.4 C)  Resp: 18     Body mass index is 24.09 kg/(m^2).    ECOG FS:1 - Symptomatic but completely ambulatory  Skin: warm, dry  HEENT: sclerae anicteric,  conjunctivae pink, oropharynx clear. No thrush or mucositis.  Lymph Nodes: No cervical or supraclavicular lymphadenopathy  Lungs: clear to auscultation bilaterally, no rales, wheezes, or rhonci  Heart: regular rate and rhythm  Abdomen: round, soft, non tender, positive bowel sounds  Musculoskeletal: No focal spinal tenderness, no peripheral edema  Neuro: non focal, well oriented, positive affect    LAB RESULTS:  CMP     Component Value Date/Time   NA 140 07/01/2014 1019   NA 137 05/21/2013 0520   K 4.1 07/01/2014 1019   K 3.9 05/21/2013 0520   CL 101 05/21/2013 0520   CO2 18* 07/01/2014 1019   CO2 22 05/21/2013 0520   GLUCOSE 93 07/01/2014 1019   GLUCOSE 119* 05/21/2013 0520   BUN 51.8* 07/01/2014 1019   BUN 59* 05/21/2013 0520   CREATININE 4.1* 07/01/2014 1019   CREATININE 2.95* 05/21/2013 0520   CALCIUM 9.3 07/01/2014 1019   CALCIUM 8.6 05/21/2013 0520   PROT 7.8 07/01/2014 1019   PROT 6.6 05/17/2013 1030   ALBUMIN 3.5 07/01/2014 1019   ALBUMIN 2.3* 05/21/2013 0520   AST 24 07/01/2014 1019   AST 25 05/17/2013 1030   ALT 13 07/01/2014 1019   ALT 21 05/17/2013 1030   ALKPHOS 78 07/01/2014 1019   ALKPHOS 54 05/17/2013 1030   BILITOT 0.50 07/01/2014 1019   BILITOT 1.3* 05/17/2013 1030   GFRNONAA 19* 05/21/2013 0520   GFRAA 21* 05/21/2013 0520    I No results found for: SPEP  Lab Results  Component Value Date   WBC 8.2 07/01/2014   NEUTROABS 4.3 07/01/2014   HGB 11.6* 07/01/2014   HCT 35.1* 07/01/2014   MCV 83.8 07/01/2014   PLT 261 07/01/2014    _0 @  No results found for: LABCA2  No components found for: LABCA125  No results for input(s): INR in the last 168 hours.  Urinalysis    Component Value Date/Time   COLORURINE YELLOW 05/16/2013 2040   APPEARANCEUR CLOUDY* 05/16/2013 2040   LABSPEC 1.016 05/16/2013 2040   PHURINE 5.0 05/16/2013 2040   GLUCOSEU NEGATIVE 05/16/2013 2040   HGBUR NEGATIVE 05/16/2013 2040   BILIRUBINUR NEGATIVE  05/16/2013 2040   KETONESUR NEGATIVE 05/16/2013 2040   PROTEINUR 30* 05/16/2013 2040   UROBILINOGEN 0.2 05/16/2013 2040   NITRITE NEGATIVE 05/16/2013 2040   LEUKOCYTESUR NEGATIVE 05/16/2013 2040    STUDIES: No results found.   ASSESSMENT: 81 y.o. year old [place] man   (1) pernicious anemia with intrinsic factor antibodies documented March 2015  (2) renal insufficiency with negative myeloma workup  (3) Right lower extremity DVT March 2015, on coumadin  PLAN: Caleb Hayes is doing well today. The labs were reviewed in detail and his hgb is up to 11.6, with an MCV of 83.8. His platelets are normal at 261.   I have tried to impress upon the importance of a PCP to Caleb Hayes. He understands that here were are only treating  him for his anemia and his INR monitoring. Similarly his renal doctor is concentrated on a narrow focus. At this time Caleb Hayes stays that he can not afford one, and that nothing much else is wrong with him ( to his knowledge).   Caleb Hayes will continue monthly labs and B-12 injections. The coumadin clinic will continue to monitor his INR. Daion will return in 1 year for labs and a follow up visit. He understands and agrees with this plan. He has been encouraged to call with any issues that might arise before his next visit here.  Caleb Panda, NP   07/01/2014 11:06 AM

## 2014-07-16 ENCOUNTER — Other Ambulatory Visit: Payer: Self-pay | Admitting: Oncology

## 2014-07-18 ENCOUNTER — Telehealth: Payer: Self-pay | Admitting: *Deleted

## 2014-07-18 NOTE — Telephone Encounter (Signed)
Refill request received from pharmacy for lasix.  Noted elevated creatinine level last week with advice per HB/NP to follow up with kidney MD.  Prior refill given as courtesy by Dr Jannifer Rodney.  This RN contacted Dr Mattingly's office per prior note from inpt status.  Informed pt is scheduled for follow up visit today with MD.  This RN faxed appointment note and lab result from last week with note stating request for lasix received and need for Dr Mercy Moore to address.

## 2014-08-05 ENCOUNTER — Ambulatory Visit (HOSPITAL_BASED_OUTPATIENT_CLINIC_OR_DEPARTMENT_OTHER): Payer: Medicare Other

## 2014-08-05 ENCOUNTER — Other Ambulatory Visit (HOSPITAL_BASED_OUTPATIENT_CLINIC_OR_DEPARTMENT_OTHER): Payer: Medicare Other

## 2014-08-05 ENCOUNTER — Emergency Department (HOSPITAL_COMMUNITY): Payer: Medicare Other

## 2014-08-05 ENCOUNTER — Other Ambulatory Visit: Payer: Self-pay | Admitting: Nephrology

## 2014-08-05 ENCOUNTER — Other Ambulatory Visit: Payer: Medicare Other

## 2014-08-05 ENCOUNTER — Encounter (HOSPITAL_COMMUNITY): Payer: Self-pay | Admitting: Emergency Medicine

## 2014-08-05 ENCOUNTER — Emergency Department (HOSPITAL_COMMUNITY)
Admission: EM | Admit: 2014-08-05 | Discharge: 2014-08-05 | Disposition: A | Discharge status: Home / Self Care | Payer: Medicare Other | Attending: Emergency Medicine | Admitting: Emergency Medicine

## 2014-08-05 VITALS — BP 128/90 | HR 78 | Temp 97.7°F

## 2014-08-05 DIAGNOSIS — Z79899 Other long term (current) drug therapy: Secondary | ICD-10-CM | POA: Insufficient documentation

## 2014-08-05 DIAGNOSIS — Z87891 Personal history of nicotine dependence: Secondary | ICD-10-CM | POA: Diagnosis not present

## 2014-08-05 DIAGNOSIS — D510 Vitamin B12 deficiency anemia due to intrinsic factor deficiency: Secondary | ICD-10-CM | POA: Diagnosis not present

## 2014-08-05 DIAGNOSIS — R339 Retention of urine, unspecified: Secondary | ICD-10-CM | POA: Insufficient documentation

## 2014-08-05 DIAGNOSIS — I10 Essential (primary) hypertension: Secondary | ICD-10-CM | POA: Insufficient documentation

## 2014-08-05 DIAGNOSIS — R7989 Other specified abnormal findings of blood chemistry: Secondary | ICD-10-CM

## 2014-08-05 DIAGNOSIS — D519 Vitamin B12 deficiency anemia, unspecified: Secondary | ICD-10-CM

## 2014-08-05 DIAGNOSIS — N399 Disorder of urinary system, unspecified: Secondary | ICD-10-CM | POA: Diagnosis present

## 2014-08-05 DIAGNOSIS — M109 Gout, unspecified: Secondary | ICD-10-CM | POA: Insufficient documentation

## 2014-08-05 DIAGNOSIS — D509 Iron deficiency anemia, unspecified: Secondary | ICD-10-CM

## 2014-08-05 LAB — URINALYSIS, ROUTINE W REFLEX MICROSCOPIC
Bilirubin Urine: NEGATIVE
Glucose, UA: NEGATIVE mg/dL
KETONES UR: NEGATIVE mg/dL
NITRITE: NEGATIVE
Protein, ur: 30 mg/dL — AB
Specific Gravity, Urine: 1.01 (ref 1.005–1.030)
UROBILINOGEN UA: 0.2 mg/dL (ref 0.0–1.0)
pH: 6 (ref 5.0–8.0)

## 2014-08-05 LAB — COMPREHENSIVE METABOLIC PANEL (CC13)
ALK PHOS: 90 U/L (ref 40–150)
ALT: 19 U/L (ref 0–55)
AST: 17 U/L (ref 5–34)
Albumin: 2.9 g/dL — ABNORMAL LOW (ref 3.5–5.0)
Anion Gap: 10 mEq/L (ref 3–11)
BUN: 64.3 mg/dL — AB (ref 7.0–26.0)
CO2: 22 mEq/L (ref 22–29)
Calcium: 9.3 mg/dL (ref 8.4–10.4)
Chloride: 107 mEq/L (ref 98–109)
Creatinine: 5.6 mg/dL (ref 0.7–1.3)
EGFR: 10 mL/min/{1.73_m2} — ABNORMAL LOW (ref >90)
Glucose: 94 mg/dl (ref 70–140)
Potassium: 5.3 mEq/L — ABNORMAL HIGH (ref 3.5–5.1)
Sodium: 139 mEq/L (ref 136–145)
Total Bilirubin: 0.49 mg/dL (ref 0.20–1.20)
Total Protein: 8.1 g/dL (ref 6.4–8.3)

## 2014-08-05 LAB — CBC WITH DIFFERENTIAL/PLATELET
BASO%: 1.5 % (ref 0.0–2.0)
Basophils Absolute: 0.2 10*3/uL — ABNORMAL HIGH (ref 0.0–0.1)
EOS%: 1.2 % (ref 0.0–7.0)
Eosinophils Absolute: 0.1 10*3/uL (ref 0.0–0.5)
HCT: 32 % — ABNORMAL LOW (ref 38.4–49.9)
HGB: 10.6 g/dL — ABNORMAL LOW (ref 13.0–17.1)
LYMPH%: 23.2 % (ref 14.0–49.0)
MCH: 27.8 pg (ref 27.2–33.4)
MCHC: 33.1 g/dL (ref 32.0–36.0)
MCV: 84 fL (ref 79.3–98.0)
MONO#: 0.6 10*3/uL (ref 0.1–0.9)
MONO%: 5.2 % (ref 0.0–14.0)
NEUT%: 68.9 % (ref 39.0–75.0)
NEUTROS ABS: 7.8 10*3/uL — AB (ref 1.5–6.5)
Platelets: 402 10*3/uL — ABNORMAL HIGH (ref 140–400)
RBC: 3.81 10*6/uL — AB (ref 4.20–5.82)
RDW: 14.1 % (ref 11.0–14.6)
WBC: 11.4 10*3/uL — AB (ref 4.0–10.3)
lymph#: 2.6 10*3/uL (ref 0.9–3.3)

## 2014-08-05 LAB — URINE MICROSCOPIC-ADD ON

## 2014-08-05 MED ORDER — CEPHALEXIN 500 MG PO CAPS
500.0000 mg | ORAL_CAPSULE | Freq: Once | ORAL | Status: AC
  Administered 2014-08-05: 500 mg via ORAL
  Filled 2014-08-05: qty 1

## 2014-08-05 MED ORDER — CYANOCOBALAMIN 1000 MCG/ML IJ SOLN
1000.0000 ug | Freq: Once | INTRAMUSCULAR | Status: AC
  Administered 2014-08-05: 1000 ug via INTRAMUSCULAR

## 2014-08-05 MED ORDER — CEPHALEXIN 500 MG PO CAPS: 500.0000 mg | ORAL_CAPSULE | Freq: Four times a day (QID) | ORAL | Status: DC

## 2014-08-05 NOTE — ED Notes (Signed)
Pt sent over here from imaging for bil hydronephrosis.  Urology to be consulted when pt arrives.

## 2014-08-05 NOTE — Discharge Instructions (Signed)
Foley Catheter Care Take the antibiotic (Keflex) as prescribed. Contact Dr.MacDiarmid's office in 4 days to schedule appointment 10 days from now (August 14, 2104). A Foley catheter is a soft, flexible tube. This tube is placed into your bladder to drain pee (urine). If you go home with this catheter in place, follow the instructions below. TAKING CARE OF THE CATHETER 1. Wash your hands with soap and water. 2. Put soap and water on a clean washcloth.  Clean the skin where the tube goes into your body.  Clean away from the tube site.  Never wipe toward the tube.  Clean the area using a circular motion.  Remove all the soap. Pat the area dry with a clean towel. For males, reposition the skin that covers the end of the penis (foreskin). 3. Attach the tube to your leg with tape or a leg strap. Do not stretch the tube tight. If you are using tape, remove any stickiness left behind by past tape you used. 4. Keep the drainage bag below your hips. Keep it off the floor. 5. Check your tube during the day. Make sure it is working and draining. Make sure the tube does not curl, twist, or bend. 6. Do not pull on the tube or try to take it out. TAKING CARE OF THE DRAINAGE BAGS You will have a large overnight drainage bag and a small leg bag. You may wear the overnight bag any time. Never wear the small bag at night. Follow the directions below. Emptying the Drainage Bag Empty your drainage bag when it is  - full or at least 2-3 times a day. 1. Wash your hands with soap and water. 2. Keep the drainage bag below your hips. 3. Hold the dirty bag over the toilet or clean container. 4. Open the pour spout at the bottom of the bag. Empty the pee into the toilet or container. Do not let the pour spout touch anything. 5. Clean the pour spout with a gauze pad or cotton ball that has rubbing alcohol on it. 6. Close the pour spout. 7. Attach the bag to your leg with tape or a leg strap. 8. Wash your hands  well. Changing the Drainage Bag Change your bag once a month or sooner if it starts to smell or look dirty.  1. Wash your hands with soap and water. 2. Pinch the rubber tube so that pee does not spill out. 3. Disconnect the catheter tube from the drainage tube at the connection valve. Do not let the tubes touch anything. 4. Clean the end of the catheter tube with an alcohol wipe. Clean the end of a the drainage tube with a different alcohol wipe. 5. Connect the catheter tube to the drainage tube of the clean drainage bag. 6. Attach the new bag to the leg with tape or a leg strap. Avoid attaching the new bag too tightly. 7. Wash your hands well. Cleaning the Drainage Bag 1. Wash your hands with soap and water. 2. Wash the bag in warm, soapy water. 3. Rinse the bag with warm water. 4. Fill the bag with a mixture of white vinegar and water (1 cup vinegar to 1 quart warm water [.2 liter vinegar to 1 liter warm water]). Close the bag and soak it for 30 minutes in the solution. 5. Rinse the bag with warm water. 6. Hang the bag to dry with the pour spout open and hanging downward. 7. Store the clean bag (once it is dry) in  a clean plastic bag. 8. Wash your hands well. PREVENT INFECTION  Wash your hands before and after touching your tube.  Take showers every day. Wash the skin where the tube enters your body. Do not take baths. Replace wet leg straps with dry ones, if this applies.  Do not use powders, sprays, or lotions on the genital area. Only use creams, lotions, or ointments as told by your doctor.  For females, wipe from front to back after going to the bathroom.  Drink enough fluids to keep your pee clear or pale yellow unless you are told not to have too much fluid (fluid restriction).  Do not let the drainage bag or tubing touch or lie on the floor.  Wear cotton underwear to keep the area dry. GET HELP IF:  Your pee is cloudy or smells unusually bad.  Your tube becomes  clogged.  You are not draining pee into the bag or your bladder feels full.  Your tube starts to leak. GET HELP RIGHT AWAY IF:  You have pain, puffiness (swelling), redness, or yellowish-white fluid (pus) where the tube enters the body.  You have pain in the belly (abdomen), legs, lower back, or bladder.  You have a fever.  You see blood fill the tube, or your pee is pink or red.  You feel sick to your stomach (nauseous), throw up (vomit), or have chills.  Your tube gets pulled out. MAKE SURE YOU:   Understand these instructions.  Will watch your condition.  Will get help right away if you are not doing well or get worse. Document Released: 06/22/2012 Document Revised: 07/12/2013 Document Reviewed: 06/22/2012 [facility] Patient Information 2015 [place], [place]. This information is not intended to replace advice given to you by your health care provider. Make sure you discuss any questions you have with your health care provider.

## 2014-08-05 NOTE — ED Notes (Signed)
PVR >750 cc

## 2014-08-05 NOTE — ED Provider Notes (Addendum)
CSN: [id number]     Arrival date & time 08/05/14  1328 History   First MD Initiated Contact with Patient 08/05/14 1404     Chief Complaint  Patient presents with  . urinary problem      (Consider location/radiation/quality/duration/timing/severity/associated sxs/prior Treatment) HPI patient noted to have worsening renal insufficiency today noted on blood draw, with creatinine increasing to 5.6 from 4.1 one month ago. He was subsequently sent for renal ultrasound at [facility]. Report reads mild to moderate right and mild left hydronephrosis, layering debris within bladder human and large postvoid residual. He was sent here for further evaluation. Patient asymptomatic no other treatment prior to coming here  Past Medical History  Diagnosis Date  . Hypertension   . Gout    History reviewed. No pertinent past surgical history. No family history on file. History  Substance Use Topics  . Smoking status: Former Research scientist (life sciences)  . Smokeless tobacco: Not on file  . Alcohol Use: Yes     Comment: "every once in a while, not too regular"    Review of Systems  Constitutional: Negative.   HENT: Negative.   Respiratory: Negative.   Cardiovascular: Negative.   Gastrointestinal: Negative.   Musculoskeletal: Negative.   Skin: Negative.   Neurological: Negative.   Psychiatric/Behavioral: Negative.       Allergies  Review of patient's allergies indicates no known allergies.  Home Medications   Prior to Admission medications   Medication Sig Start Date End Date Taking? Authorizing Provider  amLODipine (NORVASC) 5 MG tablet Take 5 mg by mouth daily.   Yes Historical Provider, MD  colchicine 0.6 MG tablet TAKE 1/2 TABLET DAILY 02/21/14  Yes Chauncey Cruel, MD  ferrous sulfate 325 (65 FE) MG tablet Take 1 tablet (325 mg total) by mouth 3 (three) times daily with meals. 05/21/13  Yes Barton Dubois, MD  furosemide (LASIX) 40 MG tablet Take 40 mg by mouth daily.   Yes Historical Provider, MD   Multiple Vitamin (MULTIVITAMIN WITH MINERALS) TABS tablet Take 1 tablet by mouth daily. 05/21/13  Yes Barton Dubois, MD  pantoprazole (PROTONIX) 40 MG tablet Take 1 tablet (40 mg total) by mouth daily at 12 noon. 05/21/13  Yes Barton Dubois, MD  senna-docusate (SENOKOT-S) 8.6-50 MG per tablet Take 2 tablets by mouth 2 (two) times daily. 05/21/13  Yes Barton Dubois, MD  sodium bicarbonate 650 MG tablet Take 1 tablet (650 mg total) by mouth 2 (two) times daily. 05/21/13  Yes Barton Dubois, MD  tamsulosin (FLOMAX) 0.4 MG CAPS capsule Take 1 capsule (0.4 mg total) by mouth daily after supper. 08/06/13  Yes Virgie Dad Magrinat, MD   BP 176/76 mmHg  Pulse 81  Temp(Src) 97.8 F (36.6 C) (Oral)  Resp 17  SpO2 100% Physical Exam  Constitutional: He appears well-developed and well-nourished.  HENT:  Head: Normocephalic and atraumatic.  Eyes: Conjunctivae are normal. Pupils are equal, round, and reactive to light.  Neck: Neck supple. No tracheal deviation present. No thyromegaly present.  Cardiovascular: Normal rate and regular rhythm.   No murmur heard. Pulmonary/Chest: Effort normal and breath sounds normal.  Abdominal: Soft. Bowel sounds are normal. He exhibits no distension. There is no tenderness.  Genitourinary: Penis normal.  Musculoskeletal: Normal range of motion. He exhibits no edema or tenderness.  Neurological: He is alert. Coordination normal.  Skin: Skin is warm and dry. No rash noted.  Psychiatric: He has a normal mood and affect.  Nursing note and vitals reviewed.   ED Course  Procedures (including critical care time) Labs Review Labs Reviewed - No data to display  Imaging Review No results found.   EKG Interpretation None     Postvoid residual on bladder scan showed greater than 700 mL. Foley catheter inserted by nurse. Approximate 700 mL of urine came out of Foley catheter. Results for orders placed or performed during the hospital encounter of 08/05/14  Urinalysis,  Routine w reflex microscopic (not at [facility])  Result Value Ref Range   Color, Urine YELLOW YELLOW   APPearance TURBID (A) CLEAR   Specific Gravity, Urine 1.010 1.005 - 1.030   pH 6.0 5.0 - 8.0   Glucose, UA NEGATIVE NEGATIVE mg/dL   Hgb urine dipstick SMALL (A) NEGATIVE   Bilirubin Urine NEGATIVE NEGATIVE   Ketones, ur NEGATIVE NEGATIVE mg/dL   Protein, ur 30 (A) NEGATIVE mg/dL   Urobilinogen, UA 0.2 0.0 - 1.0 mg/dL   Nitrite NEGATIVE NEGATIVE   Leukocytes, UA LARGE (A) NEGATIVE  Urine microscopic-add on  Result Value Ref Range   Squamous Epithelial / LPF FEW (A) RARE   WBC, UA TOO NUMEROUS TO COUNT <3 WBC/hpf   RBC / HPF 3-6 <3 RBC/hpf   Bacteria, UA MANY (A) RARE   No results found.  MDM  Spoke with Dr. Louis Meckel plan urine sent for culture. Prescription Keflex. Patient go home with Foley catheter with leg bag. To call Dr. Matilde Sprang to be seen in office in 10 days. Worsening renal insufficiency felt likely secondary to post obstructive uropathy and urinary retention Diagnosis #1 urinary retention Diagnoses #2 renal insufficiency #3 urinary tract infection Final diagnoses:  None        Orlie Dakin, MD 08/05/14 1553  Orlie Dakin, MD 08/05/14 1556

## 2014-08-07 LAB — URINE CULTURE: Colony Count: 80000

## 2014-08-08 ENCOUNTER — Telehealth (HOSPITAL_COMMUNITY): Payer: Self-pay

## 2014-08-08 NOTE — Telephone Encounter (Signed)
Post ED Visit - Positive Culture Follow-up  Culture report reviewed by antimicrobial stewardship pharmacist: []  Wes [place], Pharm.D., BCPS []  Heide Guile, Pharm.D., BCPS []  Alycia Rossetti, Pharm.D., BCPS []  [place], Pharm.D., BCPS, AAHIVP []  Legrand Como, Pharm.D., BCPS, AAHIVP []  Isac Sarna, Pharm.D., BCPS X Rachel Rumbarger Pharm D  Positive urine culture Treated with cephalexin, organism sensitive to the same and no further patient follow-up is required at this time.  Ileene Musa 08/08/2014, 3:36 PM

## 2014-08-11 ENCOUNTER — Other Ambulatory Visit: Payer: Medicare Other

## 2014-09-02 ENCOUNTER — Ambulatory Visit: Payer: Medicare Other

## 2014-09-02 ENCOUNTER — Other Ambulatory Visit: Payer: Medicare Other

## 2014-09-05 ENCOUNTER — Other Ambulatory Visit (HOSPITAL_BASED_OUTPATIENT_CLINIC_OR_DEPARTMENT_OTHER): Payer: Medicare Other

## 2014-09-05 ENCOUNTER — Ambulatory Visit (HOSPITAL_BASED_OUTPATIENT_CLINIC_OR_DEPARTMENT_OTHER): Payer: Medicare Other

## 2014-09-05 VITALS — BP 140/58 | HR 66 | Temp 97.7°F | Resp 20

## 2014-09-05 DIAGNOSIS — D510 Vitamin B12 deficiency anemia due to intrinsic factor deficiency: Secondary | ICD-10-CM

## 2014-09-05 DIAGNOSIS — D519 Vitamin B12 deficiency anemia, unspecified: Secondary | ICD-10-CM

## 2014-09-05 LAB — CBC WITH DIFFERENTIAL/PLATELET
BASO%: 1.4 % (ref 0.0–2.0)
Basophils Absolute: 0.1 10*3/uL (ref 0.0–0.1)
EOS%: 4.7 % (ref 0.0–7.0)
Eosinophils Absolute: 0.4 10*3/uL (ref 0.0–0.5)
HCT: 31.3 % — ABNORMAL LOW (ref 38.4–49.9)
HGB: 10.6 g/dL — ABNORMAL LOW (ref 13.0–17.1)
LYMPH#: 2.4 10*3/uL (ref 0.9–3.3)
LYMPH%: 29.8 % (ref 14.0–49.0)
MCH: 27.9 pg (ref 27.2–33.4)
MCHC: 33.8 g/dL (ref 32.0–36.0)
MCV: 82.6 fL (ref 79.3–98.0)
MONO#: 0.4 10*3/uL (ref 0.1–0.9)
MONO%: 5.1 % (ref 0.0–14.0)
NEUT#: 4.8 10*3/uL (ref 1.5–6.5)
NEUT%: 59 % (ref 39.0–75.0)
Platelets: 324 10*3/uL (ref 140–400)
RBC: 3.78 10*6/uL — AB (ref 4.20–5.82)
RDW: 14.6 % (ref 11.0–14.6)
WBC: 8.2 10*3/uL (ref 4.0–10.3)

## 2014-09-05 MED ORDER — CYANOCOBALAMIN 1000 MCG/ML IJ SOLN
1000.0000 ug | Freq: Once | INTRAMUSCULAR | Status: AC
  Administered 2014-09-05: 1000 ug via INTRAMUSCULAR

## 2014-09-05 NOTE — Patient Instructions (Signed)
Darbepoetin Alfa injectionCyanocobalamin, Vitamin B12 injection What is this medicine? CYANOCOBALAMIN (sye an oh koe BAL a min) is a man made form of vitamin B12. Vitamin B12 is used in the growth of healthy blood cells, nerve cells, and proteins in the body. It also helps with the metabolism of fats and carbohydrates. This medicine is used to treat people who can not absorb vitamin B12. This medicine may be used for other purposes; ask your health care provider or pharmacist if you have questions. COMMON BRAND NAME(S): Cyomin, LA-12, Nutri-Twelve, Primabalt What should I tell my health care provider before I take this medicine? They need to know if you have any of these conditions: -kidney disease -Leber's disease -megaloblastic anemia -an unusual or allergic reaction to cyanocobalamin, cobalt, other medicines, foods, dyes, or preservatives -pregnant or trying to get pregnant -breast-feeding How should I use this medicine? This medicine is injected into a muscle or deeply under the skin. It is usually given by a health care professional in a clinic or doctor's office. However, your doctor may teach you how to inject yourself. Follow all instructions. Talk to your pediatrician regarding the use of this medicine in children. Special care may be needed. Overdosage: If you think you have taken too much of this medicine contact a poison control center or emergency room at once. NOTE: This medicine is only for you. Do not share this medicine with others. What if I miss a dose? If you are given your dose at a clinic or doctor's office, call to reschedule your appointment. If you give your own injections and you miss a dose, take it as soon as you can. If it is almost time for your next dose, take only that dose. Do not take double or extra doses. What may interact with this medicine? -colchicine -heavy alcohol intake This list may not describe all possible interactions. Give your health care  provider a list of all the medicines, herbs, non-prescription drugs, or dietary supplements you use. Also tell them if you smoke, drink alcohol, or use illegal drugs. Some items may interact with your medicine. What should I watch for while using this medicine? Visit your doctor or health care professional regularly. You may need blood work done while you are taking this medicine. You may need to follow a special diet. Talk to your doctor. Limit your alcohol intake and avoid smoking to get the best benefit. What side effects may I notice from receiving this medicine? Side effects that you should report to your doctor or health care professional as soon as possible: -allergic reactions like skin rash, itching or hives, swelling of the face, lips, or tongue -blue tint to skin -chest tightness, pain -difficulty breathing, wheezing -dizziness -red, swollen painful area on the leg Side effects that usually do not require medical attention (report to your doctor or health care professional if they continue or are bothersome): -diarrhea -headache This list may not describe all possible side effects. Call your doctor for medical advice about side effects. You may report side effects to FDA at 1-800-FDA-1088. Where should I keep my medicine? Keep out of the reach of children. Store at room temperature between 15 and 30 degrees C (59 and 85 degrees F). Protect from light. Throw away any unused medicine after the expiration date. NOTE: This sheet is a summary. It may not cover all possible information. If you have questions about this medicine, talk to your doctor, pharmacist, or health care provider.  2015, Elsevier/Gold Standard. (2007-06-08  22:10:20)  

## 2014-09-30 ENCOUNTER — Other Ambulatory Visit: Payer: Medicare Other

## 2014-09-30 ENCOUNTER — Ambulatory Visit (HOSPITAL_BASED_OUTPATIENT_CLINIC_OR_DEPARTMENT_OTHER): Payer: Medicare Other

## 2014-09-30 ENCOUNTER — Other Ambulatory Visit (HOSPITAL_BASED_OUTPATIENT_CLINIC_OR_DEPARTMENT_OTHER): Payer: Medicare Other

## 2014-09-30 ENCOUNTER — Ambulatory Visit: Payer: Medicare Other

## 2014-09-30 VITALS — BP 124/60 | HR 76 | Temp 97.9°F

## 2014-09-30 DIAGNOSIS — D509 Iron deficiency anemia, unspecified: Secondary | ICD-10-CM

## 2014-09-30 DIAGNOSIS — D510 Vitamin B12 deficiency anemia due to intrinsic factor deficiency: Secondary | ICD-10-CM | POA: Diagnosis not present

## 2014-09-30 DIAGNOSIS — D519 Vitamin B12 deficiency anemia, unspecified: Secondary | ICD-10-CM

## 2014-09-30 LAB — CBC WITH DIFFERENTIAL/PLATELET
BASO%: 0.5 % (ref 0.0–2.0)
BASOS ABS: 0 10*3/uL (ref 0.0–0.1)
EOS ABS: 0.3 10*3/uL (ref 0.0–0.5)
EOS%: 4.3 % (ref 0.0–7.0)
HCT: 28.3 % — ABNORMAL LOW (ref 38.4–49.9)
HEMOGLOBIN: 9.5 g/dL — AB (ref 13.0–17.1)
LYMPH%: 35 % (ref 14.0–49.0)
MCH: 27.7 pg (ref 27.2–33.4)
MCHC: 33.6 g/dL (ref 32.0–36.0)
MCV: 82.5 fL (ref 79.3–98.0)
MONO#: 0.4 10*3/uL (ref 0.1–0.9)
MONO%: 5.6 % (ref 0.0–14.0)
NEUT#: 4.1 10*3/uL (ref 1.5–6.5)
NEUT%: 54.6 % (ref 39.0–75.0)
PLATELETS: 271 10*3/uL (ref 140–400)
RBC: 3.43 10*6/uL — ABNORMAL LOW (ref 4.20–5.82)
RDW: 14.8 % — ABNORMAL HIGH (ref 11.0–14.6)
WBC: 7.5 10*3/uL (ref 4.0–10.3)
lymph#: 2.6 10*3/uL (ref 0.9–3.3)

## 2014-09-30 MED ORDER — CYANOCOBALAMIN 1000 MCG/ML IJ SOLN
1000.0000 ug | Freq: Once | INTRAMUSCULAR | Status: AC
  Administered 2014-09-30: 1000 ug via INTRAMUSCULAR

## 2014-11-04 ENCOUNTER — Ambulatory Visit (HOSPITAL_BASED_OUTPATIENT_CLINIC_OR_DEPARTMENT_OTHER): Payer: Medicare Other

## 2014-11-04 ENCOUNTER — Other Ambulatory Visit: Payer: Medicare Other

## 2014-11-04 ENCOUNTER — Ambulatory Visit: Payer: Medicare Other

## 2014-11-04 ENCOUNTER — Other Ambulatory Visit (HOSPITAL_BASED_OUTPATIENT_CLINIC_OR_DEPARTMENT_OTHER): Payer: Medicare Other

## 2014-11-04 VITALS — BP 129/65 | HR 68 | Temp 97.6°F

## 2014-11-04 DIAGNOSIS — D510 Vitamin B12 deficiency anemia due to intrinsic factor deficiency: Secondary | ICD-10-CM

## 2014-11-04 DIAGNOSIS — D519 Vitamin B12 deficiency anemia, unspecified: Secondary | ICD-10-CM

## 2014-11-04 DIAGNOSIS — D509 Iron deficiency anemia, unspecified: Secondary | ICD-10-CM

## 2014-11-04 LAB — CBC WITH DIFFERENTIAL/PLATELET
BASO%: 1.8 % (ref 0.0–2.0)
Basophils Absolute: 0.1 10*3/uL (ref 0.0–0.1)
EOS ABS: 0.6 10*3/uL — AB (ref 0.0–0.5)
EOS%: 8.3 % — ABNORMAL HIGH (ref 0.0–7.0)
HEMATOCRIT: 29.9 % — AB (ref 38.4–49.9)
HEMOGLOBIN: 10 g/dL — AB (ref 13.0–17.1)
LYMPH#: 2.3 10*3/uL (ref 0.9–3.3)
LYMPH%: 32.7 % (ref 14.0–49.0)
MCH: 27.7 pg (ref 27.2–33.4)
MCHC: 33.5 g/dL (ref 32.0–36.0)
MCV: 82.9 fL (ref 79.3–98.0)
MONO#: 0.5 10*3/uL (ref 0.1–0.9)
MONO%: 6.9 % (ref 0.0–14.0)
NEUT%: 50.3 % (ref 39.0–75.0)
NEUTROS ABS: 3.6 10*3/uL (ref 1.5–6.5)
Platelets: 262 10*3/uL (ref 140–400)
RBC: 3.61 10*6/uL — ABNORMAL LOW (ref 4.20–5.82)
RDW: 15.8 % — AB (ref 11.0–14.6)
WBC: 7.1 10*3/uL (ref 4.0–10.3)

## 2014-11-04 MED ORDER — CYANOCOBALAMIN 1000 MCG/ML IJ SOLN
1000.0000 ug | Freq: Once | INTRAMUSCULAR | Status: AC
  Administered 2014-11-04: 1000 ug via INTRAMUSCULAR

## 2014-12-02 ENCOUNTER — Other Ambulatory Visit: Payer: Medicare Other

## 2014-12-02 ENCOUNTER — Ambulatory Visit: Payer: Medicare Other

## 2014-12-06 ENCOUNTER — Other Ambulatory Visit: Payer: Self-pay | Admitting: Oncology

## 2014-12-30 ENCOUNTER — Ambulatory Visit: Payer: Medicare Other

## 2014-12-30 ENCOUNTER — Other Ambulatory Visit (HOSPITAL_BASED_OUTPATIENT_CLINIC_OR_DEPARTMENT_OTHER): Payer: Medicare Other

## 2014-12-30 ENCOUNTER — Ambulatory Visit (HOSPITAL_BASED_OUTPATIENT_CLINIC_OR_DEPARTMENT_OTHER): Payer: Medicare Other

## 2014-12-30 ENCOUNTER — Other Ambulatory Visit: Payer: Medicare Other

## 2014-12-30 VITALS — BP 129/68 | HR 73 | Temp 97.8°F | Resp 20

## 2014-12-30 DIAGNOSIS — D510 Vitamin B12 deficiency anemia due to intrinsic factor deficiency: Secondary | ICD-10-CM

## 2014-12-30 DIAGNOSIS — D519 Vitamin B12 deficiency anemia, unspecified: Secondary | ICD-10-CM

## 2014-12-30 LAB — CBC WITH DIFFERENTIAL/PLATELET
BASO%: 1.2 % (ref 0.0–2.0)
Basophils Absolute: 0.1 10*3/uL (ref 0.0–0.1)
EOS%: 8.7 % — AB (ref 0.0–7.0)
Eosinophils Absolute: 0.6 10*3/uL — ABNORMAL HIGH (ref 0.0–0.5)
HCT: 30.3 % — ABNORMAL LOW (ref 38.4–49.9)
HEMOGLOBIN: 10.5 g/dL — AB (ref 13.0–17.1)
LYMPH#: 2.7 10*3/uL (ref 0.9–3.3)
LYMPH%: 42.2 % (ref 14.0–49.0)
MCH: 28 pg (ref 27.2–33.4)
MCHC: 34.7 g/dL (ref 32.0–36.0)
MCV: 80.8 fL (ref 79.3–98.0)
MONO#: 0.4 10*3/uL (ref 0.1–0.9)
MONO%: 6 % (ref 0.0–14.0)
NEUT#: 2.7 10*3/uL (ref 1.5–6.5)
NEUT%: 41.9 % (ref 39.0–75.0)
Platelets: 243 10*3/uL (ref 140–400)
RBC: 3.75 10*6/uL — AB (ref 4.20–5.82)
RDW: 15.3 % — ABNORMAL HIGH (ref 11.0–14.6)
WBC: 6.5 10*3/uL (ref 4.0–10.3)
nRBC: 0 % (ref 0–0)

## 2014-12-30 MED ORDER — CYANOCOBALAMIN 1000 MCG/ML IJ SOLN
1000.0000 ug | Freq: Once | INTRAMUSCULAR | Status: AC
  Administered 2014-12-30: 1000 ug via INTRAMUSCULAR

## 2014-12-30 NOTE — Patient Instructions (Signed)
Darbepoetin Alfa injectionCyanocobalamin, Vitamin B12 injection What is this medicine? CYANOCOBALAMIN (sye an oh koe BAL a min) is a man made form of vitamin B12. Vitamin B12 is used in the growth of healthy blood cells, nerve cells, and proteins in the body. It also helps with the metabolism of fats and carbohydrates. This medicine is used to treat people who can not absorb vitamin B12. This medicine may be used for other purposes; ask your health care provider or pharmacist if you have questions. COMMON BRAND NAME(S): Cyomin, LA-12, Nutri-Twelve, Primabalt What should I tell my health care provider before I take this medicine? They need to know if you have any of these conditions: -kidney disease -Leber's disease -megaloblastic anemia -an unusual or allergic reaction to cyanocobalamin, cobalt, other medicines, foods, dyes, or preservatives -pregnant or trying to get pregnant -breast-feeding How should I use this medicine? This medicine is injected into a muscle or deeply under the skin. It is usually given by a health care professional in a clinic or doctor's office. However, your doctor may teach you how to inject yourself. Follow all instructions. Talk to your pediatrician regarding the use of this medicine in children. Special care may be needed. Overdosage: If you think you have taken too much of this medicine contact a poison control center or emergency room at once. NOTE: This medicine is only for you. Do not share this medicine with others. What if I miss a dose? If you are given your dose at a clinic or doctor's office, call to reschedule your appointment. If you give your own injections and you miss a dose, take it as soon as you can. If it is almost time for your next dose, take only that dose. Do not take double or extra doses. What may interact with this medicine? -colchicine -heavy alcohol intake This list may not describe all possible interactions. Give your health care  provider a list of all the medicines, herbs, non-prescription drugs, or dietary supplements you use. Also tell them if you smoke, drink alcohol, or use illegal drugs. Some items may interact with your medicine. What should I watch for while using this medicine? Visit your doctor or health care professional regularly. You may need blood work done while you are taking this medicine. You may need to follow a special diet. Talk to your doctor. Limit your alcohol intake and avoid smoking to get the best benefit. What side effects may I notice from receiving this medicine? Side effects that you should report to your doctor or health care professional as soon as possible: -allergic reactions like skin rash, itching or hives, swelling of the face, lips, or tongue -blue tint to skin -chest tightness, pain -difficulty breathing, wheezing -dizziness -red, swollen painful area on the leg Side effects that usually do not require medical attention (report to your doctor or health care professional if they continue or are bothersome): -diarrhea -headache This list may not describe all possible side effects. Call your doctor for medical advice about side effects. You may report side effects to FDA at 1-800-FDA-1088. Where should I keep my medicine? Keep out of the reach of children. Store at room temperature between 15 and 30 degrees C (59 and 85 degrees F). Protect from light. Throw away any unused medicine after the expiration date. NOTE: This sheet is a summary. It may not cover all possible information. If you have questions about this medicine, talk to your doctor, pharmacist, or health care provider.  2015, Elsevier/Gold Standard. (2007-06-08  22:10:20)  

## 2015-02-03 ENCOUNTER — Ambulatory Visit: Payer: Medicare Other

## 2015-02-03 ENCOUNTER — Other Ambulatory Visit: Payer: Medicare Other

## 2015-02-03 ENCOUNTER — Ambulatory Visit (HOSPITAL_BASED_OUTPATIENT_CLINIC_OR_DEPARTMENT_OTHER): Payer: Medicare Other

## 2015-02-03 ENCOUNTER — Other Ambulatory Visit (HOSPITAL_BASED_OUTPATIENT_CLINIC_OR_DEPARTMENT_OTHER): Payer: Medicare Other

## 2015-02-03 VITALS — BP 125/67 | HR 42 | Temp 98.2°F

## 2015-02-03 DIAGNOSIS — D510 Vitamin B12 deficiency anemia due to intrinsic factor deficiency: Secondary | ICD-10-CM

## 2015-02-03 DIAGNOSIS — D519 Vitamin B12 deficiency anemia, unspecified: Secondary | ICD-10-CM

## 2015-02-03 LAB — CBC WITH DIFFERENTIAL/PLATELET
BASO%: 1 % (ref 0.0–2.0)
Basophils Absolute: 0.1 10*3/uL (ref 0.0–0.1)
EOS%: 10.1 % — ABNORMAL HIGH (ref 0.0–7.0)
Eosinophils Absolute: 0.7 10*3/uL — ABNORMAL HIGH (ref 0.0–0.5)
HCT: 32.1 % — ABNORMAL LOW (ref 38.4–49.9)
HGB: 11 g/dL — ABNORMAL LOW (ref 13.0–17.1)
LYMPH%: 36.6 % (ref 14.0–49.0)
MCH: 27.8 pg (ref 27.2–33.4)
MCHC: 34.3 g/dL (ref 32.0–36.0)
MCV: 81.1 fL (ref 79.3–98.0)
MONO#: 0.5 10*3/uL (ref 0.1–0.9)
MONO%: 6.6 % (ref 0.0–14.0)
NEUT#: 3.2 10*3/uL (ref 1.5–6.5)
NEUT%: 45.7 % (ref 39.0–75.0)
NRBC: 0 % (ref 0–0)
PLATELETS: 274 10*3/uL (ref 140–400)
RBC: 3.96 10*6/uL — AB (ref 4.20–5.82)
RDW: 14.6 % (ref 11.0–14.6)
WBC: 7 10*3/uL (ref 4.0–10.3)
lymph#: 2.6 10*3/uL (ref 0.9–3.3)

## 2015-02-03 MED ORDER — CYANOCOBALAMIN 1000 MCG/ML IJ SOLN
1000.0000 ug | Freq: Once | INTRAMUSCULAR | Status: AC
  Administered 2015-02-03: 1000 ug via INTRAMUSCULAR

## 2015-02-11 ENCOUNTER — Other Ambulatory Visit: Payer: Self-pay | Admitting: Oncology

## 2015-02-12 ENCOUNTER — Other Ambulatory Visit: Payer: Self-pay | Admitting: Oncology

## 2015-02-12 NOTE — Telephone Encounter (Signed)
This should be presecribed by his PCP

## 2015-03-03 ENCOUNTER — Ambulatory Visit: Payer: Medicare Other

## 2015-03-03 ENCOUNTER — Other Ambulatory Visit (HOSPITAL_BASED_OUTPATIENT_CLINIC_OR_DEPARTMENT_OTHER): Payer: Medicare Other

## 2015-03-03 ENCOUNTER — Other Ambulatory Visit: Payer: Medicare Other

## 2015-03-03 ENCOUNTER — Ambulatory Visit (HOSPITAL_BASED_OUTPATIENT_CLINIC_OR_DEPARTMENT_OTHER): Payer: Medicare Other

## 2015-03-03 VITALS — BP 127/65 | HR 70 | Temp 97.6°F

## 2015-03-03 DIAGNOSIS — D509 Iron deficiency anemia, unspecified: Secondary | ICD-10-CM

## 2015-03-03 DIAGNOSIS — D519 Vitamin B12 deficiency anemia, unspecified: Secondary | ICD-10-CM | POA: Diagnosis not present

## 2015-03-03 LAB — CBC WITH DIFFERENTIAL/PLATELET
BASO%: 0.6 % (ref 0.0–2.0)
BASOS ABS: 0 10*3/uL (ref 0.0–0.1)
EOS ABS: 0.5 10*3/uL (ref 0.0–0.5)
EOS%: 7 % (ref 0.0–7.0)
HCT: 32.7 % — ABNORMAL LOW (ref 38.4–49.9)
HEMOGLOBIN: 11.1 g/dL — AB (ref 13.0–17.1)
LYMPH%: 45.8 % (ref 14.0–49.0)
MCH: 27.9 pg (ref 27.2–33.4)
MCHC: 33.9 g/dL (ref 32.0–36.0)
MCV: 82.2 fL (ref 79.3–98.0)
MONO#: 0.4 10*3/uL (ref 0.1–0.9)
MONO%: 5.4 % (ref 0.0–14.0)
NEUT#: 2.8 10*3/uL (ref 1.5–6.5)
NEUT%: 41.2 % (ref 39.0–75.0)
PLATELETS: 257 10*3/uL (ref 140–400)
RBC: 3.98 10*6/uL — ABNORMAL LOW (ref 4.20–5.82)
RDW: 14.7 % — AB (ref 11.0–14.6)
WBC: 6.8 10*3/uL (ref 4.0–10.3)
lymph#: 3.1 10*3/uL (ref 0.9–3.3)

## 2015-03-03 MED ORDER — CYANOCOBALAMIN 1000 MCG/ML IJ SOLN
1000.0000 ug | Freq: Once | INTRAMUSCULAR | Status: AC
  Administered 2015-03-03: 1000 ug via INTRAMUSCULAR

## 2015-03-31 ENCOUNTER — Other Ambulatory Visit: Payer: Medicare Other

## 2015-03-31 ENCOUNTER — Ambulatory Visit: Payer: Medicare Other

## 2015-03-31 ENCOUNTER — Ambulatory Visit (HOSPITAL_BASED_OUTPATIENT_CLINIC_OR_DEPARTMENT_OTHER): Payer: Medicare Other

## 2015-03-31 ENCOUNTER — Other Ambulatory Visit (HOSPITAL_BASED_OUTPATIENT_CLINIC_OR_DEPARTMENT_OTHER): Payer: Medicare Other

## 2015-03-31 VITALS — BP 143/82 | HR 69 | Temp 98.2°F

## 2015-03-31 DIAGNOSIS — D510 Vitamin B12 deficiency anemia due to intrinsic factor deficiency: Secondary | ICD-10-CM

## 2015-03-31 DIAGNOSIS — D519 Vitamin B12 deficiency anemia, unspecified: Secondary | ICD-10-CM

## 2015-03-31 LAB — CBC WITH DIFFERENTIAL/PLATELET
BASO%: 1.1 % (ref 0.0–2.0)
Basophils Absolute: 0.1 10*3/uL (ref 0.0–0.1)
EOS%: 4.3 % (ref 0.0–7.0)
Eosinophils Absolute: 0.4 10*3/uL (ref 0.0–0.5)
HEMATOCRIT: 33.6 % — AB (ref 38.4–49.9)
HGB: 11.3 g/dL — ABNORMAL LOW (ref 13.0–17.1)
LYMPH%: 32.5 % (ref 14.0–49.0)
MCH: 28 pg (ref 27.2–33.4)
MCHC: 33.6 g/dL (ref 32.0–36.0)
MCV: 83.4 fL (ref 79.3–98.0)
MONO#: 0.5 10*3/uL (ref 0.1–0.9)
MONO%: 4.9 % (ref 0.0–14.0)
NEUT%: 57.2 % (ref 39.0–75.0)
NEUTROS ABS: 5.3 10*3/uL (ref 1.5–6.5)
Platelets: 309 10*3/uL (ref 140–400)
RBC: 4.03 10*6/uL — ABNORMAL LOW (ref 4.20–5.82)
RDW: 14.9 % — ABNORMAL HIGH (ref 11.0–14.6)
WBC: 9.2 10*3/uL (ref 4.0–10.3)
lymph#: 3 10*3/uL (ref 0.9–3.3)
nRBC: 0 % (ref 0–0)

## 2015-03-31 MED ORDER — CYANOCOBALAMIN 1000 MCG/ML IJ SOLN
1000.0000 ug | Freq: Once | INTRAMUSCULAR | Status: AC
  Administered 2015-03-31: 1000 ug via INTRAMUSCULAR

## 2015-04-18 ENCOUNTER — Other Ambulatory Visit: Payer: Self-pay | Admitting: Oncology

## 2015-04-28 ENCOUNTER — Other Ambulatory Visit: Payer: Medicare Other

## 2015-04-28 ENCOUNTER — Ambulatory Visit (HOSPITAL_BASED_OUTPATIENT_CLINIC_OR_DEPARTMENT_OTHER): Payer: Medicare Other

## 2015-04-28 ENCOUNTER — Other Ambulatory Visit (HOSPITAL_BASED_OUTPATIENT_CLINIC_OR_DEPARTMENT_OTHER): Payer: Medicare Other

## 2015-04-28 ENCOUNTER — Ambulatory Visit: Payer: Medicare Other

## 2015-04-28 VITALS — BP 97/56 | HR 84 | Temp 98.0°F

## 2015-04-28 DIAGNOSIS — D519 Vitamin B12 deficiency anemia, unspecified: Secondary | ICD-10-CM

## 2015-04-28 DIAGNOSIS — D510 Vitamin B12 deficiency anemia due to intrinsic factor deficiency: Secondary | ICD-10-CM

## 2015-04-28 LAB — CBC WITH DIFFERENTIAL/PLATELET
BASO%: 1.2 % (ref 0.0–2.0)
BASOS ABS: 0.1 10*3/uL (ref 0.0–0.1)
EOS%: 0.9 % (ref 0.0–7.0)
Eosinophils Absolute: 0.1 10*3/uL (ref 0.0–0.5)
HCT: 34.1 % — ABNORMAL LOW (ref 38.4–49.9)
HGB: 11.5 g/dL — ABNORMAL LOW (ref 13.0–17.1)
LYMPH%: 44.7 % (ref 14.0–49.0)
MCH: 27.9 pg (ref 27.2–33.4)
MCHC: 33.7 g/dL (ref 32.0–36.0)
MCV: 82.8 fL (ref 79.3–98.0)
MONO#: 0.5 10*3/uL (ref 0.1–0.9)
MONO%: 9.2 % (ref 0.0–14.0)
NEUT#: 2.5 10*3/uL (ref 1.5–6.5)
NEUT%: 44 % (ref 39.0–75.0)
PLATELETS: 204 10*3/uL (ref 140–400)
RBC: 4.12 10*6/uL — AB (ref 4.20–5.82)
RDW: 14.8 % — AB (ref 11.0–14.6)
WBC: 5.7 10*3/uL (ref 4.0–10.3)
lymph#: 2.6 10*3/uL (ref 0.9–3.3)
nRBC: 0 % (ref 0–0)

## 2015-04-28 LAB — TECHNOLOGIST REVIEW

## 2015-04-28 MED ORDER — CYANOCOBALAMIN 1000 MCG/ML IJ SOLN
1000.0000 ug | Freq: Once | INTRAMUSCULAR | Status: AC
  Administered 2015-04-28: 1000 ug via INTRAMUSCULAR

## 2015-05-07 ENCOUNTER — Other Ambulatory Visit: Payer: Self-pay | Admitting: Oncology

## 2015-05-23 DIAGNOSIS — N184 Chronic kidney disease, stage 4 (severe): Secondary | ICD-10-CM | POA: Diagnosis not present

## 2015-05-23 DIAGNOSIS — I129 Hypertensive chronic kidney disease with stage 1 through stage 4 chronic kidney disease, or unspecified chronic kidney disease: Secondary | ICD-10-CM | POA: Diagnosis not present

## 2015-05-23 DIAGNOSIS — N390 Urinary tract infection, site not specified: Secondary | ICD-10-CM | POA: Diagnosis not present

## 2015-05-23 DIAGNOSIS — E538 Deficiency of other specified B group vitamins: Secondary | ICD-10-CM | POA: Diagnosis not present

## 2015-05-23 DIAGNOSIS — N2581 Secondary hyperparathyroidism of renal origin: Secondary | ICD-10-CM | POA: Diagnosis not present

## 2015-05-26 ENCOUNTER — Other Ambulatory Visit: Payer: Medicare Other

## 2015-05-26 ENCOUNTER — Other Ambulatory Visit (HOSPITAL_BASED_OUTPATIENT_CLINIC_OR_DEPARTMENT_OTHER): Payer: Medicare Other

## 2015-05-26 ENCOUNTER — Ambulatory Visit (HOSPITAL_BASED_OUTPATIENT_CLINIC_OR_DEPARTMENT_OTHER): Payer: Medicare Other

## 2015-05-26 ENCOUNTER — Ambulatory Visit: Payer: Medicare Other

## 2015-05-26 VITALS — BP 114/64 | HR 89 | Temp 97.5°F

## 2015-05-26 DIAGNOSIS — D519 Vitamin B12 deficiency anemia, unspecified: Secondary | ICD-10-CM

## 2015-05-26 DIAGNOSIS — D510 Vitamin B12 deficiency anemia due to intrinsic factor deficiency: Secondary | ICD-10-CM | POA: Diagnosis not present

## 2015-05-26 LAB — CBC WITH DIFFERENTIAL/PLATELET
BASO%: 0.9 % (ref 0.0–2.0)
Basophils Absolute: 0.1 10*3/uL (ref 0.0–0.1)
EOS ABS: 0.7 10*3/uL — AB (ref 0.0–0.5)
EOS%: 6.8 % (ref 0.0–7.0)
HCT: 31.9 % — ABNORMAL LOW (ref 38.4–49.9)
HGB: 10.9 g/dL — ABNORMAL LOW (ref 13.0–17.1)
LYMPH%: 29.4 % (ref 14.0–49.0)
MCH: 28.4 pg (ref 27.2–33.4)
MCHC: 34.2 g/dL (ref 32.0–36.0)
MCV: 83.1 fL (ref 79.3–98.0)
MONO#: 0.6 10*3/uL (ref 0.1–0.9)
MONO%: 6.2 % (ref 0.0–14.0)
NEUT%: 56.7 % (ref 39.0–75.0)
NEUTROS ABS: 5.7 10*3/uL (ref 1.5–6.5)
NRBC: 0 % (ref 0–0)
Platelets: 262 10*3/uL (ref 140–400)
RBC: 3.84 10*6/uL — AB (ref 4.20–5.82)
RDW: 14.8 % — AB (ref 11.0–14.6)
WBC: 10 10*3/uL (ref 4.0–10.3)
lymph#: 2.9 10*3/uL (ref 0.9–3.3)

## 2015-05-26 MED ORDER — CYANOCOBALAMIN 1000 MCG/ML IJ SOLN
1000.0000 ug | Freq: Once | INTRAMUSCULAR | Status: AC
  Administered 2015-05-26: 1000 ug via INTRAMUSCULAR

## 2015-07-03 ENCOUNTER — Other Ambulatory Visit: Payer: Self-pay

## 2015-07-03 DIAGNOSIS — D649 Anemia, unspecified: Secondary | ICD-10-CM

## 2015-07-04 ENCOUNTER — Other Ambulatory Visit: Payer: Self-pay

## 2015-07-04 ENCOUNTER — Other Ambulatory Visit: Payer: Medicare Other

## 2015-07-04 ENCOUNTER — Other Ambulatory Visit (HOSPITAL_BASED_OUTPATIENT_CLINIC_OR_DEPARTMENT_OTHER): Payer: Medicare Other

## 2015-07-04 ENCOUNTER — Ambulatory Visit (HOSPITAL_BASED_OUTPATIENT_CLINIC_OR_DEPARTMENT_OTHER): Payer: Medicare Other | Admitting: Oncology

## 2015-07-04 ENCOUNTER — Ambulatory Visit: Payer: Medicare Other

## 2015-07-04 ENCOUNTER — Ambulatory Visit (HOSPITAL_BASED_OUTPATIENT_CLINIC_OR_DEPARTMENT_OTHER): Payer: Medicare Other

## 2015-07-04 ENCOUNTER — Telehealth: Payer: Self-pay | Admitting: Oncology

## 2015-07-04 ENCOUNTER — Ambulatory Visit: Payer: Medicare Other | Admitting: Oncology

## 2015-07-04 VITALS — BP 195/65 | HR 62 | Temp 97.7°F | Resp 18 | Ht 69.0 in | Wt 148.5 lb

## 2015-07-04 DIAGNOSIS — D510 Vitamin B12 deficiency anemia due to intrinsic factor deficiency: Secondary | ICD-10-CM | POA: Diagnosis not present

## 2015-07-04 DIAGNOSIS — I82401 Acute embolism and thrombosis of unspecified deep veins of right lower extremity: Secondary | ICD-10-CM | POA: Diagnosis not present

## 2015-07-04 DIAGNOSIS — I10 Essential (primary) hypertension: Secondary | ICD-10-CM

## 2015-07-04 DIAGNOSIS — D649 Anemia, unspecified: Secondary | ICD-10-CM | POA: Diagnosis not present

## 2015-07-04 DIAGNOSIS — N19 Unspecified kidney failure: Secondary | ICD-10-CM | POA: Diagnosis not present

## 2015-07-04 DIAGNOSIS — E785 Hyperlipidemia, unspecified: Secondary | ICD-10-CM | POA: Diagnosis not present

## 2015-07-04 DIAGNOSIS — N189 Chronic kidney disease, unspecified: Secondary | ICD-10-CM

## 2015-07-04 DIAGNOSIS — N179 Acute kidney failure, unspecified: Secondary | ICD-10-CM

## 2015-07-04 LAB — COMPREHENSIVE METABOLIC PANEL
ALT: <9 U/L (ref 0–55)
ANION GAP: 5 meq/L (ref 3–11)
AST: 14 U/L (ref 5–34)
Albumin: 2.9 g/dL — ABNORMAL LOW (ref 3.5–5.0)
Alkaline Phosphatase: 96 U/L (ref 40–150)
BUN: 24.2 mg/dL (ref 7.0–26.0)
CHLORIDE: 116 meq/L — AB (ref 98–109)
CO2: 22 meq/L (ref 22–29)
CREATININE: 3.5 mg/dL — AB (ref 0.7–1.3)
Calcium: 8.8 mg/dL (ref 8.4–10.4)
EGFR: 18 mL/min/{1.73_m2} — ABNORMAL LOW (ref >90)
GLUCOSE: 81 mg/dL (ref 70–140)
Potassium: 6.2 mEq/L (ref 3.5–5.1)
Sodium: 142 mEq/L (ref 136–145)
Total Protein: 7 g/dL (ref 6.4–8.3)

## 2015-07-04 LAB — CBC & DIFF AND RETIC
BASO%: 1.1 % (ref 0.0–2.0)
BASOS ABS: 0.1 10*3/uL (ref 0.0–0.1)
EOS ABS: 0.4 10*3/uL (ref 0.0–0.5)
EOS%: 6.4 % (ref 0.0–7.0)
HCT: 29.1 % — ABNORMAL LOW (ref 38.4–49.9)
HEMOGLOBIN: 9.9 g/dL — AB (ref 13.0–17.1)
IMMATURE RETIC FRACT: 5.9 % (ref 3.00–10.60)
LYMPH#: 2.7 10*3/uL (ref 0.9–3.3)
LYMPH%: 41 % (ref 14.0–49.0)
MCH: 28.4 pg (ref 27.2–33.4)
MCHC: 34 g/dL (ref 32.0–36.0)
MCV: 83.4 fL (ref 79.3–98.0)
MONO#: 0.4 10*3/uL (ref 0.1–0.9)
MONO%: 5.9 % (ref 0.0–14.0)
NEUT%: 45.6 % (ref 39.0–75.0)
NEUTROS ABS: 3 10*3/uL (ref 1.5–6.5)
Platelets: 250 10*3/uL (ref 140–400)
RBC: 3.49 10*6/uL — ABNORMAL LOW (ref 4.20–5.82)
RDW: 15.8 % — ABNORMAL HIGH (ref 11.0–14.6)
RETIC CT ABS: 40.48 10*3/uL (ref 34.80–93.90)
Retic %: 1.16 % (ref 0.80–1.80)
WBC: 6.6 10*3/uL (ref 4.0–10.3)

## 2015-07-04 MED ORDER — FUROSEMIDE 20 MG PO TABS
40.0000 mg | ORAL_TABLET | Freq: Once | ORAL | Status: AC
  Administered 2015-07-04: 40 mg via ORAL

## 2015-07-04 MED ORDER — CYANOCOBALAMIN 1000 MCG/ML IJ SOLN
1000.0000 ug | Freq: Once | INTRAMUSCULAR | Status: AC
  Administered 2015-07-04: 1000 ug via INTRAMUSCULAR

## 2015-07-04 NOTE — Progress Notes (Signed)
[facility]  [phone] [phone]) [phone]     ID: Caleb Hayes OB: 1929/05/15  MR#: [id number]  BCW#:[id number]  PCP: Pcp Not In System GYN:   SU:  OTHER MD:  CHIEF COMPLAINT: Pernicious anemia  CURRENT TREATMENT: B-12 supplementation   HX PRESENT ILLNESS: From the earlier summary note:  Caleb Hayes presented to the emergency room on 05/16/2013 with complaints of weakness, fatigue, and lower extremity pain. Exam was unrevealing but lab work showed a creatinine of 4.34 with BUN 89. Potassium, sodium, calcium, and total protein were normal. The white cell count was 3.7, platelet count 75,000 and hemoglobin 7.3 with an MCV of 98. Ferritin was 3619, folate 14.4, and B12 160. Bilateral venous Dopplers were consistent with acute deep venous thrombosis involving the right common femoral vein, right popliteal vein and the) Caleb Hayes. On the left side there was a posterior tibial vein thrombus of indeterminate age.   We were consulted to further evaluate the cytopenias and rule out multiple myeloma. In addition there was a question of anticoagulation in this patient with thrombocytopenia. The patient's subsequet history is as detailed below  INTERVAL HISTORY: Caleb Hayes returns today for followup of his pernicious anemia accompanied by his daughter Caleb Hayes. Caleb Hayes has been receiving monthly B-12 shots here without complication. He returns today for his annual routine visit.  REVIEW OF SYSTEMS: Caleb Hayes feels fine and when asked what his worse problem S he tells me he has no worse problems. However she has worsening kidney function and uncontrolled blood pressure. He tells me that he has been told not to eat bananas potatoes or tomatoes. However he has not been entirely compliant with his diet. He also tells me he was very faint and his blood pressure medication recently was changed. He is being evaluated through urology and an ultrasound of the kidneys  05/17/2013 showed no obstruction. He just had an MRI of the abdomen obtained by Caleb Hayes. This suggested possible bladder outlet obstruction.   PAST MEDICAL HISTORY: Past Medical History  Diagnosis Date  . Hypertension   . Gout     PAST SURGICAL HISTORY: No past surgical history on file.  FAMILY HISTORY No family history on file.  SOCIAL HISTORY:  The patient's daughter Caleb Hayes lives with him. She has a 3d shift job. The patient's sister lives nearby and "can come in" as needed.    ADVANCED DIRECTIVES: At his visit 07/04/2015 the patient was given advanced directive forms to complete and notarize. These were in fact completed but they are not yet scanned   HEALTH MAINTENANCE: Social History  Substance Use Topics  . Smoking status: Former Research scientist (life sciences)  . Smokeless tobacco: Not on file  . Alcohol Use: Yes     Comment: "every once in a while, not too regular"    No Known Allergies  Current Outpatient Prescriptions  Medication Sig Dispense Refill  . amLODipine (NORVASC) 5 MG tablet Take 5 mg by mouth daily.    . cephALEXin (KEFLEX) 500 MG capsule Take 1 capsule (500 mg total) by mouth 4 (four) times daily. 40 capsule 0  . COLCRYS 0.6 MG tablet TAKE 1/2 TABLET DAILY 30 tablet 0  . ferrous sulfate 325 (65 FE) MG tablet Take 1 tablet (325 mg total) by mouth 3 (three) times daily with meals. 90 tablet 3  . furosemide (LASIX) 40 MG tablet Take 40 mg by mouth daily.    . Multiple Vitamin (MULTIVITAMIN WITH MINERALS) TABS tablet Take 1 tablet  by mouth daily.    . pantoprazole (PROTONIX) 40 MG tablet Take 1 tablet (40 mg total) by mouth daily at 12 noon. 30 tablet 1  . senna-docusate (SENOKOT-S) 8.6-50 MG per tablet Take 2 tablets by mouth 2 (two) times daily. 30 tablet 0  . sodium bicarbonate 650 MG tablet Take 1 tablet (650 mg total) by mouth 2 (two) times daily. 60 tablet 1  . tamsulosin (FLOMAX) 0.4 MG CAPS capsule Take 1 capsule (0.4 mg total) by mouth daily after supper. 30  capsule 0   No current facility-administered medications for this visit.    OBJECTIVE: elderly African American man Who appears stated age 20 Vitals:   07/04/15 0943 07/04/15 0944  BP: 204/76 195/65  Pulse: 62   Temp: 97.7 F (36.5 C)   Resp: 18      Body mass index is 21.92 kg/(m^2).    ECOG FS:1 - Symptomatic but completely ambulatory  Sclerae unicteric, EOMs intact Oropharynx clear No cervical or supraclavicular adenopathy Lungs no rales or rhonchi Heart regular rate and rhythm Abd soft, nontender, positive bowel sounds MSK no focal spinal tenderness, no upper extremity lymphedema Neuro: nonfocal, well oriented, jovial affect  LAB RESULTS:  CMP     Component Value Date/Time   NA 142 07/04/2015 0908   NA 137 05/21/2013 0520   K 6.2* 07/04/2015 0908   K 3.9 05/21/2013 0520   CL 101 05/21/2013 0520   CO2 22 07/04/2015 0908   CO2 22 05/21/2013 0520   GLUCOSE 81 07/04/2015 0908   GLUCOSE 119* 05/21/2013 0520   BUN 24.2 07/04/2015 0908   BUN 59* 05/21/2013 0520   CREATININE 3.5* 07/04/2015 0908   CREATININE 2.95* 05/21/2013 0520   CALCIUM 8.8 07/04/2015 0908   CALCIUM 8.6 05/21/2013 0520   PROT 7.0 07/04/2015 0908   PROT 6.6 05/17/2013 1030   ALBUMIN 2.9* 07/04/2015 0908   ALBUMIN 2.3* 05/21/2013 0520   AST 14 07/04/2015 0908   AST 25 05/17/2013 1030   ALT <9 07/04/2015 0908   ALT 21 05/17/2013 1030   ALKPHOS 96 07/04/2015 0908   ALKPHOS 54 05/17/2013 1030   BILITOT <0.30 07/04/2015 0908   BILITOT 1.3* 05/17/2013 1030   GFRNONAA 19* 05/21/2013 0520   GFRAA 21* 05/21/2013 0520    I No results found for: SPEP  Lab Results  Component Value Date   WBC 10.0 05/26/2015   NEUTROABS 5.7 05/26/2015   HGB 10.9* 05/26/2015   HCT 31.9* 05/26/2015   MCV 83.1 05/26/2015   PLT 262 05/26/2015    @LASTCHEMISTRY @  No results found for: LABCA2  No components found for: LABCA125  No results for input(s): INR in the last 168 hours.  Urinalysis     Component Value Date/Time   COLORURINE YELLOW 08/05/2014 1446   APPEARANCEUR TURBID* 08/05/2014 1446   LABSPEC 1.010 08/05/2014 1446   PHURINE 6.0 08/05/2014 1446   GLUCOSEU NEGATIVE 08/05/2014 1446   HGBUR SMALL* 08/05/2014 1446   BILIRUBINUR NEGATIVE 08/05/2014 [place] 08/05/2014 1446   PROTEINUR 30* 08/05/2014 1446   UROBILINOGEN 0.2 08/05/2014 1446   NITRITE NEGATIVE 08/05/2014 1446   LEUKOCYTESUR LARGE* 08/05/2014 1446    STUDIES: EKGs Today showed bradycardia but were otherwise unremarkable  ASSESSMENT: 82 y.o. year old [place] man   (1) pernicious anemia with intrinsic factor antibodies documented March 2015  (2) renal insufficiency with negative myeloma workup   (3) Right lower extremity DVT March 2015, treated with coumadin  (4) poorly controlled hypertension  (  5) hyperkalemia  PLAN: Mr. Springborn's blood pressure today was poorly controlled. He was also found to have a potassium of 6.2. His creatinine was 3.5. We obtained electrocardiograms which showed no significant abnormality other than mild bradycardia (not a new problem). He was treated with an additional dose of Lasix. We reinforced renal cyst discussion of a low potassium diet. He was instructed to go back to his kidney doctors for improved blood pressure control and further management of his kidney failure.  Today he completed a health care power of attorney form and this was notarized as well. A copy will be placed in his chart.  Otherwise we are continuing to give Mr. Petsch his monthly B12 shot and he will see me again in one year. He will let us know if we can be f further help.   Chauncey Cruel, MD   07/04/2015 7:24 PM

## 2015-07-05 LAB — VITAMIN B12

## 2015-07-10 ENCOUNTER — Other Ambulatory Visit: Payer: Self-pay | Admitting: Oncology

## 2015-08-03 ENCOUNTER — Ambulatory Visit (HOSPITAL_BASED_OUTPATIENT_CLINIC_OR_DEPARTMENT_OTHER): Payer: Medicare Other

## 2015-08-03 VITALS — BP 180/77 | HR 72 | Temp 98.2°F

## 2015-08-03 DIAGNOSIS — D519 Vitamin B12 deficiency anemia, unspecified: Secondary | ICD-10-CM

## 2015-08-03 DIAGNOSIS — D510 Vitamin B12 deficiency anemia due to intrinsic factor deficiency: Secondary | ICD-10-CM

## 2015-08-03 MED ORDER — CYANOCOBALAMIN 1000 MCG/ML IJ SOLN
1000.0000 ug | Freq: Once | INTRAMUSCULAR | Status: AC
  Administered 2015-08-03: 1000 ug via INTRAMUSCULAR

## 2015-08-03 NOTE — Patient Instructions (Signed)

## 2015-08-31 ENCOUNTER — Ambulatory Visit: Payer: Medicare Other

## 2015-09-04 ENCOUNTER — Telehealth: Payer: Self-pay | Admitting: Oncology

## 2015-09-04 NOTE — Telephone Encounter (Signed)
returned call and s.w. pt and r/s missed appt....Marland Kitchenok and aware of new d.t.Marland Kitchen.will get new sched tomorrow

## 2015-09-05 ENCOUNTER — Ambulatory Visit (HOSPITAL_BASED_OUTPATIENT_CLINIC_OR_DEPARTMENT_OTHER): Payer: Medicare Other

## 2015-09-05 VITALS — BP 170/68 | Temp 97.9°F | Resp 18

## 2015-09-05 DIAGNOSIS — D510 Vitamin B12 deficiency anemia due to intrinsic factor deficiency: Secondary | ICD-10-CM | POA: Diagnosis not present

## 2015-09-05 MED ORDER — CYANOCOBALAMIN 1000 MCG/ML IJ SOLN
1000.0000 ug | Freq: Once | INTRAMUSCULAR | Status: AC
  Administered 2015-09-05: 1000 ug via INTRAMUSCULAR

## 2015-09-05 NOTE — Patient Instructions (Signed)

## 2015-09-28 ENCOUNTER — Ambulatory Visit: Payer: Medicare Other

## 2015-10-03 ENCOUNTER — Ambulatory Visit (HOSPITAL_BASED_OUTPATIENT_CLINIC_OR_DEPARTMENT_OTHER): Payer: Medicare Other

## 2015-10-03 VITALS — BP 220/83 | HR 72 | Temp 97.7°F

## 2015-10-03 DIAGNOSIS — D510 Vitamin B12 deficiency anemia due to intrinsic factor deficiency: Secondary | ICD-10-CM | POA: Diagnosis not present

## 2015-10-03 MED ORDER — CYANOCOBALAMIN 1000 MCG/ML IJ SOLN
1000.0000 ug | Freq: Once | INTRAMUSCULAR | Status: AC
  Administered 2015-10-03: 1000 ug via INTRAMUSCULAR

## 2015-10-03 NOTE — Patient Instructions (Signed)

## 2015-10-04 ENCOUNTER — Ambulatory Visit: Payer: Medicare Other

## 2015-10-26 ENCOUNTER — Ambulatory Visit: Payer: Medicare Other

## 2015-10-31 ENCOUNTER — Ambulatory Visit (HOSPITAL_BASED_OUTPATIENT_CLINIC_OR_DEPARTMENT_OTHER): Payer: Medicare Other

## 2015-10-31 VITALS — BP 170/70 | HR 70 | Temp 97.5°F | Resp 18

## 2015-10-31 DIAGNOSIS — D510 Vitamin B12 deficiency anemia due to intrinsic factor deficiency: Secondary | ICD-10-CM | POA: Diagnosis not present

## 2015-10-31 MED ORDER — CYANOCOBALAMIN 1000 MCG/ML IJ SOLN
1000.0000 ug | Freq: Once | INTRAMUSCULAR | Status: AC
  Administered 2015-10-31: 1000 ug via INTRAMUSCULAR

## 2015-10-31 NOTE — Patient Instructions (Signed)

## 2015-11-01 ENCOUNTER — Ambulatory Visit: Payer: Medicare Other

## 2015-11-06 DIAGNOSIS — E538 Deficiency of other specified B group vitamins: Secondary | ICD-10-CM | POA: Diagnosis not present

## 2015-11-06 DIAGNOSIS — I129 Hypertensive chronic kidney disease with stage 1 through stage 4 chronic kidney disease, or unspecified chronic kidney disease: Secondary | ICD-10-CM | POA: Diagnosis not present

## 2015-11-06 DIAGNOSIS — N184 Chronic kidney disease, stage 4 (severe): Secondary | ICD-10-CM | POA: Diagnosis not present

## 2015-11-06 DIAGNOSIS — N390 Urinary tract infection, site not specified: Secondary | ICD-10-CM | POA: Diagnosis not present

## 2015-11-06 DIAGNOSIS — N2581 Secondary hyperparathyroidism of renal origin: Secondary | ICD-10-CM | POA: Diagnosis not present

## 2015-11-11 ENCOUNTER — Encounter (HOSPITAL_COMMUNITY): Payer: Self-pay | Admitting: Emergency Medicine

## 2015-11-11 ENCOUNTER — Inpatient Hospital Stay (HOSPITAL_COMMUNITY)
Admission: EM | Admit: 2015-11-11 | Discharge: 2015-11-15 | DRG: 690 | Disposition: A | Discharge status: Home / Self Care | Payer: Medicare Other | Source: Ambulatory Visit | Attending: Internal Medicine | Admitting: Internal Medicine

## 2015-11-11 ENCOUNTER — Emergency Department (HOSPITAL_COMMUNITY): Payer: Medicare Other

## 2015-11-11 DIAGNOSIS — N179 Acute kidney failure, unspecified: Secondary | ICD-10-CM | POA: Diagnosis present

## 2015-11-11 DIAGNOSIS — N132 Hydronephrosis with renal and ureteral calculous obstruction: Secondary | ICD-10-CM | POA: Diagnosis not present

## 2015-11-11 DIAGNOSIS — R1030 Lower abdominal pain, unspecified: Secondary | ICD-10-CM | POA: Diagnosis present

## 2015-11-11 DIAGNOSIS — R339 Retention of urine, unspecified: Secondary | ICD-10-CM | POA: Diagnosis present

## 2015-11-11 DIAGNOSIS — B9620 Unspecified Escherichia coli [E. coli] as the cause of diseases classified elsewhere: Secondary | ICD-10-CM | POA: Diagnosis present

## 2015-11-11 DIAGNOSIS — E875 Hyperkalemia: Secondary | ICD-10-CM | POA: Diagnosis not present

## 2015-11-11 DIAGNOSIS — N1330 Unspecified hydronephrosis: Secondary | ICD-10-CM | POA: Diagnosis not present

## 2015-11-11 DIAGNOSIS — N3091 Cystitis, unspecified with hematuria: Principal | ICD-10-CM | POA: Diagnosis present

## 2015-11-11 DIAGNOSIS — M109 Gout, unspecified: Secondary | ICD-10-CM | POA: Diagnosis present

## 2015-11-11 DIAGNOSIS — N184 Chronic kidney disease, stage 4 (severe): Secondary | ICD-10-CM | POA: Diagnosis present

## 2015-11-11 DIAGNOSIS — Z87891 Personal history of nicotine dependence: Secondary | ICD-10-CM | POA: Diagnosis not present

## 2015-11-11 DIAGNOSIS — Z79899 Other long term (current) drug therapy: Secondary | ICD-10-CM | POA: Diagnosis not present

## 2015-11-11 DIAGNOSIS — D631 Anemia in chronic kidney disease: Secondary | ICD-10-CM | POA: Diagnosis not present

## 2015-11-11 DIAGNOSIS — N390 Urinary tract infection, site not specified: Secondary | ICD-10-CM | POA: Diagnosis present

## 2015-11-11 DIAGNOSIS — N185 Chronic kidney disease, stage 5: Secondary | ICD-10-CM

## 2015-11-11 DIAGNOSIS — D510 Vitamin B12 deficiency anemia due to intrinsic factor deficiency: Secondary | ICD-10-CM | POA: Diagnosis present

## 2015-11-11 DIAGNOSIS — I120 Hypertensive chronic kidney disease with stage 5 chronic kidney disease or end stage renal disease: Secondary | ICD-10-CM

## 2015-11-11 DIAGNOSIS — N3001 Acute cystitis with hematuria: Secondary | ICD-10-CM | POA: Diagnosis not present

## 2015-11-11 DIAGNOSIS — N400 Enlarged prostate without lower urinary tract symptoms: Secondary | ICD-10-CM | POA: Diagnosis not present

## 2015-11-11 DIAGNOSIS — I129 Hypertensive chronic kidney disease with stage 1 through stage 4 chronic kidney disease, or unspecified chronic kidney disease: Secondary | ICD-10-CM | POA: Diagnosis not present

## 2015-11-11 LAB — COMPREHENSIVE METABOLIC PANEL
ALT: 12 U/L — ABNORMAL LOW (ref 17–63)
ANION GAP: 11 (ref 5–15)
AST: 20 U/L (ref 15–41)
Albumin: 2.8 g/dL — ABNORMAL LOW (ref 3.5–5.0)
Alkaline Phosphatase: 86 U/L (ref 38–126)
BILIRUBIN TOTAL: 0.5 mg/dL (ref 0.3–1.2)
BUN: 71 mg/dL — ABNORMAL HIGH (ref 6–20)
CHLORIDE: 110 mmol/L (ref 101–111)
CO2: 17 mmol/L — ABNORMAL LOW (ref 22–32)
Calcium: 9.1 mg/dL (ref 8.9–10.3)
Creatinine, Ser: 6.4 mg/dL — ABNORMAL HIGH (ref 0.61–1.24)
GFR calc Af Amer: 8 mL/min — ABNORMAL LOW (ref >60)
GFR, EST NON AFRICAN AMERICAN: 7 mL/min — AB (ref >60)
Glucose, Bld: 183 mg/dL — ABNORMAL HIGH (ref 65–99)
POTASSIUM: 5.8 mmol/L — AB (ref 3.5–5.1)
Sodium: 138 mmol/L (ref 135–145)
TOTAL PROTEIN: 7.5 g/dL (ref 6.5–8.1)

## 2015-11-11 LAB — URINALYSIS, ROUTINE W REFLEX MICROSCOPIC
Bilirubin Urine: NEGATIVE
Glucose, UA: 100 mg/dL — AB
Ketones, ur: NEGATIVE mg/dL
NITRITE: NEGATIVE
Specific Gravity, Urine: 1.028 (ref 1.005–1.030)
pH: 7.5 (ref 5.0–8.0)

## 2015-11-11 LAB — BASIC METABOLIC PANEL
ANION GAP: 10 (ref 5–15)
BUN: 77 mg/dL — ABNORMAL HIGH (ref 6–20)
CHLORIDE: 110 mmol/L (ref 101–111)
CO2: 18 mmol/L — AB (ref 22–32)
Calcium: 8.6 mg/dL — ABNORMAL LOW (ref 8.9–10.3)
Creatinine, Ser: 6.72 mg/dL — ABNORMAL HIGH (ref 0.61–1.24)
GFR calc non Af Amer: 7 mL/min — ABNORMAL LOW (ref >60)
GFR, EST AFRICAN AMERICAN: 8 mL/min — AB (ref >60)
Glucose, Bld: 208 mg/dL — ABNORMAL HIGH (ref 65–99)
POTASSIUM: 5 mmol/L (ref 3.5–5.1)
Sodium: 138 mmol/L (ref 135–145)

## 2015-11-11 LAB — URINE MICROSCOPIC-ADD ON

## 2015-11-11 LAB — CBC WITH DIFFERENTIAL/PLATELET
BASOS ABS: 0 10*3/uL (ref 0.0–0.1)
Basophils Relative: 0 %
Eosinophils Absolute: 0 10*3/uL (ref 0.0–0.7)
Eosinophils Relative: 0 %
HEMATOCRIT: 35.9 % — AB (ref 39.0–52.0)
HEMOGLOBIN: 13 g/dL (ref 13.0–17.0)
LYMPHS PCT: 6 %
Lymphs Abs: 1.6 10*3/uL (ref 0.7–4.0)
MCH: 27.6 pg (ref 26.0–34.0)
MCHC: 36.2 g/dL — ABNORMAL HIGH (ref 30.0–36.0)
MCV: 76.2 fL — AB (ref 78.0–100.0)
Monocytes Absolute: 0.8 10*3/uL (ref 0.1–1.0)
Monocytes Relative: 3 %
NEUTROS ABS: 23 10*3/uL — AB (ref 1.7–7.7)
NEUTROS PCT: 90 %
PLATELETS: 293 10*3/uL (ref 150–400)
RBC: 4.71 MIL/uL (ref 4.22–5.81)
RDW: 15.5 % (ref 11.5–15.5)
WBC: 25.5 10*3/uL — AB (ref 4.0–10.5)

## 2015-11-11 MED ORDER — ENSURE ENLIVE PO LIQD
237.0000 mL | Freq: Two times a day (BID) | ORAL | Status: DC
  Administered 2015-11-12 – 2015-11-15 (×7): 237 mL via ORAL

## 2015-11-11 MED ORDER — ACETAMINOPHEN 650 MG RE SUPP: 650.0000 mg | Freq: Four times a day (QID) | RECTAL | Status: DC | PRN

## 2015-11-11 MED ORDER — DEXTROSE 50 % IV SOLN
1.0000 | Freq: Once | INTRAVENOUS | Status: AC
  Administered 2015-11-11: 50 mL via INTRAVENOUS
  Filled 2015-11-11: qty 50

## 2015-11-11 MED ORDER — HEPARIN SODIUM (PORCINE) 5000 UNIT/ML IJ SOLN
5000.0000 [IU] | Freq: Three times a day (TID) | INTRAMUSCULAR | Status: DC
  Administered 2015-11-12 – 2015-11-14 (×5): 5000 [IU] via SUBCUTANEOUS
  Filled 2015-11-11 (×6): qty 1

## 2015-11-11 MED ORDER — TAMSULOSIN HCL 0.4 MG PO CAPS
0.4000 mg | ORAL_CAPSULE | Freq: Every day | ORAL | Status: DC
Start: 2015-11-11 — End: 2015-11-11

## 2015-11-11 MED ORDER — ONDANSETRON HCL 4 MG PO TABS: 4.0000 mg | ORAL_TABLET | Freq: Four times a day (QID) | ORAL | Status: DC | PRN

## 2015-11-11 MED ORDER — INSULIN ASPART 100 UNIT/ML IV SOLN
10.0000 [IU] | Freq: Once | INTRAVENOUS | Status: AC
  Administered 2015-11-11: 10 [IU] via INTRAVENOUS
  Filled 2015-11-11: qty 0.1

## 2015-11-11 MED ORDER — DEXTROSE 5 % IV SOLN
1.0000 g | INTRAVENOUS | Status: DC
  Administered 2015-11-12 – 2015-11-14 (×3): 1 g via INTRAVENOUS
  Filled 2015-11-11 (×4): qty 10

## 2015-11-11 MED ORDER — SODIUM CHLORIDE 0.9 % IV BOLUS (SEPSIS)
500.0000 mL | Freq: Once | INTRAVENOUS | Status: AC
  Administered 2015-11-11: 500 mL via INTRAVENOUS

## 2015-11-11 MED ORDER — DEXTROSE 5 % IV SOLN
1.0000 g | Freq: Once | INTRAVENOUS | Status: AC
  Administered 2015-11-11: 1 g via INTRAVENOUS
  Filled 2015-11-11: qty 10

## 2015-11-11 MED ORDER — ONDANSETRON HCL 4 MG/2ML IJ SOLN: 4.0000 mg | Freq: Four times a day (QID) | INTRAMUSCULAR | Status: DC | PRN

## 2015-11-11 MED ORDER — SODIUM CHLORIDE 0.9 % IV SOLN
INTRAVENOUS | Status: DC
  Administered 2015-11-11 – 2015-11-15 (×6): via INTRAVENOUS

## 2015-11-11 MED ORDER — ACETAMINOPHEN 325 MG PO TABS: 650.0000 mg | ORAL_TABLET | Freq: Four times a day (QID) | ORAL | Status: DC | PRN

## 2015-11-11 MED ORDER — SODIUM POLYSTYRENE SULFONATE 15 GM/60ML PO SUSP
15.0000 g | ORAL | Status: AC
  Administered 2015-11-11 – 2015-11-12 (×4): 15 g via ORAL
  Filled 2015-11-11 (×6): qty 60

## 2015-11-11 NOTE — Progress Notes (Signed)
Floor coverage M. Donnal Debar was paged to review pts new labs per MD Arrien request, M. Lynch called and said labs looked fine.  Rosine Beat, RN

## 2015-11-11 NOTE — ED Provider Notes (Signed)
[facility] DEPT Provider Note   CSN: [id number] Arrival date & time: 11/11/15  1100     History   Chief Complaint Chief Complaint  Patient presents with  . Abdominal Pain  . Hematuria  . Dysuria    HPI Caleb [facility] is a 82 y.o. male.  The history is provided by the patient. No language interpreter was used.  Abdominal Pain   Associated symptoms include dysuria and hematuria.  Hematuria  Associated symptoms include abdominal pain.  Dysuria   Associated symptoms include hematuria.   Caleb [facility] is a 82 y.o. male who presents to the Emergency Department complaining of hematuria.  He reports bloody urine and dysuria since yesterday. He cannot describe how the blood appears in his urine but there is blood present. No fevers. He does have some nausea and vomiting since yesterday. He has some lower abdominal discomfort. He has chronic kidney disease with last creatinine of 4.2. His PCP did labs a week ago and noted that he had a urinary tract infection and antibiotics were called in but he was unable to pick them up. Sxs are moderate, constant, worsening.    Past Medical History:  Diagnosis Date  . Gout   . Hypertension     Patient Active Problem List   Diagnosis Date Noted  . UTI (urinary tract infection) 11/11/2015  . AKI (acute kidney injury) ([place]) 11/11/2015  . Pernicious anemia 07/04/2015  . DVT (deep venous thrombosis) ([place]) 05/20/2013  . Acute-on-chronic kidney injury ([place]) 05/17/2013  . Essential hypertension, benign 05/17/2013  . Gout 05/17/2013  . Urinary retention 05/17/2013  . Leg swelling 05/17/2013  . Poor appetite 05/17/2013  . Unspecified constipation 05/17/2013  . Anemia 05/16/2013  . Symptomatic anemia 05/16/2013    History reviewed. No pertinent surgical history.     Home Medications    Prior to Admission medications   Medication Sig Start Date End Date Taking? Authorizing Provider  Cyanocobalamin (VITAMIN B-12 IJ) Inject 1 mL as  directed every 30 (thirty) days.   Yes Historical Provider, MD  feeding supplement (ENSURE IMMUNE HEALTH) LIQD Take 237 mLs by mouth daily as needed (NUTRITIONAL).   Yes Historical Provider, MD  Multiple Vitamin (MULTIVITAMIN WITH MINERALS) TABS tablet Take 1 tablet by mouth daily. 05/21/13  Yes Barton Dubois, MD  COLCRYS 0.6 MG tablet TAKE 1/2 TABLET DAILY Patient not taking: Reported on 11/11/2015 04/19/15   Chauncey Cruel, MD  ferrous sulfate 325 (65 FE) MG tablet Take 1 tablet (325 mg total) by mouth 3 (three) times daily with meals. Patient not taking: Reported on 11/11/2015 05/21/13   Barton Dubois, MD  pantoprazole (PROTONIX) 40 MG tablet Take 1 tablet (40 mg total) by mouth daily at 12 noon. Patient not taking: Reported on 11/11/2015 05/21/13   Barton Dubois, MD  senna-docusate (SENOKOT-S) 8.6-50 MG per tablet Take 2 tablets by mouth 2 (two) times daily. Patient not taking: Reported on 11/11/2015 05/21/13   Barton Dubois, MD  sodium bicarbonate 650 MG tablet Take 1 tablet (650 mg total) by mouth 2 (two) times daily. Patient not taking: Reported on 11/11/2015 05/21/13   Barton Dubois, MD    Family History History reviewed. No pertinent family history.  Social History Social History  Substance Use Topics  . Smoking status: Former Research scientist (life sciences)  . Smokeless tobacco: Never Used  . Alcohol use Yes     Comment: "every once in a while, not too regular"     Allergies   Review of patient's allergies  indicates no known allergies.   Review of Systems Review of Systems  Gastrointestinal: Positive for abdominal pain.  Genitourinary: Positive for dysuria and hematuria.  All other systems reviewed and are negative.    Physical Exam Updated Vital Signs BP (!) 165/97 (BP Location: Left Arm)   Pulse 98   Temp 97.9 F (36.6 C) (Oral)   Resp 18   Ht 5\' 6"  (1.676 m)   Wt 130 lb 6.4 oz (59.1 kg)   SpO2 100%   BMI 21.05 kg/m   Physical Exam  Constitutional: He is oriented to person, place, and  time. He appears well-developed and well-nourished.  HENT:  Head: Normocephalic and atraumatic.  Cardiovascular: Normal rate and regular rhythm.   No murmur heard. Pulmonary/Chest: Effort normal and breath sounds normal. No respiratory distress.  Abdominal: Soft. There is no rebound and no guarding.  Mild suprapubic tenderness  Musculoskeletal: He exhibits no edema or tenderness.  Neurological: He is alert and oriented to person, place, and time.  Skin: Skin is warm and dry.  Psychiatric: He has a normal mood and affect. His behavior is normal.  Nursing note and vitals reviewed.    ED Treatments / Results  Labs (all labs ordered are listed, but only abnormal results are displayed) Labs Reviewed  URINALYSIS, ROUTINE W REFLEX MICROSCOPIC (NOT AT [facility]) - Abnormal; Notable for the following:       Result Value   Color, Urine RED (*)    APPearance TURBID (*)    Glucose, UA 100 (*)    Hgb urine dipstick LARGE (*)    Protein, ur >300 (*)    Leukocytes, UA MODERATE (*)    All other components within normal limits  URINE MICROSCOPIC-ADD ON - Abnormal; Notable for the following:    Squamous Epithelial / LPF 6-30 (*)    Bacteria, UA MANY (*)    All other components within normal limits  COMPREHENSIVE METABOLIC PANEL - Abnormal; Notable for the following:    Potassium 5.8 (*)    CO2 17 (*)    Glucose, Bld 183 (*)    BUN 71 (*)    Creatinine, Ser 6.40 (*)    Albumin 2.8 (*)    ALT 12 (*)    GFR calc non Af Amer 7 (*)    GFR calc Af Amer 8 (*)    All other components within normal limits  CBC WITH DIFFERENTIAL/PLATELET - Abnormal; Notable for the following:    WBC 25.5 (*)    HCT 35.9 (*)    MCV 76.2 (*)    MCHC 36.2 (*)    Neutro Abs 23.0 (*)    All other components within normal limits  URINE CULTURE  COMPREHENSIVE METABOLIC PANEL  CBC    EKG  EKG Interpretation None       Radiology Ct Renal Stone Study  Result Date: 11/11/2015 CLINICAL DATA:  Mid abdominal pain,  hematuria and painful urination, difficulty urinating since yesterday, history essential benign hypertension, gout, kidney injury EXAM: CT ABDOMEN AND PELVIS WITHOUT CONTRAST TECHNIQUE: Multidetector CT imaging of the abdomen and pelvis was performed following the standard protocol without IV contrast. Oral contrast not administered for this indication. Sagittal and coronal MPR images reconstructed from axial data set. COMPARISON:  08/24/2014 FINDINGS: Lower chest: Mild dependent atelectasis at lung bases greater on RIGHT. Hepatobiliary: 14 x 13 mm RIGHT lobe hepatic cyst image 29 unchanged. Gallbladder liver otherwise normal appearance. Pancreas: Normal appearance Spleen: Normal size. Multiple calcified granulomata. No soft tissue  mass. Adrenals/Urinary Tract: Minimal adrenal thickening without discrete mass. Increased atrophy and cortical thinning of RIGHT kidney. Moderate RIGHT hydronephrosis and hydroureter extending to bladder. Mild LEFT hydronephrosis and hydroureter extending to bladder. Single tiny nonobstructing RIGHT renal calculus image 31. No additional urinary tract calcification or renal mass. Marked bladder wall thickening diffusely with extensive perivesicular edema/infiltration. Prostate gland normal size. Stomach/Bowel: Normal appendix. Question mild rectal wall thickening. Stomach collapsed, wall thickness suboptimally assessed. Remaining bowel loops normal appearance. Vascular/Lymphatic: Atherosclerotic calcifications. Aorta normal caliber. No adenopathy. Reproductive: N/A Other: Small amount of low-attenuation free fluid at the pericolic gutters bilaterally and perihepatic. Edema in the pre vesicular space extends throughout the pelvis to the presacral spaces and perirectal fat. No definite hernias. No free intraperitoneal air. Musculoskeletal: Bones demineralized. Large Schmorl's nodes at T12 and L3, smaller at additional levels. Bones demineralized. No acute osseous findings. IMPRESSION:  Marked bladder wall thickening with BILATERAL hydronephrosis and hydroureter with associated extensive perivesicular infiltration most likely representing severe cystitis. Tiny nonobstructing RIGHT renal calculus with increased RIGHT renal atrophy since previous exam. Pelvic extraperitoneal edema and small amount of ascites likely related to bladder process. Question mild rectal wall thickening, recommend correlation with digital rectal exam and follow-up proctoscopy. Electronically Signed   By: Lavonia Dana M.D.   On: 11/11/2015 15:46    Procedures Procedures (including critical care time)  Medications Ordered in ED Medications  cefTRIAXone (ROCEPHIN) 1 g in dextrose 5 % 50 mL IVPB (not administered)  sodium polystyrene (KAYEXALATE) 15 GM/60ML suspension 15 g (15 g Oral Given 11/11/15 1901)  heparin injection 5,000 Units (not administered)  0.9 %  sodium chloride infusion ( Intravenous New Bag/Given 11/11/15 1901)  acetaminophen (TYLENOL) tablet 650 mg (not administered)    Or  acetaminophen (TYLENOL) suppository 650 mg (not administered)  ondansetron (ZOFRAN) tablet 4 mg (not administered)    Or  ondansetron (ZOFRAN) injection 4 mg (not administered)  feeding supplement (ENSURE ENLIVE) (ENSURE ENLIVE) liquid 237 mL (not administered)  sodium chloride 0.9 % bolus 500 mL (0 mLs Intravenous Stopped 11/11/15 1514)  cefTRIAXone (ROCEPHIN) 1 g in dextrose 5 % 50 mL IVPB (0 g Intravenous Stopped 11/11/15 1515)  dextrose 50 % solution 50 mL (50 mLs Intravenous Given 11/11/15 1900)  insulin aspart (novoLOG) injection 10 Units (10 Units Intravenous Given 11/11/15 1859)     Initial Impression / Assessment and Plan / ED Course  I have reviewed the triage vital signs and the nursing notes.  Pertinent labs & imaging results that were available during my care of the patient were reviewed by me and considered in my medical decision making (see chart for details).  Clinical Course    Pt with CKD here with  hematuria, lower abdominal pain, vomiting.  BMP with worsening renal function, CBC with significant leukocytosis, UA c/w UTI.  Treating UTI with abx.  CT stone study obtained in setting of hematuria, ARF - CT with bilateral hydro and severe cystitis.  Foley catheter placed due to concern for bladder outlet obstruction contributing to renal failure.  Plan to admit to hospitalist service for further treatment.  Pt updated of findings of studies and agrees with admission.  Final Clinical Impressions(s) / ED Diagnoses   Final diagnoses:  AKI (acute kidney injury) [facility])    New Prescriptions Current Discharge Medication List       Quintella Reichert, MD 11/12/15 [phone]

## 2015-11-11 NOTE — H&P (Signed)
History and Physical    Caleb Hayes [id number] DOB: 08-24-1929 DOA: 11/11/2015  PCP: Pcp Not In System   Patient coming from:  Home  Chief Complaint: Abdominal pain  HPI: Caleb [facility] is a 82 y.o. male with medical history significant of BPH, who presents to the [facility] with worsening abdominal pain and difficulty passing urine. For the last week he has been developing abdominal pain in the lower region, dull in nature, moderate in intensity, with no improving or worsening factors, associated with difficulty urinating. He was seen about 4 days ago by his primary care physician and he was diagnosed with urinary tract infection, he has been unable to fill the prescription for the antibiotic therapy. He was diagnosed in the past with enlarged prostate apparently he was Flomax, but he has stop taking it.  ED Course: IV fluids and Foley catheter  Review of Systems:  1. General appears or chills, waking of weight loss 2. Cardiovascular no angina or claudication 3. Pulmonary no shortness of breath or cough 4. Musculoskeletal no joint pain 5. Gastrointestinal no nausea vomiting or diarrhea 6. Hematology no easy bruisability or frequent infections 7. Neurology no seizures or paresthesias 8. Endocrine no tremors heat or cold intolerance 9. Neurology positive for difficulty passing urine as mentioned in the history present illness, positive for dysuria and increased urinary frequency 10. Psych no depression or anxiety.  Past Medical History:  Diagnosis Date  . Gout   . Hypertension     History reviewed. No pertinent surgical history.   reports that he has quit smoking. He has never used smokeless tobacco. He reports that he drinks alcohol. He reports that he does not use drugs.  No Known Allergies  History reviewed. No pertinent family history. Was reviewed and found to be noncontributory  Prior to Admission medications   Medication Sig Start Date End Date Taking?  Authorizing Provider  Cyanocobalamin (VITAMIN B-12 IJ) Inject 1 mL as directed every 30 (thirty) days.   Yes Historical Provider, MD  feeding supplement (ENSURE IMMUNE HEALTH) LIQD Take 237 mLs by mouth daily as needed (NUTRITIONAL).   Yes Historical Provider, MD  Multiple Vitamin (MULTIVITAMIN WITH MINERALS) TABS tablet Take 1 tablet by mouth daily. 05/21/13  Yes Barton Dubois, MD  COLCRYS 0.6 MG tablet TAKE 1/2 TABLET DAILY Patient not taking: Reported on 11/11/2015 04/19/15   Chauncey Cruel, MD  ferrous sulfate 325 (65 FE) MG tablet Take 1 tablet (325 mg total) by mouth 3 (three) times daily with meals. Patient not taking: Reported on 11/11/2015 05/21/13   Barton Dubois, MD  pantoprazole (PROTONIX) 40 MG tablet Take 1 tablet (40 mg total) by mouth daily at 12 noon. Patient not taking: Reported on 11/11/2015 05/21/13   Barton Dubois, MD  senna-docusate (SENOKOT-S) 8.6-50 MG per tablet Take 2 tablets by mouth 2 (two) times daily. Patient not taking: Reported on 11/11/2015 05/21/13   Barton Dubois, MD  sodium bicarbonate 650 MG tablet Take 1 tablet (650 mg total) by mouth 2 (two) times daily. Patient not taking: Reported on 11/11/2015 05/21/13   Barton Dubois, MD    Physical Exam: Vitals:   11/11/15 1056 11/11/15 1313 11/11/15 1522 11/11/15 1738  BP: 110/83 112/76 129/78 132/99  Pulse: 97 90 100 87  Resp: 22 18 18 18   Temp: 97.9 F (36.6 C) 98 F (36.7 C)    TempSrc: Oral Oral    SpO2: 99% 100% 99% 99%      Constitutional:  Deconditioned ill-looking appearing  Vitals:   11/11/15 1056 11/11/15 1313 11/11/15 1522 11/11/15 1738  BP: 110/83 112/76 129/78 132/99  Pulse: 97 90 100 87  Resp: 22 18 18 18   Temp: 97.9 F (36.6 C) 98 F (36.7 C)    TempSrc: Oral Oral    SpO2: 99% 100% 99% 99%   Eyes: PERRL, lids and conjunctivae normal ENMT: Mucous membranes are dry. Posterior pharynx clear of any exudate or lesions.Normal dentition.  Neck: normal, supple, no masses, no thyromegaly Respiratory:  clear to auscultation bilaterally, no wheezing, no crackles. Normal respiratory effort. No accessory muscle use.  Cardiovascular: Regular rate and rhythm, no murmurs / rubs / gallops. No extremity edema. 2+ pedal pulses. No carotid bruits.  Abdomen:  tenderness at the suprapubic area, no masses palpated. No hepatosplenomegaly. Bowel sounds positive.  Musculoskeletal: no clubbing / cyanosis. No joint deformity upper and lower extremities. Good ROM, no contractures. Normal muscle tone.  Skin: no rashes, lesions, ulcers. No induration Neurologic: CN 2-12 grossly intact. Sensation intact, DTR normal. Strength 5/5 in all 4.    Labs on Admission: I have personally reviewed following labs and imaging studies  CBC:  Recent Labs Lab 11/11/15 1410  WBC 25.5*  NEUTROABS 23.0*  HGB 13.0  HCT 35.9*  MCV 76.2*  PLT 0000000   Basic Metabolic Panel:  Recent Labs Lab 11/11/15 1410  NA 138  K 5.8*  CL 110  CO2 17*  GLUCOSE 183*  BUN 71*  CREATININE 6.40*  CALCIUM 9.1   GFR: CrCl cannot be calculated (Unknown ideal weight.). Liver Function Tests:  Recent Labs Lab 11/11/15 1410  AST 20  ALT 12*  ALKPHOS 86  BILITOT 0.5  PROT 7.5  ALBUMIN 2.8*   No results for input(s): LIPASE, AMYLASE in the last 168 hours. No results for input(s): AMMONIA in the last 168 hours. Coagulation Profile: No results for input(s): INR, PROTIME in the last 168 hours. Cardiac Enzymes: No results for input(s): CKTOTAL, CKMB, CKMBINDEX, TROPONINI in the last 168 hours. BNP (last 3 results) No results for input(s): PROBNP in the last 8760 hours. HbA1C: No results for input(s): HGBA1C in the last 72 hours. CBG: No results for input(s): GLUCAP in the last 168 hours. Lipid Profile: No results for input(s): CHOL, HDL, LDLCALC, TRIG, CHOLHDL, LDLDIRECT in the last 72 hours. Thyroid Function Tests: No results for input(s): TSH, T4TOTAL, FREET4, T3FREE, THYROIDAB in the last 72 hours. Anemia Panel: No results  for input(s): VITAMINB12, FOLATE, FERRITIN, TIBC, IRON, RETICCTPCT in the last 72 hours. Urine analysis:    Component Value Date/Time   COLORURINE RED (A) 11/11/2015 1310   APPEARANCEUR TURBID (A) 11/11/2015 1310   LABSPEC 1.028 11/11/2015 1310   PHURINE 7.5 11/11/2015 1310   GLUCOSEU 100 (A) 11/11/2015 1310   HGBUR LARGE (A) 11/11/2015 1310   BILIRUBINUR NEGATIVE 11/11/2015 1310   KETONESUR NEGATIVE 11/11/2015 1310   PROTEINUR >300 (A) 11/11/2015 1310   UROBILINOGEN 0.2 08/05/2014 1446   NITRITE NEGATIVE 11/11/2015 1310   LEUKOCYTESUR MODERATE (A) 11/11/2015 1310   Sepsis Labs: !!!!!!!!!!!!!!!!!!!!!!!!!!!!!!!!!!!!!!!!!!!! @LABRCNTIP (procalcitonin:4,lacticidven:4) )No results found for this or any previous visit (from the past 240 hour(s)).   Radiological Exams on Admission: Ct Renal Stone Study  Result Date: 11/11/2015 CLINICAL DATA:  Mid abdominal pain, hematuria and painful urination, difficulty urinating since yesterday, history essential benign hypertension, gout, kidney injury EXAM: CT ABDOMEN AND PELVIS WITHOUT CONTRAST TECHNIQUE: Multidetector CT imaging of the abdomen and pelvis was performed following the standard protocol without IV contrast. Oral contrast not  administered for this indication. Sagittal and coronal MPR images reconstructed from axial data set. COMPARISON:  08/24/2014 FINDINGS: Lower chest: Mild dependent atelectasis at lung bases greater on RIGHT. Hepatobiliary: 14 x 13 mm RIGHT lobe hepatic cyst image 29 unchanged. Gallbladder liver otherwise normal appearance. Pancreas: Normal appearance Spleen: Normal size. Multiple calcified granulomata. No soft tissue mass. Adrenals/Urinary Tract: Minimal adrenal thickening without discrete mass. Increased atrophy and cortical thinning of RIGHT kidney. Moderate RIGHT hydronephrosis and hydroureter extending to bladder. Mild LEFT hydronephrosis and hydroureter extending to bladder. Single tiny nonobstructing RIGHT renal calculus  image 31. No additional urinary tract calcification or renal mass. Marked bladder wall thickening diffusely with extensive perivesicular edema/infiltration. Prostate gland normal size. Stomach/Bowel: Normal appendix. Question mild rectal wall thickening. Stomach collapsed, wall thickness suboptimally assessed. Remaining bowel loops normal appearance. Vascular/Lymphatic: Atherosclerotic calcifications. Aorta normal caliber. No adenopathy. Reproductive: N/A Other: Small amount of low-attenuation free fluid at the pericolic gutters bilaterally and perihepatic. Edema in the pre vesicular space extends throughout the pelvis to the presacral spaces and perirectal fat. No definite hernias. No free intraperitoneal air. Musculoskeletal: Bones demineralized. Large Schmorl's nodes at T12 and L3, smaller at additional levels. Bones demineralized. No acute osseous findings. IMPRESSION: Marked bladder wall thickening with BILATERAL hydronephrosis and hydroureter with associated extensive perivesicular infiltration most likely representing severe cystitis. Tiny nonobstructing RIGHT renal calculus with increased RIGHT renal atrophy since previous exam. Pelvic extraperitoneal edema and small amount of ascites likely related to bladder process. Question mild rectal wall thickening, recommend correlation with digital rectal exam and follow-up proctoscopy. Electronically Signed   By: Lavonia Dana M.D.   On: 11/11/2015 15:46      Assessment/Plan Active Problems:   UTI (urinary tract infection)   AKI (acute kidney injury) [facility])   This is a 82 year old gentleman who presents to the [facility] with abdominal pain and difficulty passing urine. He has been diagnosed with BPH in the past, he has been off medical therapy. For last week his symptoms have been worsening. He has been recently diagnosed with a urinary tract infection. On his initial physical examination he is afebrile, blood pressure is 132/99, heart rate 87, respiratory 18,  saturation 99% room air. His oral mucosa is dry, his lungs are clear to auscultation, heart S1-S2 present rhythmic, his abdomen he does have suprapubic tenderness. His sodium is 138, potassium 5.8, creatinine 6.4 from 3.5, BUN 71, glucose 183, white count 25.5, hemoglobin 13.0, hematocrit 35.9, platelet count 293. His urinalysis is positive for proteinuria more than 300mg / dl, pyuria and hematuria. This can with marked bladder wall thickening with bilateral hydronephrosis and hydroureter associated with extensive perivesicular infiltration from severe cystitis.   Working diagnosis. 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 Acute kidney injury and hyperkalemia. Patient will be hydrated with a normal saline at 100 ml per hour, monitor urine output, Foley catheter has been placed in the emergency department. Follow BMP in 4 hours. Will check EKG to assess any changes related to hyperkalemia. Patient will receive 10 units of insulin IV along with 1 amp of D50. Will start patient on Kayexalate.  2. Obstructive uropathy. Foley catheter has been placed in the emergency department, continue Flomax. Will need urology evaluation.With follow-up ultrasound morning to assess improvement of hydronephrosis.  3. Severe infiltrative cystitis/ urinary tract infection. Continue antibiotic therapy with ceftriaxone, will follow-up on urinary culture, cell count and temperature curve.  4. Patient continue high risk of developing  worsening acute kidney injury and hyperkalemia.   DVT prophylaxis:  heparin Code Status: full Family Communication: I spoke with his daughter at the bedside and all questions were addressed. Key information for patient's care was obtained. Disposition Plan: Home Consults called: none Admission status: Inpatient   Danean Marner Gerome Apley MD [facility] Pager [phone]  If 7PM-7AM, please  contact night-coverage [email or web address] Password [email or web address]  11/11/2015, 5:59 PM

## 2015-11-11 NOTE — ED Triage Notes (Signed)
Pt c/o mid abdominal pain, hematuria and painful urination. Pt reports difficulty urinating since yesterday.

## 2015-11-12 ENCOUNTER — Inpatient Hospital Stay (HOSPITAL_COMMUNITY): Payer: Medicare Other

## 2015-11-12 LAB — COMPREHENSIVE METABOLIC PANEL
ALBUMIN: 2.5 g/dL — AB (ref 3.5–5.0)
ALT: 18 U/L (ref 17–63)
ANION GAP: 10 (ref 5–15)
AST: 31 U/L (ref 15–41)
Alkaline Phosphatase: 75 U/L (ref 38–126)
BILIRUBIN TOTAL: 0.3 mg/dL (ref 0.3–1.2)
BUN: 80 mg/dL — ABNORMAL HIGH (ref 6–20)
CO2: 19 mmol/L — ABNORMAL LOW (ref 22–32)
Calcium: 8.9 mg/dL (ref 8.9–10.3)
Chloride: 112 mmol/L — ABNORMAL HIGH (ref 101–111)
Creatinine, Ser: 6.65 mg/dL — ABNORMAL HIGH (ref 0.61–1.24)
GFR calc Af Amer: 8 mL/min — ABNORMAL LOW (ref >60)
GFR calc non Af Amer: 7 mL/min — ABNORMAL LOW (ref >60)
GLUCOSE: 146 mg/dL — AB (ref 65–99)
POTASSIUM: 4.6 mmol/L (ref 3.5–5.1)
Sodium: 141 mmol/L (ref 135–145)
TOTAL PROTEIN: 6.8 g/dL (ref 6.5–8.1)

## 2015-11-12 LAB — CBC
HEMATOCRIT: 30.1 % — AB (ref 39.0–52.0)
HEMOGLOBIN: 10.7 g/dL — AB (ref 13.0–17.0)
MCH: 27.3 pg (ref 26.0–34.0)
MCHC: 35.5 g/dL (ref 30.0–36.0)
MCV: 76.8 fL — ABNORMAL LOW (ref 78.0–100.0)
Platelets: 316 10*3/uL (ref 150–400)
RBC: 3.92 MIL/uL — AB (ref 4.22–5.81)
RDW: 15.4 % (ref 11.5–15.5)
WBC: 20.5 10*3/uL — AB (ref 4.0–10.5)

## 2015-11-12 MED ORDER — SODIUM CHLORIDE 0.9 % IV BOLUS (SEPSIS)
250.0000 mL | Freq: Once | INTRAVENOUS | Status: AC
  Administered 2015-11-12: 250 mL via INTRAVENOUS

## 2015-11-12 MED ORDER — TAMSULOSIN HCL 0.4 MG PO CAPS
0.4000 mg | ORAL_CAPSULE | Freq: Every day | ORAL | Status: DC
  Administered 2015-11-12 – 2015-11-14 (×3): 0.4 mg via ORAL
  Filled 2015-11-12 (×3): qty 1

## 2015-11-12 NOTE — Progress Notes (Signed)
Report received from K. Seay,RN. No change in assessment. Continue plan of care. Caleb [facility]

## 2015-11-12 NOTE — Progress Notes (Signed)
MD made aware of patient's urine output of 175 ml in 8 hours this shift. No signs of edema or fluid overload. Foley catheter intact and draining with no complications. 250 ml NS bolus ordered, along with increase IVF to 125 ml/hr. Will continue to monitor.

## 2015-11-12 NOTE — Progress Notes (Signed)
PROGRESS NOTE                                                                                                                                                                                                             Patient Demographics:    Caleb Hayes, is a 82 y.o. male, DOB - 1929/07/08, [id number]  Admit date - 11/11/2015   Admitting Physician Mauricio Gerome Apley, MD  Outpatient Primary MD for the patient is Pcp Not In System  LOS - 1   Chief Complaint  Patient presents with  . Abdominal Pain  . Hematuria  . Dysuria       Brief Narrative   82 y.o. male with medical history significant of BPH, who presents to the hospital with worsening abdominal pain and difficulty passing urine, CT renal significant for bilateral hydronephrosis, and severe cystitis.  Subjective:    Caleb Hayes today has, No headache, No chest pain, No abdominal pain - No Nausea,  No Cough - SOB.    Assessment  & Plan :    Active Problems:   UTI (urinary tract infection)   AKI (acute kidney injury) ([place])   Acute on chronic CKD stage IV - Patient baseline creatinine has been between 3-5 in the past, creatinine 6.5 on admission - This is most likely related to obstructive uropathy given evidence of bilateral hydronephrosis on CT renal study. - Renal ultrasound today Moderate right hydronephrosis. - Continue with IV fluids and monitor renal function closely, avoid nephrotoxic medication  Obstructive uropathy - This is most likely related to BPH, will start on Flomax, continue with Foley catheter, will check daughter who is patient's primary urologist.  UTI/severe infiltrative cystitis - Continue with IV Rocephin, follow on urine cultures - Leukocytosis trending down  Pernicious anemia/B 12 deficiency - Patient is following with Dr. Jana Hakim for B-12 monthly injections  Hyperkalemia - Continue to acute on chronic renal failure, improving, continue to  monitor     Code Status : Full  Family Communication  : none at bedside  Disposition Plan  : pending PT  Consults  :  None  Procedures  : None  DVT Prophylaxis  :   Heparin  Lab Results  Component Value Date   PLT 316 11/12/2015    Antibiotics  :    Anti-infectives    Start  Dose/Rate Route Frequency Ordered Stop   11/12/15 1400  cefTRIAXone (ROCEPHIN) 1 g in dextrose 5 % 50 mL IVPB     1 g 100 mL/hr over 30 Minutes Intravenous Every 24 hours 11/11/15 1757     11/11/15 1400  cefTRIAXone (ROCEPHIN) 1 g in dextrose 5 % 50 mL IVPB     1 g 100 mL/hr over 30 Minutes Intravenous  Once 11/11/15 1357 11/11/15 1515        Objective:   Vitals:   11/11/15 1800 11/11/15 2200 11/12/15 0529 11/12/15 1306  BP: (!) 165/97 127/70 (!) 146/78 (!) 152/66  Pulse: 98 89 88 87  Resp: 18 20 20 20   Temp: 97.9 F (36.6 C) 98.3 F (36.8 C) 98.5 F (36.9 C) 97.6 F (36.4 C)  TempSrc: Oral Oral Oral Oral  SpO2: 100% 100% 100% 100%  Weight: 59.1 kg (130 lb 6.4 oz)     Height: 5\' 6"  (1.676 m)       Wt Readings from Last 3 Encounters:  11/11/15 59.1 kg (130 lb 6.4 oz)  07/04/15 67.4 kg (148 lb 8 oz)  07/01/14 74 kg (163 lb 3.2 oz)     Intake/Output Summary (Last 24 hours) at 11/12/15 1344 Last data filed at 11/12/15 1309  Gross per 24 hour  Intake          1818.33 ml  Output              675 ml  Net          1143.33 ml     Physical Exam  Awake Alert, Oriented X 3, Supple Neck,No JVD,  Symmetrical Chest wall movement, Good air movement bilaterally, CTAB RRR,No Gallops,Rubs or new Murmurs, No Parasternal Heave +ve B.Sounds, Abd Soft, Mild suprapubic tenderness, No rebound - guarding or rigidity. No Cyanosis, Clubbing or edema, No new Rash or bruise      Data Review:    CBC  Recent Labs Lab 11/11/15 1410 11/12/15 0558  WBC 25.5* 20.5*  HGB 13.0 10.7*  HCT 35.9* 30.1*  PLT 293 316  MCV 76.2* 76.8*  MCH 27.6 27.3  MCHC 36.2* 35.5  RDW 15.5 15.4    LYMPHSABS 1.6  --   MONOABS 0.8  --   EOSABS 0.0  --   BASOSABS 0.0  --     Chemistries   Recent Labs Lab 11/11/15 1410 11/11/15 2102 11/12/15 0558  NA 138 138 141  K 5.8* 5.0 4.6  CL 110 110 112*  CO2 17* 18* 19*  GLUCOSE 183* 208* 146*  BUN 71* 77* 80*  CREATININE 6.40* 6.72* 6.65*  CALCIUM 9.1 8.6* 8.9  AST 20  --  31  ALT 12*  --  18  ALKPHOS 86  --  75  BILITOT 0.5  --  0.3   ------------------------------------------------------------------------------------------------------------------ No results for input(s): CHOL, HDL, LDLCALC, TRIG, CHOLHDL, LDLDIRECT in the last 72 hours.  No results found for: HGBA1C ------------------------------------------------------------------------------------------------------------------ No results for input(s): TSH, T4TOTAL, T3FREE, THYROIDAB in the last 72 hours.  Invalid input(s): FREET3 ------------------------------------------------------------------------------------------------------------------ No results for input(s): VITAMINB12, FOLATE, FERRITIN, TIBC, IRON, RETICCTPCT in the last 72 hours.  Coagulation profile No results for input(s): INR, PROTIME in the last 168 hours.  No results for input(s): DDIMER in the last 72 hours.  Cardiac Enzymes No results for input(s): CKMB, TROPONINI, MYOGLOBIN in the last 168 hours.  Invalid input(s): CK ------------------------------------------------------------------------------------------------------------------ No results found for: BNP  Inpatient Medications  Scheduled Meds: . cefTRIAXone (ROCEPHIN)  IV  1 g Intravenous Q24H  . feeding supplement (ENSURE ENLIVE)  237 mL Oral BID BM  . heparin  5,000 Units Subcutaneous Q8H   Continuous Infusions: . sodium chloride 100 mL/hr at 11/11/15 1901   PRN Meds:.acetaminophen **OR** acetaminophen, ondansetron **OR** ondansetron (ZOFRAN) IV  Micro Results No results found for this or any previous visit (from the past 240  hour(s)).  Radiology Reports US Renal  Result Date: 11/12/2015 CLINICAL DATA:  Follow-up hydronephrosis, elevated creatinine EXAM: RENAL / URINARY TRACT ULTRASOUND COMPLETE COMPARISON:  CT abdomen/pelvis dated 11/11/2015 FINDINGS: Right Kidney: Length: 8.6 cm.  Moderate hydronephrosis. Left Kidney: Length: 9.3 cm.  No mass or hydronephrosis. Bladder: Decompressed by indwelling Foley catheter. IMPRESSION: Moderate right hydronephrosis. Bladder decompressed by indwelling Foley catheter. Electronically Signed   By: Julian Hy M.D.   On: 11/12/2015 09:18   Ct Renal Stone Study  Result Date: 11/11/2015 CLINICAL DATA:  Mid abdominal pain, hematuria and painful urination, difficulty urinating since yesterday, history essential benign hypertension, gout, kidney injury EXAM: CT ABDOMEN AND PELVIS WITHOUT CONTRAST TECHNIQUE: Multidetector CT imaging of the abdomen and pelvis was performed following the standard protocol without IV contrast. Oral contrast not administered for this indication. Sagittal and coronal MPR images reconstructed from axial data set. COMPARISON:  08/24/2014 FINDINGS: Lower chest: Mild dependent atelectasis at lung bases greater on RIGHT. Hepatobiliary: 14 x 13 mm RIGHT lobe hepatic cyst image 29 unchanged. Gallbladder liver otherwise normal appearance. Pancreas: Normal appearance Spleen: Normal size. Multiple calcified granulomata. No soft tissue mass. Adrenals/Urinary Tract: Minimal adrenal thickening without discrete mass. Increased atrophy and cortical thinning of RIGHT kidney. Moderate RIGHT hydronephrosis and hydroureter extending to bladder. Mild LEFT hydronephrosis and hydroureter extending to bladder. Single tiny nonobstructing RIGHT renal calculus image 31. No additional urinary tract calcification or renal mass. Marked bladder wall thickening diffusely with extensive perivesicular edema/infiltration. Prostate gland normal size. Stomach/Bowel: Normal appendix. Question mild rectal  wall thickening. Stomach collapsed, wall thickness suboptimally assessed. Remaining bowel loops normal appearance. Vascular/Lymphatic: Atherosclerotic calcifications. Aorta normal caliber. No adenopathy. Reproductive: N/A Other: Small amount of low-attenuation free fluid at the pericolic gutters bilaterally and perihepatic. Edema in the pre vesicular space extends throughout the pelvis to the presacral spaces and perirectal fat. No definite hernias. No free intraperitoneal air. Musculoskeletal: Bones demineralized. Large Schmorl's nodes at T12 and L3, smaller at additional levels. Bones demineralized. No acute osseous findings. IMPRESSION: Marked bladder wall thickening with BILATERAL hydronephrosis and hydroureter with associated extensive perivesicular infiltration most likely representing severe cystitis. Tiny nonobstructing RIGHT renal calculus with increased RIGHT renal atrophy since previous exam. Pelvic extraperitoneal edema and small amount of ascites likely related to bladder process. Question mild rectal wall thickening, recommend correlation with digital rectal exam and follow-up proctoscopy. Electronically Signed   By: Lavonia Dana M.D.   On: 11/11/2015 15:46      Indea Dearman M.D on 11/12/2015 at 1:44 PM  Between 7am to 7pm - Pager - [phone]  After 7pm go to [email or web address] - password [facility]  [facility] -  Office  [phone]

## 2015-11-13 LAB — RENAL FUNCTION PANEL
ANION GAP: 8 (ref 5–15)
Albumin: 1.9 g/dL — ABNORMAL LOW (ref 3.5–5.0)
BUN: 73 mg/dL — ABNORMAL HIGH (ref 6–20)
CALCIUM: 7.8 mg/dL — AB (ref 8.9–10.3)
CHLORIDE: 116 mmol/L — AB (ref 101–111)
CO2: 18 mmol/L — AB (ref 22–32)
Creatinine, Ser: 5.91 mg/dL — ABNORMAL HIGH (ref 0.61–1.24)
GFR, EST AFRICAN AMERICAN: 9 mL/min — AB (ref >60)
GFR, EST NON AFRICAN AMERICAN: 8 mL/min — AB (ref >60)
Glucose, Bld: 101 mg/dL — ABNORMAL HIGH (ref 65–99)
Phosphorus: 4.3 mg/dL (ref 2.5–4.6)
Potassium: 3.5 mmol/L (ref 3.5–5.1)
Sodium: 142 mmol/L (ref 135–145)

## 2015-11-13 LAB — CBC
HEMATOCRIT: 23.4 % — AB (ref 39.0–52.0)
HEMOGLOBIN: 8.2 g/dL — AB (ref 13.0–17.0)
MCH: 26.4 pg (ref 26.0–34.0)
MCHC: 35 g/dL (ref 30.0–36.0)
MCV: 75.2 fL — AB (ref 78.0–100.0)
Platelets: 270 10*3/uL (ref 150–400)
RBC: 3.11 MIL/uL — AB (ref 4.22–5.81)
RDW: 15.6 % — ABNORMAL HIGH (ref 11.5–15.5)
WBC: 11.5 10*3/uL — ABNORMAL HIGH (ref 4.0–10.5)

## 2015-11-13 LAB — SODIUM, URINE, RANDOM: SODIUM UR: 76 mmol/L

## 2015-11-13 NOTE — Progress Notes (Signed)
PROGRESS NOTE                                                                                                                                                                                                             Patient Demographics:    Caleb Hayes, is a 82 y.o. male, DOB - 06-26-1929, [id number]  Admit date - 11/11/2015   Admitting Physician Mauricio Gerome Apley, MD  Outpatient Primary MD for the patient is Pcp Not In System  LOS - 2   Chief Complaint  Patient presents with  . Abdominal Pain  . Hematuria  . Dysuria       Brief Narrative   82 y.o. male with medical history significant of BPH, who presents to the hospital with worsening abdominal pain and difficulty passing urine, CT renal significant for bilateral hydronephrosis, and severe cystitis.  Subjective:    Caleb Hayes today has, No headache, No chest pain, No abdominal pain - No Nausea,  No Cough - SOB.    Assessment  & Plan :    Active Problems:   UTI (urinary tract infection)   AKI (acute kidney injury) ([place])   Acute on chronic CKD stage IV - Patient baseline creatinine has been between 3-5 in the past, creatinine 6.5 on admission - This is most likely related to obstructive uropathy given evidence of bilateral hydronephrosis on CT renal study. - Renal ultrasound today Moderate right hydronephrosis. - Continue with IV fluids and monitor renal function closely, avoid nephrotoxic medication  Obstructive uropathy - This is most likely related to BPH, will start on Flomax, continue with Foley catheter, will need to see urology as outpatient.  UTI/severe infiltrative cystitis - Continue with IV Rocephin, follow on urine cultures - Leukocytosis trending down  Pernicious anemia/B 12 deficiency - Patient is following with Dr. Jana Hakim for B-12 monthly injections  Hyperkalemia - resolved     Code Status : Full  Family Communication  : D/W daughter    Disposition Plan  : pending PT  Consults  :  None  Procedures  : None  DVT Prophylaxis  :   Heparin  Lab Results  Component Value Date   PLT 270 11/13/2015    Antibiotics  :    Anti-infectives    Start     Dose/Rate Route Frequency Ordered Stop   11/12/15  1400  cefTRIAXone (ROCEPHIN) 1 g in dextrose 5 % 50 mL IVPB     1 g 100 mL/hr over 30 Minutes Intravenous Every 24 hours 11/11/15 1757     11/11/15 1400  cefTRIAXone (ROCEPHIN) 1 g in dextrose 5 % 50 mL IVPB     1 g 100 mL/hr over 30 Minutes Intravenous  Once 11/11/15 1357 11/11/15 1515        Objective:   Vitals:   11/12/15 1306 11/12/15 2112 11/13/15 0608 11/13/15 1408  BP: (!) 152/66 137/63 (!) 145/59 (!) 169/68  Pulse: 87 97 74 77  Resp: 20 20 18 18   Temp: 97.6 F (36.4 C) 98.8 F (37.1 C) 98.6 F (37 C) 97.6 F (36.4 C)  TempSrc: Oral Oral Oral Oral  SpO2: 100% 100% 99% 95%  Weight:      Height:        Wt Readings from Last 3 Encounters:  11/11/15 59.1 kg (130 lb 6.4 oz)  07/04/15 67.4 kg (148 lb 8 oz)  07/01/14 74 kg (163 lb 3.2 oz)     Intake/Output Summary (Last 24 hours) at 11/13/15 1451 Last data filed at 11/13/15 1408  Gross per 24 hour  Intake          4430.01 ml  Output             1410 ml  Net          3020.01 ml     Physical Exam  Awake Alert, Oriented X 3, Supple Neck,No JVD,  Symmetrical Chest wall movement, Good air movement bilaterally, CTAB RRR,No Gallops,Rubs or new Murmurs, No Parasternal Heave +ve B.Sounds, Abd Soft, Mild suprapubic tenderness, No rebound - guarding or rigidity. No Cyanosis, Clubbing or edema, No new Rash or bruise      Data Review:    CBC  Recent Labs Lab 11/11/15 1410 11/12/15 0558 11/13/15 0501  WBC 25.5* 20.5* 11.5*  HGB 13.0 10.7* 8.2*  HCT 35.9* 30.1* 23.4*  PLT 293 316 270  MCV 76.2* 76.8* 75.2*  MCH 27.6 27.3 26.4  MCHC 36.2* 35.5 35.0  RDW 15.5 15.4 15.6*  LYMPHSABS 1.6  --   --   MONOABS 0.8  --   --   EOSABS 0.0  --   --    BASOSABS 0.0  --   --     Chemistries   Recent Labs Lab 11/11/15 1410 11/11/15 2102 11/12/15 0558 11/13/15 0501  NA 138 138 141 142  K 5.8* 5.0 4.6 3.5  CL 110 110 112* 116*  CO2 17* 18* 19* 18*  GLUCOSE 183* 208* 146* 101*  BUN 71* 77* 80* 73*  CREATININE 6.40* 6.72* 6.65* 5.91*  CALCIUM 9.1 8.6* 8.9 7.8*  AST 20  --  31  --   ALT 12*  --  18  --   ALKPHOS 86  --  75  --   BILITOT 0.5  --  0.3  --    ------------------------------------------------------------------------------------------------------------------ No results for input(s): CHOL, HDL, LDLCALC, TRIG, CHOLHDL, LDLDIRECT in the last 72 hours.  No results found for: HGBA1C ------------------------------------------------------------------------------------------------------------------ No results for input(s): TSH, T4TOTAL, T3FREE, THYROIDAB in the last 72 hours.  Invalid input(s): FREET3 ------------------------------------------------------------------------------------------------------------------ No results for input(s): VITAMINB12, FOLATE, FERRITIN, TIBC, IRON, RETICCTPCT in the last 72 hours.  Coagulation profile No results for input(s): INR, PROTIME in the last 168 hours.  No results for input(s): DDIMER in the last 72 hours.  Cardiac Enzymes No results for input(s): CKMB, TROPONINI, MYOGLOBIN  in the last 168 hours.  Invalid input(s): CK ------------------------------------------------------------------------------------------------------------------ No results found for: BNP  Inpatient Medications  Scheduled Meds: . cefTRIAXone (ROCEPHIN)  IV  1 g Intravenous Q24H  . feeding supplement (ENSURE ENLIVE)  237 mL Oral BID BM  . heparin  5,000 Units Subcutaneous Q8H  . tamsulosin  0.4 mg Oral QPC supper   Continuous Infusions: . sodium chloride 125 mL/hr at 11/13/15 1322   PRN Meds:.acetaminophen **OR** acetaminophen, ondansetron **OR** ondansetron (ZOFRAN) IV  Micro Results Recent Results  (from the past 240 hour(s))  Urine culture     Status: Abnormal (Preliminary result)   Collection Time: 11/11/15  1:10 PM  Result Value Ref Range Status   Specimen Description URINE, CLEAN CATCH  Final   Special Requests NONE  Final   Culture 80,000 COLONIES/mL ESCHERICHIA COLI (A)  Final   Report Status PENDING  Incomplete    Radiology Reports US Renal  Result Date: 11/12/2015 CLINICAL DATA:  Follow-up hydronephrosis, elevated creatinine EXAM: RENAL / URINARY TRACT ULTRASOUND COMPLETE COMPARISON:  CT abdomen/pelvis dated 11/11/2015 FINDINGS: Right Kidney: Length: 8.6 cm.  Moderate hydronephrosis. Left Kidney: Length: 9.3 cm.  No mass or hydronephrosis. Bladder: Decompressed by indwelling Foley catheter. IMPRESSION: Moderate right hydronephrosis. Bladder decompressed by indwelling Foley catheter. Electronically Signed   By: Julian Hy M.D.   On: 11/12/2015 09:18   Ct Renal Stone Study  Result Date: 11/11/2015 CLINICAL DATA:  Mid abdominal pain, hematuria and painful urination, difficulty urinating since yesterday, history essential benign hypertension, gout, kidney injury EXAM: CT ABDOMEN AND PELVIS WITHOUT CONTRAST TECHNIQUE: Multidetector CT imaging of the abdomen and pelvis was performed following the standard protocol without IV contrast. Oral contrast not administered for this indication. Sagittal and coronal MPR images reconstructed from axial data set. COMPARISON:  08/24/2014 FINDINGS: Lower chest: Mild dependent atelectasis at lung bases greater on RIGHT. Hepatobiliary: 14 x 13 mm RIGHT lobe hepatic cyst image 29 unchanged. Gallbladder liver otherwise normal appearance. Pancreas: Normal appearance Spleen: Normal size. Multiple calcified granulomata. No soft tissue mass. Adrenals/Urinary Tract: Minimal adrenal thickening without discrete mass. Increased atrophy and cortical thinning of RIGHT kidney. Moderate RIGHT hydronephrosis and hydroureter extending to bladder. Mild LEFT  hydronephrosis and hydroureter extending to bladder. Single tiny nonobstructing RIGHT renal calculus image 31. No additional urinary tract calcification or renal mass. Marked bladder wall thickening diffusely with extensive perivesicular edema/infiltration. Prostate gland normal size. Stomach/Bowel: Normal appendix. Question mild rectal wall thickening. Stomach collapsed, wall thickness suboptimally assessed. Remaining bowel loops normal appearance. Vascular/Lymphatic: Atherosclerotic calcifications. Aorta normal caliber. No adenopathy. Reproductive: N/A Other: Small amount of low-attenuation free fluid at the pericolic gutters bilaterally and perihepatic. Edema in the pre vesicular space extends throughout the pelvis to the presacral spaces and perirectal fat. No definite hernias. No free intraperitoneal air. Musculoskeletal: Bones demineralized. Large Schmorl's nodes at T12 and L3, smaller at additional levels. Bones demineralized. No acute osseous findings. IMPRESSION: Marked bladder wall thickening with BILATERAL hydronephrosis and hydroureter with associated extensive perivesicular infiltration most likely representing severe cystitis. Tiny nonobstructing RIGHT renal calculus with increased RIGHT renal atrophy since previous exam. Pelvic extraperitoneal edema and small amount of ascites likely related to bladder process. Question mild rectal wall thickening, recommend correlation with digital rectal exam and follow-up proctoscopy. Electronically Signed   By: Lavonia Dana M.D.   On: 11/11/2015 15:46      ELGERGAWY, DAWOOD M.D on 11/13/2015 at 2:51 PM  Between 7am to 7pm - Pager - [phone]  After 7pm go to  [email or web address] - password [facility]  [facility] -  Office  [phone]
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
## 2015-11-13 NOTE — Progress Notes (Signed)
Initial Nutrition Assessment  INTERVENTION:   Continue Ensure Enlive po BID, each supplement provides 350 kcal and 20 grams of protein Encourage PO intake RD to continue to monitor  NUTRITION DIAGNOSIS:   Inadequate oral intake related to other (see comment) (abdominal pain) as evidenced by meal completion < 50%.  GOAL:   Patient will meet greater than or equal to 90% of their needs  MONITOR:   PO intake, Supplement acceptance, Labs, Weight trends, Skin, I & O's  REASON FOR ASSESSMENT:   Malnutrition Screening Tool    ASSESSMENT:   82 y.o. male with medical history significant of BPH, who presents to the hospital with worsening abdominal pain and difficulty passing urine. For the last week he has been developing abdominal pain in the lower region, dull in nature, moderate in intensity, with no improving or worsening factors, associated with difficulty urinating. He was seen about 4 days ago by his primary care physician and he was diagnosed with urinary tract infection, he has been unable to fill the prescription for the antibiotic therapy.  Patient currently consuming 0-50% of meals of regular diet. Pt had some N/V PTA bu that has since stopped. Pt reports abdominal pain for 1 week PTA. PT drinks Ensure Immune Health at home but is drinking Ensure Enlive with no issues during this admission, will continue supplement. Per weight history, pt has lost 18 lb since 4/25 (12% wt loss x 4 months, significant for time frame). No sign of depletion of muscle mass or body fat noted.  Labs reviewed. Medications reviewed.  Diet Order:  Diet regular Room service appropriate? Yes; Fluid consistency: Thin  Skin:  Reviewed, no issues  Last BM:  9/3  Height:   Ht Readings from Last 1 Encounters:  11/11/15 5\' 6"  (1.676 m)    Weight:   Wt Readings from Last 1 Encounters:  11/11/15 130 lb 6.4 oz (59.1 kg)    Ideal Body Weight:  64.5 kg  BMI:  Body mass index is 21.05  kg/m.  Estimated Nutritional Needs:   Kcal:  1500-1700  Protein:  65-75g  Fluid:  1.7L/day  EDUCATION NEEDS:   No education needs identified at this time  Clayton Bibles, MS, RD, LDN Pager: [phone] After Hours Pager: [phone]

## 2015-11-14 LAB — BASIC METABOLIC PANEL
Anion gap: 6 (ref 5–15)
BUN: 63 mg/dL — ABNORMAL HIGH (ref 6–20)
CHLORIDE: 119 mmol/L — AB (ref 101–111)
CO2: 18 mmol/L — AB (ref 22–32)
Calcium: 7.6 mg/dL — ABNORMAL LOW (ref 8.9–10.3)
Creatinine, Ser: 5.43 mg/dL — ABNORMAL HIGH (ref 0.61–1.24)
GFR calc Af Amer: 10 mL/min — ABNORMAL LOW (ref >60)
GFR calc non Af Amer: 9 mL/min — ABNORMAL LOW (ref >60)
GLUCOSE: 91 mg/dL (ref 65–99)
Potassium: 3.5 mmol/L (ref 3.5–5.1)
SODIUM: 143 mmol/L (ref 135–145)

## 2015-11-14 LAB — URINE CULTURE

## 2015-11-14 LAB — CBC
HCT: 22.6 % — ABNORMAL LOW (ref 39.0–52.0)
Hemoglobin: 7.9 g/dL — ABNORMAL LOW (ref 13.0–17.0)
MCH: 26.9 pg (ref 26.0–34.0)
MCHC: 35 g/dL (ref 30.0–36.0)
MCV: 76.9 fL — AB (ref 78.0–100.0)
PLATELETS: 258 10*3/uL (ref 150–400)
RBC: 2.94 MIL/uL — AB (ref 4.22–5.81)
RDW: 15.6 % — AB (ref 11.5–15.5)
WBC: 8.9 10*3/uL (ref 4.0–10.5)

## 2015-11-14 LAB — IRON AND TIBC
Iron: 42 ug/dL — ABNORMAL LOW (ref 45–182)
Saturation Ratios: 42 % — ABNORMAL HIGH (ref 17.9–39.5)
TIBC: 99 ug/dL — ABNORMAL LOW (ref 250–450)
UIBC: 57 ug/dL

## 2015-11-14 LAB — RETICULOCYTES
RBC.: 3.16 MIL/uL — AB (ref 4.22–5.81)
RETIC COUNT ABSOLUTE: 25.3 10*3/uL (ref 19.0–186.0)
Retic Ct Pct: 0.8 % (ref 0.4–3.1)

## 2015-11-14 LAB — FOLATE: FOLATE: 9.6 ng/mL (ref >5.9)

## 2015-11-14 LAB — FERRITIN: Ferritin: 854 ng/mL — ABNORMAL HIGH (ref 24–336)

## 2015-11-14 LAB — VITAMIN B12: VITAMIN B 12: 2315 pg/mL — AB (ref 180–914)

## 2015-11-14 MED ORDER — DARBEPOETIN ALFA 100 MCG/0.5ML IJ SOSY
100.0000 ug | PREFILLED_SYRINGE | Freq: Once | INTRAMUSCULAR | Status: AC
  Administered 2015-11-14: 100 ug via SUBCUTANEOUS
  Filled 2015-11-14: qty 0.5

## 2015-11-14 NOTE — Progress Notes (Signed)
Physical Therapy Treatment Patient Details Name: Caleb Hayes MRN: [id number] DOB: 09-29-1929 Today's Date: 2015/11/29    History of Present Illness Caleb Hayes is a 82 y.o. male with medical history significant of BPH, who presents to the hospital with worsening abdominal pain and difficulty passing urine    PT Comments    Pt ambulated in hallway and steady while pushing IV pole.  Recommend nursing ambulate with pt as well during acute stay.  Follow Up Recommendations  No PT follow up     Equipment Recommendations  Other (comment) (would benefit from cane, pt declines)    Recommendations for Other Services       Precautions / Restrictions Precautions Precautions: Fall    Mobility  Bed Mobility Overal bed mobility: Needs Assistance Bed Mobility: Supine to Sit     Supine to sit: Supervision     General bed mobility comments: cues for safety  Transfers Overall transfer level: Needs assistance Equipment used: None Transfers: Sit to/from Stand Sit to Stand: Supervision         General transfer comment: supervision for safety  Ambulation/Gait Ambulation/Gait assistance: Min guard;Supervision Ambulation Distance (Feet): 200 Feet Assistive device: None (pushed IV pole) Gait Pattern/deviations: Step-through pattern;Narrow base of support;Drifts right/left Gait velocity: may benefit from cane but declines   General Gait Details: min/guard for safety   Stairs            Wheelchair Mobility    Modified Rankin (Stroke Patients Only)       Balance                                    Cognition Arousal/Alertness: Awake/alert Behavior During Therapy: WFL for tasks assessed/performed Overall Cognitive Status: Within Functional Limits for tasks assessed                      Exercises      General Comments        Pertinent Vitals/Pain Pain Assessment: No/denies pain    Home Living                       Prior Function            PT Goals (current goals can now be found in the care plan section) Progress towards PT goals: Progressing toward goals    Frequency  Min 3X/week    PT Plan Current plan remains appropriate    Co-evaluation             End of Session   Activity Tolerance: Patient tolerated treatment well Patient left: in chair;with call bell/phone within reach;with chair alarm set     Time: 1146-1158 PT Time Calculation (min) (ACUTE ONLY): 12 min  Charges:  $Gait Training: 8-22 mins                    G Codes:      Sierra Bissonette,KATHrine E 29-Nov-2015, 1:31 PM Carmelia Bake, PT, DPT 2015-11-29 Pager: [phone]

## 2015-11-14 NOTE — Care Management Note (Signed)
Case Management Note  Patient Details  Name: Caleb Hayes MRN: [id number] Date of Birth: [birth year]  Subjective/Objective:  82 y/o m admitted w/UTI, AKI. From home.May need HHRN-f/c mgmnt @ d/c. Await Nenzel orders.                  Action/Plan:d/c plan home.   Expected Discharge Date:                  Expected Discharge Plan:  Home/Self Care  In-House Referral:     Discharge planning Services  CM Consult  Post Acute Care Choice:    Choice offered to:     DME Arranged:    DME Agency:     HH Arranged:    HH Agency:     Status of Service:  In process, will continue to follow  If discussed at Long Length of Stay Meetings, dates discussed:    Additional Comments:  Dessa Phi, RN 11/14/2015, 3:31 PM

## 2015-11-14 NOTE — Progress Notes (Signed)
PROGRESS NOTE                                                                                                                                                                                                             Patient Demographics:    Caleb Hayes, is a 82 y.o. male, DOB - 12-04-1929, [id number]  Admit date - 11/11/2015   Admitting Physician Mauricio Gerome Apley, MD  Outpatient Primary MD for the patient is Pcp Not In System  LOS - 3   Chief Complaint  Patient presents with  . Abdominal Pain  . Hematuria  . Dysuria       Brief Narrative   82 y.o. male with medical history significant of BPH, who presents to the hospital with worsening abdominal pain and difficulty passing urine, CT renal significant for bilateral hydronephrosis, and severe cystitis.  Subjective:    Caleb Hayes today has, No headache, No chest pain, No abdominal pain - No Nausea,  No Cough - SOB.    Assessment  & Plan :    Active Problems:   UTI (urinary tract infection)   AKI (acute kidney injury) ([place])   Acute on chronic CKD stage IV - Patient baseline creatinine has been between 3-5 in the past, creatinine 6.5 on admission, Pending down, 5.4 today - This is most likely related to obstructive uropathy given evidence of bilateral hydronephrosis on CT renal study. - Follow Renal ultrasound today Moderate right hydronephrosis. - Continue with IV fluids and monitor renal function closely, avoid nephrotoxic medication.  Obstructive uropathy - CT scan with evidence of bilateral hydronephrosis , reflux uropathy , discussed with urology Dr. Tresa Moore , possibility this is more related to latter dysfunction from severe cystitis , than BPH , plan is to start him on Flomax, continue with Foley catheter , and to follow with his primary urologist Dr. Dannial Monarch in 2 weeks for voiding trial . - Anemia workup significant for low iron and elevated ferritin level, will check  Hemoccult stool, hold subcutaneous heparin for now.   UTI/severe infiltrative cystitis - Continue with IV Rocephin, urine culture growing Escherichia coli pansensitive, he can be transitioned to oral Keflex on discharge and to be treated for total of 10 days. - Leukocytosis trending down  Pernicious anemia/B 12 deficiency - Patient is following with Dr. Jana Hakim for B-12 monthly injections -  Has worsening hemoglobin during hospital stay, 13 on admission today 7.9, think is mainly related to anemia of chronic kidney disease, will give one dose of Aranesp , and  to continue receiving Aranesp and Dr. Nat Math office as discussed with him (patient goes there are monthly for B-12 injections)  Hyperkalemia - resolved     Code Status : Full  Family Communication  : D/W daughter via phone 9/4  Disposition Plan  : Start home in 1-2 days if renal function continues to improve and Hemoccult is negative  Consults  :  None  Procedures  : None  DVT Prophylaxis  :   Scd, will hold subcutaneous heparin given progressive anemia  Lab Results  Component Value Date   PLT 258 11/14/2015    Antibiotics  :    Anti-infectives    Start     Dose/Rate Route Frequency Ordered Stop   11/12/15 1400  cefTRIAXone (ROCEPHIN) 1 g in dextrose 5 % 50 mL IVPB     1 g 100 mL/hr over 30 Minutes Intravenous Every 24 hours 11/11/15 1757     11/11/15 1400  cefTRIAXone (ROCEPHIN) 1 g in dextrose 5 % 50 mL IVPB     1 g 100 mL/hr over 30 Minutes Intravenous  Once 11/11/15 1357 11/11/15 1515        Objective:   Vitals:   11/13/15 1408 11/13/15 2151 11/14/15 0621 11/14/15 1454  BP: (!) 169/68 (!) 152/68 (!) 150/66 (!) 181/66  Pulse: 77 81 72 72  Resp: 18 18 18 18   Temp: 97.6 F (36.4 C) 99.2 F (37.3 C) 98.1 F (36.7 C) 98 F (36.7 C)  TempSrc: Oral Oral Oral Oral  SpO2: 95% 97% 100% 100%  Weight:      Height:        Wt Readings from Last 3 Encounters:  11/11/15 59.1 kg (130 lb 6.4 oz)    07/04/15 67.4 kg (148 lb 8 oz)  07/01/14 74 kg (163 lb 3.2 oz)     Intake/Output Summary (Last 24 hours) at 11/14/15 1502 Last data filed at 11/14/15 0622  Gross per 24 hour  Intake              615 ml  Output             1500 ml  Net             -885 ml     Physical Exam  Awake Alert, Oriented X 3, Supple Neck,No JVD,  Symmetrical Chest wall movement, Good air movement bilaterally, CTAB RRR,No Gallops,Rubs or new Murmurs, No Parasternal Heave +ve B.Sounds, Abd Soft, Mild suprapubic tenderness, No rebound - guarding or rigidity. No Cyanosis, Clubbing or edema, No new Rash or bruise      Data Review:    CBC  Recent Labs Lab 11/11/15 1410 11/12/15 0558 11/13/15 0501 11/14/15 0443  WBC 25.5* 20.5* 11.5* 8.9  HGB 13.0 10.7* 8.2* 7.9*  HCT 35.9* 30.1* 23.4* 22.6*  PLT 293 316 270 258  MCV 76.2* 76.8* 75.2* 76.9*  MCH 27.6 27.3 26.4 26.9  MCHC 36.2* 35.5 35.0 35.0  RDW 15.5 15.4 15.6* 15.6*  LYMPHSABS 1.6  --   --   --   MONOABS 0.8  --   --   --   EOSABS 0.0  --   --   --   BASOSABS 0.0  --   --   --     Chemistries   Recent Labs Lab 11/11/15 1410  11/11/15 2102 11/12/15 0558 11/13/15 0501 11/14/15 0443  NA 138 138 141 142 143  K 5.8* 5.0 4.6 3.5 3.5  CL 110 110 112* 116* 119*  CO2 17* 18* 19* 18* 18*  GLUCOSE 183* 208* 146* 101* 91  BUN 71* 77* 80* 73* 63*  CREATININE 6.40* 6.72* 6.65* 5.91* 5.43*  CALCIUM 9.1 8.6* 8.9 7.8* 7.6*  AST 20  --  31  --   --   ALT 12*  --  18  --   --   ALKPHOS 86  --  75  --   --   BILITOT 0.5  --  0.3  --   --    ------------------------------------------------------------------------------------------------------------------ No results for input(s): CHOL, HDL, LDLCALC, TRIG, CHOLHDL, LDLDIRECT in the last 72 hours.  No results found for: HGBA1C ------------------------------------------------------------------------------------------------------------------ No results for input(s): TSH, T4TOTAL, T3FREE, THYROIDAB  in the last 72 hours.  Invalid input(s): FREET3 ------------------------------------------------------------------------------------------------------------------  Recent Labs  11/14/15 0905  VITAMINB12 2,315*  FOLATE 9.6  FERRITIN 854*  TIBC 99*  IRON 42*  RETICCTPCT 0.8    Coagulation profile No results for input(s): INR, PROTIME in the last 168 hours.  No results for input(s): DDIMER in the last 72 hours.  Cardiac Enzymes No results for input(s): CKMB, TROPONINI, MYOGLOBIN in the last 168 hours.  Invalid input(s): CK ------------------------------------------------------------------------------------------------------------------ No results found for: BNP  Inpatient Medications  Scheduled Meds: . cefTRIAXone (ROCEPHIN)  IV  1 g Intravenous Q24H  . feeding supplement (ENSURE ENLIVE)  237 mL Oral BID BM  . tamsulosin  0.4 mg Oral QPC supper   Continuous Infusions: . sodium chloride 125 mL/hr at 11/14/15 0534   PRN Meds:.acetaminophen **OR** acetaminophen, ondansetron **OR** ondansetron (ZOFRAN) IV  Micro Results Recent Results (from the past 240 hour(s))  Urine culture     Status: Abnormal   Collection Time: 11/11/15  1:10 PM  Result Value Ref Range Status   Specimen Description URINE, CLEAN CATCH  Final   Special Requests NONE  Final   Culture 80,000 COLONIES/mL ESCHERICHIA COLI (A)  Final   Report Status 11/14/2015 FINAL  Final   Organism ID, Bacteria ESCHERICHIA COLI (A)  Final      Susceptibility   Escherichia coli - MIC*    AMPICILLIN <=2 SENSITIVE Sensitive     CEFAZOLIN <=4 SENSITIVE Sensitive     CEFTRIAXONE <=1 SENSITIVE Sensitive     CIPROFLOXACIN <=0.25 SENSITIVE Sensitive     GENTAMICIN <=1 SENSITIVE Sensitive     IMIPENEM <=0.25 SENSITIVE Sensitive     NITROFURANTOIN <=16 SENSITIVE Sensitive     TRIMETH/SULFA <=20 SENSITIVE Sensitive     AMPICILLIN/SULBACTAM <=2 SENSITIVE Sensitive     PIP/TAZO <=4 SENSITIVE Sensitive     Extended ESBL  NEGATIVE Sensitive     * 80,000 COLONIES/mL ESCHERICHIA COLI    Radiology Reports US Renal  Result Date: 11/12/2015 CLINICAL DATA:  Follow-up hydronephrosis, elevated creatinine EXAM: RENAL / URINARY TRACT ULTRASOUND COMPLETE COMPARISON:  CT abdomen/pelvis dated 11/11/2015 FINDINGS: Right Kidney: Length: 8.6 cm.  Moderate hydronephrosis. Left Kidney: Length: 9.3 cm.  No mass or hydronephrosis. Bladder: Decompressed by indwelling Foley catheter. IMPRESSION: Moderate right hydronephrosis. Bladder decompressed by indwelling Foley catheter. Electronically Signed   By: Julian Hy M.D.   On: 11/12/2015 09:18   Ct Renal Stone Study  Result Date: 11/11/2015 CLINICAL DATA:  Mid abdominal pain, hematuria and painful urination, difficulty urinating since yesterday, history essential benign hypertension, gout, kidney injury EXAM: CT ABDOMEN AND PELVIS WITHOUT CONTRAST  TECHNIQUE: Multidetector CT imaging of the abdomen and pelvis was performed following the standard protocol without IV contrast. Oral contrast not administered for this indication. Sagittal and coronal MPR images reconstructed from axial data set. COMPARISON:  08/24/2014 FINDINGS: Lower chest: Mild dependent atelectasis at lung bases greater on RIGHT. Hepatobiliary: 14 x 13 mm RIGHT lobe hepatic cyst image 29 unchanged. Gallbladder liver otherwise normal appearance. Pancreas: Normal appearance Spleen: Normal size. Multiple calcified granulomata. No soft tissue mass. Adrenals/Urinary Tract: Minimal adrenal thickening without discrete mass. Increased atrophy and cortical thinning of RIGHT kidney. Moderate RIGHT hydronephrosis and hydroureter extending to bladder. Mild LEFT hydronephrosis and hydroureter extending to bladder. Single tiny nonobstructing RIGHT renal calculus image 31. No additional urinary tract calcification or renal mass. Marked bladder wall thickening diffusely with extensive perivesicular edema/infiltration. Prostate gland normal  size. Stomach/Bowel: Normal appendix. Question mild rectal wall thickening. Stomach collapsed, wall thickness suboptimally assessed. Remaining bowel loops normal appearance. Vascular/Lymphatic: Atherosclerotic calcifications. Aorta normal caliber. No adenopathy. Reproductive: N/A Other: Small amount of low-attenuation free fluid at the pericolic gutters bilaterally and perihepatic. Edema in the pre vesicular space extends throughout the pelvis to the presacral spaces and perirectal fat. No definite hernias. No free intraperitoneal air. Musculoskeletal: Bones demineralized. Large Schmorl's nodes at T12 and L3, smaller at additional levels. Bones demineralized. No acute osseous findings. IMPRESSION: Marked bladder wall thickening with BILATERAL hydronephrosis and hydroureter with associated extensive perivesicular infiltration most likely representing severe cystitis. Tiny nonobstructing RIGHT renal calculus with increased RIGHT renal atrophy since previous exam. Pelvic extraperitoneal edema and small amount of ascites likely related to bladder process. Question mild rectal wall thickening, recommend correlation with digital rectal exam and follow-up proctoscopy. Electronically Signed   By: Lavonia Dana M.D.   On: 11/11/2015 15:46      Shanoah Asbill M.D on 11/14/2015 at 3:02 PM  Between 7am to 7pm - Pager - [phone]  After 7pm go to [email or web address] - password [facility]  [facility] -  Office  [phone]

## 2015-11-15 ENCOUNTER — Other Ambulatory Visit: Payer: Self-pay | Admitting: Oncology

## 2015-11-15 DIAGNOSIS — N179 Acute kidney failure, unspecified: Secondary | ICD-10-CM

## 2015-11-15 DIAGNOSIS — N3001 Acute cystitis with hematuria: Secondary | ICD-10-CM

## 2015-11-15 LAB — CBC
HCT: 23.3 % — ABNORMAL LOW (ref 39.0–52.0)
Hemoglobin: 8 g/dL — ABNORMAL LOW (ref 13.0–17.0)
MCH: 26.3 pg (ref 26.0–34.0)
MCHC: 34.3 g/dL (ref 30.0–36.0)
MCV: 76.6 fL — AB (ref 78.0–100.0)
PLATELETS: 266 10*3/uL (ref 150–400)
RBC: 3.04 MIL/uL — ABNORMAL LOW (ref 4.22–5.81)
RDW: 15.8 % — AB (ref 11.5–15.5)
WBC: 9.8 10*3/uL (ref 4.0–10.5)

## 2015-11-15 LAB — BASIC METABOLIC PANEL
Anion gap: 6 (ref 5–15)
BUN: 57 mg/dL — AB (ref 6–20)
CALCIUM: 7.6 mg/dL — AB (ref 8.9–10.3)
CO2: 18 mmol/L — ABNORMAL LOW (ref 22–32)
CREATININE: 4.87 mg/dL — AB (ref 0.61–1.24)
Chloride: 118 mmol/L — ABNORMAL HIGH (ref 101–111)
GFR calc Af Amer: 11 mL/min — ABNORMAL LOW (ref >60)
GFR, EST NON AFRICAN AMERICAN: 10 mL/min — AB (ref >60)
GLUCOSE: 100 mg/dL — AB (ref 65–99)
Potassium: 3.5 mmol/L (ref 3.5–5.1)
Sodium: 142 mmol/L (ref 135–145)

## 2015-11-15 MED ORDER — CEPHALEXIN 500 MG PO CAPS
500.0000 mg | ORAL_CAPSULE | Freq: Two times a day (BID) | ORAL | 0 refills | Status: DC
Start: 2015-11-15 — End: 2016-07-03

## 2015-11-15 MED ORDER — CEPHALEXIN 500 MG PO CAPS: 500.0000 mg | ORAL_CAPSULE | Freq: Four times a day (QID) | ORAL | 0 refills | Status: DC

## 2015-11-15 MED ORDER — TAMSULOSIN HCL 0.4 MG PO CAPS: 0.4000 mg | ORAL_CAPSULE | Freq: Every day | ORAL | 0 refills | Status: DC

## 2015-11-15 NOTE — Progress Notes (Signed)
Taking over care of patient, agree with previous RN assessment. Patient denies any needs at this time. Will continue to monitor.

## 2015-11-15 NOTE — Care Management Important Message (Signed)
Important Message  Patient Details  Name: Caleb Hayes MRN: [id number] Date of Birth: 12-Apr-1929   Medicare Important Message Given:  Yes    Camillo Flaming 11/15/2015, 11:52 AMImportant Message  Patient Details  Name: Caleb Hayes MRN: [id number] Date of Birth: [birth year]   Medicare Important Message Given:  Yes    Camillo Flaming 11/15/2015, 11:51 AM

## 2015-11-15 NOTE — Progress Notes (Signed)
I discussed Caleb Hayes's situation with the hospitalist during his recent admission. He has an element of renal failure and anemia in addition to B12 deficiency which we are correcting. It would be very easy for [place] to add Aranesp to help correct that and I have done that. His first treatment will be on September 19.  He will continue those every 4 weeks in conjunction with his B12 shots.

## 2015-11-15 NOTE — Discharge Summary (Signed)
Physician Discharge Summary  Caleb Hayes [id number] DOB: 1929/04/14 DOA: 11/11/2015  PCP: Pcp Not In System  Admit date: 11/11/2015 Discharge date: 11/15/2015  Admitted From: home Disposition:  home     Discharge Condition:  stable   CODE STATUS:  Full code   Diet recommendation:  Heart healthy Consultations:  none    Discharge Diagnoses:  Active Problems:   UTI (urinary tract infection)   AKI (acute kidney injury) ([place])    Subjective: No abdominal pain, fever chills, nausea or vomiting.   Brief Summary: 82 y.o.malewith medical history significant of BPH, who presents to the hospital with worsening abdominal pain and difficulty passing urine, CT renal significant for bilateral hydronephrosis, and severe cystitis. He was seen about 4 days prior to admission by his primary care physician and he was diagnosed with urinary tract infection- he has been unable to fill the prescription for the antibiotic therapy. He was diagnosed in the past with enlarged prostate and apparently he was Flomax, but he has stop taking it.  Hospital Course:  Acute on chronic CKD stage IV due to obstructive Uropathy - Patient baseline creatinine has been between 3-5 in the past  - Cr on admission 6.40 peaking at 6.72 and subsequently has been trending down- 4.87 today - Most likely related to obstructive uropathy given evidence of bilateral hydronephrosis and marked bladder wall thickening (likley representing Cystitis) noted on CT renal study. - cause of obstruction likely severe cystitis- Foley placed - less likely BPH however, have resumed his Flomax - will go home with Foley catheter - plan is for him to f/u with his primary urologist, Dr. West Bali, in 2 weeks for voiding trial .   UTI/severe infiltrative cystitis - Treated with IV Rocephin- urine culture growing Escherichia coli - pansensitive - has been transitioned to oral Keflex to be treated for total of 10 days. - WBC count 25 on  admission- now down to normal at 8  Pernicious anemia/B 12 deficiency - Patient is following with Dr. Jana Hakim for B-12 monthly injections - Hb is at baseline    Hyperkalemia - resolved    Discharge Instructions  Discharge Instructions    Discharge instructions    Complete by:  As directed   Renal heart healthy diet   Increase activity slowly    Complete by:  As directed       Medication List    TAKE these medications   cephALEXin 500 MG capsule Commonly known as:  KEFLEX Take 1 capsule (500 mg total) by mouth 2 (two) times daily.   COLCRYS 0.6 MG tablet Generic drug:  colchicine TAKE 1/2 TABLET DAILY   feeding supplement Liqd Take 237 mLs by mouth daily as needed (NUTRITIONAL).   ferrous sulfate 325 (65 FE) MG tablet Take 1 tablet (325 mg total) by mouth 3 (three) times daily with meals.   multivitamin with minerals Tabs tablet Take 1 tablet by mouth daily.   pantoprazole 40 MG tablet Commonly known as:  PROTONIX Take 1 tablet (40 mg total) by mouth daily at 12 noon.   senna-docusate 8.6-50 MG tablet Commonly known as:  Senokot-S Take 2 tablets by mouth 2 (two) times daily.   sodium bicarbonate 650 MG tablet Take 1 tablet (650 mg total) by mouth 2 (two) times daily.   tamsulosin 0.4 MG Caps capsule Commonly known as:  FLOMAX Take 1 capsule (0.4 mg total) by mouth daily after supper.   VITAMIN B-12 IJ Inject 1 mL as directed every 30 (thirty)  days.       No Known Allergies   Procedures/Studies:   US Renal  Result Date: 11/12/2015 CLINICAL DATA:  Follow-up hydronephrosis, elevated creatinine EXAM: RENAL / URINARY TRACT ULTRASOUND COMPLETE COMPARISON:  CT abdomen/pelvis dated 11/11/2015 FINDINGS: Right Kidney: Length: 8.6 cm.  Moderate hydronephrosis. Left Kidney: Length: 9.3 cm.  No mass or hydronephrosis. Bladder: Decompressed by indwelling Foley catheter. IMPRESSION: Moderate right hydronephrosis. Bladder decompressed by indwelling Foley  catheter. Electronically Signed   By: Julian Hy M.D.   On: 11/12/2015 09:18   Ct Renal Stone Study  Result Date: 11/11/2015 CLINICAL DATA:  Mid abdominal pain, hematuria and painful urination, difficulty urinating since yesterday, history essential benign hypertension, gout, kidney injury EXAM: CT ABDOMEN AND PELVIS WITHOUT CONTRAST TECHNIQUE: Multidetector CT imaging of the abdomen and pelvis was performed following the standard protocol without IV contrast. Oral contrast not administered for this indication. Sagittal and coronal MPR images reconstructed from axial data set. COMPARISON:  08/24/2014 FINDINGS: Lower chest: Mild dependent atelectasis at lung bases greater on RIGHT. Hepatobiliary: 14 x 13 mm RIGHT lobe hepatic cyst image 29 unchanged. Gallbladder liver otherwise normal appearance. Pancreas: Normal appearance Spleen: Normal size. Multiple calcified granulomata. No soft tissue mass. Adrenals/Urinary Tract: Minimal adrenal thickening without discrete mass. Increased atrophy and cortical thinning of RIGHT kidney. Moderate RIGHT hydronephrosis and hydroureter extending to bladder. Mild LEFT hydronephrosis and hydroureter extending to bladder. Single tiny nonobstructing RIGHT renal calculus image 31. No additional urinary tract calcification or renal mass. Marked bladder wall thickening diffusely with extensive perivesicular edema/infiltration. Prostate gland normal size. Stomach/Bowel: Normal appendix. Question mild rectal wall thickening. Stomach collapsed, wall thickness suboptimally assessed. Remaining bowel loops normal appearance. Vascular/Lymphatic: Atherosclerotic calcifications. Aorta normal caliber. No adenopathy. Reproductive: N/A Other: Small amount of low-attenuation free fluid at the pericolic gutters bilaterally and perihepatic. Edema in the pre vesicular space extends throughout the pelvis to the presacral spaces and perirectal fat. No definite hernias. No free intraperitoneal air.  Musculoskeletal: Bones demineralized. Large Schmorl's nodes at T12 and L3, smaller at additional levels. Bones demineralized. No acute osseous findings. IMPRESSION: Marked bladder wall thickening with BILATERAL hydronephrosis and hydroureter with associated extensive perivesicular infiltration most likely representing severe cystitis. Tiny nonobstructing RIGHT renal calculus with increased RIGHT renal atrophy since previous exam. Pelvic extraperitoneal edema and small amount of ascites likely related to bladder process. Question mild rectal wall thickening, recommend correlation with digital rectal exam and follow-up proctoscopy. Electronically Signed   By: Lavonia Dana M.D.   On: 11/11/2015 15:46       Discharge Exam: Vitals:   11/14/15 2024 11/15/15 0446  BP:  (!) 178/69  Pulse:  75  Resp: 18 18  Temp:  98.6 F (37 C)   Vitals:   11/14/15 0621 11/14/15 1454 11/14/15 2024 11/15/15 0446  BP: (!) 150/66 (!) 181/66 (!) 174/69 (!) 178/69  Pulse: 72 72 81 75  Resp: 18 18 18 18   Temp: 98.1 F (36.7 C) 98 F (36.7 C) 98.7 F (37.1 C) 98.6 F (37 C)  TempSrc: Oral Oral Oral Oral  SpO2: 100% 100% 98% 97%  Weight:      Height:        General: Pt is alert, awake, not in acute distress Cardiovascular: RRR, S1/S2 +, no rubs, no gallops Respiratory: CTA bilaterally, no wheezing, no rhonchi Abdominal: Soft, NT, ND, bowel sounds + Extremities: no edema, no cyanosis    The results of significant diagnostics from this hospitalization (including imaging, microbiology, ancillary and laboratory)  are listed below for reference.     Microbiology: Recent Results (from the past 240 hour(s))  Urine culture     Status: Abnormal   Collection Time: 11/11/15  1:10 PM  Result Value Ref Range Status   Specimen Description URINE, CLEAN CATCH  Final   Special Requests NONE  Final   Culture 80,000 COLONIES/mL ESCHERICHIA COLI (A)  Final   Report Status 11/14/2015 FINAL  Final   Organism ID, Bacteria  ESCHERICHIA COLI (A)  Final      Susceptibility   Escherichia coli - MIC*    AMPICILLIN <=2 SENSITIVE Sensitive     CEFAZOLIN <=4 SENSITIVE Sensitive     CEFTRIAXONE <=1 SENSITIVE Sensitive     CIPROFLOXACIN <=0.25 SENSITIVE Sensitive     GENTAMICIN <=1 SENSITIVE Sensitive     IMIPENEM <=0.25 SENSITIVE Sensitive     NITROFURANTOIN <=16 SENSITIVE Sensitive     TRIMETH/SULFA <=20 SENSITIVE Sensitive     AMPICILLIN/SULBACTAM <=2 SENSITIVE Sensitive     PIP/TAZO <=4 SENSITIVE Sensitive     Extended ESBL NEGATIVE Sensitive     * 80,000 COLONIES/mL ESCHERICHIA COLI     Labs: BNP (last 3 results) No results for input(s): BNP in the last 8760 hours. Basic Metabolic Panel:  Recent Labs Lab 11/11/15 2102 11/12/15 0558 11/13/15 0501 11/14/15 0443 11/15/15 0545  NA 138 141 142 143 142  K 5.0 4.6 3.5 3.5 3.5  CL 110 112* 116* 119* 118*  CO2 18* 19* 18* 18* 18*  GLUCOSE 208* 146* 101* 91 100*  BUN 77* 80* 73* 63* 57*  CREATININE 6.72* 6.65* 5.91* 5.43* 4.87*  CALCIUM 8.6* 8.9 7.8* 7.6* 7.6*  PHOS  --   --  4.3  --   --    Liver Function Tests:  Recent Labs Lab 11/11/15 1410 11/12/15 0558 11/13/15 0501  AST 20 31  --   ALT 12* 18  --   ALKPHOS 86 75  --   BILITOT 0.5 0.3  --   PROT 7.5 6.8  --   ALBUMIN 2.8* 2.5* 1.9*   No results for input(s): LIPASE, AMYLASE in the last 168 hours. No results for input(s): AMMONIA in the last 168 hours. CBC:  Recent Labs Lab 11/11/15 1410 11/12/15 0558 11/13/15 0501 11/14/15 0443 11/15/15 0545  WBC 25.5* 20.5* 11.5* 8.9 9.8  NEUTROABS 23.0*  --   --   --   --   HGB 13.0 10.7* 8.2* 7.9* 8.0*  HCT 35.9* 30.1* 23.4* 22.6* 23.3*  MCV 76.2* 76.8* 75.2* 76.9* 76.6*  PLT 293 316 270 258 266   Cardiac Enzymes: No results for input(s): CKTOTAL, CKMB, CKMBINDEX, TROPONINI in the last 168 hours. BNP: Invalid input(s): POCBNP CBG: No results for input(s): GLUCAP in the last 168 hours. D-Dimer No results for input(s): DDIMER in the  last 72 hours. Hgb A1c No results for input(s): HGBA1C in the last 72 hours. Lipid Profile No results for input(s): CHOL, HDL, LDLCALC, TRIG, CHOLHDL, LDLDIRECT in the last 72 hours. Thyroid function studies No results for input(s): TSH, T4TOTAL, T3FREE, THYROIDAB in the last 72 hours.  Invalid input(s): FREET3 Anemia work up  Recent Labs  11/14/15 0905  VITAMINB12 2,315*  FOLATE 9.6  FERRITIN 854*  TIBC 99*  IRON 42*  RETICCTPCT 0.8   Urinalysis    Component Value Date/Time   COLORURINE RED (A) 11/11/2015 1310   APPEARANCEUR TURBID (A) 11/11/2015 1310   LABSPEC 1.028 11/11/2015 1310   PHURINE 7.5 11/11/2015 1310  GLUCOSEU 100 (A) 11/11/2015 1310   HGBUR LARGE (A) 11/11/2015 1310   BILIRUBINUR NEGATIVE 11/11/2015 1310   KETONESUR NEGATIVE 11/11/2015 1310   PROTEINUR >300 (A) 11/11/2015 1310   UROBILINOGEN 0.2 08/05/2014 1446   NITRITE NEGATIVE 11/11/2015 1310   LEUKOCYTESUR MODERATE (A) 11/11/2015 1310   Sepsis Labs Invalid input(s): PROCALCITONIN,  WBC,  LACTICIDVEN Microbiology Recent Results (from the past 240 hour(s))  Urine culture     Status: Abnormal   Collection Time: 11/11/15  1:10 PM  Result Value Ref Range Status   Specimen Description URINE, CLEAN CATCH  Final   Special Requests NONE  Final   Culture 80,000 COLONIES/mL ESCHERICHIA COLI (A)  Final   Report Status 11/14/2015 FINAL  Final   Organism ID, Bacteria ESCHERICHIA COLI (A)  Final      Susceptibility   Escherichia coli - MIC*    AMPICILLIN <=2 SENSITIVE Sensitive     CEFAZOLIN <=4 SENSITIVE Sensitive     CEFTRIAXONE <=1 SENSITIVE Sensitive     CIPROFLOXACIN <=0.25 SENSITIVE Sensitive     GENTAMICIN <=1 SENSITIVE Sensitive     IMIPENEM <=0.25 SENSITIVE Sensitive     NITROFURANTOIN <=16 SENSITIVE Sensitive     TRIMETH/SULFA <=20 SENSITIVE Sensitive     AMPICILLIN/SULBACTAM <=2 SENSITIVE Sensitive     PIP/TAZO <=4 SENSITIVE Sensitive     Extended ESBL NEGATIVE Sensitive     * 80,000  COLONIES/mL ESCHERICHIA COLI     Time coordinating discharge: Over 30 minutes  SIGNED:   Debbe Odea, MD  [facility] 11/15/2015, 12:24 PM Pager   If 7PM-7AM, please contact night-coverage [email or web address] Password [email or web address]

## 2015-11-15 NOTE — Discharge Summary (Signed)
Physician Discharge Summary  Caleb Hayes [id number] DOB: 03-20-1929 DOA: 11/11/2015  PCP: Pcp Not In System  Admit date: 11/11/2015 Discharge date: 11/27/2015  Admitted From: home  Disposition:  home   Recommendations for Outpatient Follow-up:  1. Voiding trial in 2 wks   Discharge Condition:  stable   CODE STATUS:  Full code   Diet recommendation:  Heart healthy Consultations:      Discharge Diagnoses:  Principal Problem:   AKI (acute kidney injury) ([place]) Active Problems:   Urinary retention   UTI (urinary tract infection)   BPH (benign prostatic hyperplasia)   CKD (chronic kidney disease) stage 4, GFR 15-29 ml/min ([facility])    Brief Summary: 82 y.o.malewith medical history significant of BPH, who presents to the hospital with worsening abdominal pain and difficulty passing urine, CT renal significant for bilateral hydronephrosis, and severe cystitis. He was seen about 4 days prior to admission by his primary care physician and he was diagnosed with urinary tract infection- he has been unable to fill the prescription for the antibiotic therapy. He was diagnosed in the past with enlarged prostate and apparently he was previously on Flomax but he has stop taking it.  Hospital Course:  Acute on chronic CKD stage IV due to obstructive Uropathy - Patient baseline creatinine has been between 3-5 in the past  - Cr on admission 6.40 peaking at 6.72 and subsequently has been trending down- 4.87 today - Most likely related to obstructive uropathy given evidence of bilateral hydronephrosis and marked bladder wall thickening (likley representing Cystitis) noted on CT renal study. - cause of obstruction likely severe cystitis- Foley placed - less likely BPH however, have resumed his Flomax - will go home with Foley catheter - plan is for him to f/u with his primary urologist, Dr. West Bali, in 2 weeks for voiding trial .   UTI/severe infiltrative cystitis - Treated with IV  Rocephin- urine culture growing Escherichia coli - pansensitive - has been transitioned to oral Keflex to be treated for total of 10 days. - WBC count 25 on admission- now down to normal at 8  Anemia /B 12 deficiency/ anemia secondary to CKD - Patient is following with Dr. Jana Hakim for B-12 monthly injections - given a dose of Aranesp on 9/5  - Hb is at baseline   Hyperkalemia - resolved   Discharge Instructions  Discharge Instructions    Discharge instructions    Complete by:  As directed    Renal heart healthy diet   Increase activity slowly    Complete by:  As directed        Medication List    TAKE these medications   cephALEXin 500 MG capsule Commonly known as:  KEFLEX Take 1 capsule (500 mg total) by mouth 2 (two) times daily.   COLCRYS 0.6 MG tablet Generic drug:  colchicine TAKE 1/2 TABLET DAILY   feeding supplement Liqd Take 237 mLs by mouth daily as needed (NUTRITIONAL).   ferrous sulfate 325 (65 FE) MG tablet Take 1 tablet (325 mg total) by mouth 3 (three) times daily with meals.   multivitamin with minerals Tabs tablet Take 1 tablet by mouth daily.   pantoprazole 40 MG tablet Commonly known as:  PROTONIX Take 1 tablet (40 mg total) by mouth daily at 12 noon.   senna-docusate 8.6-50 MG tablet Commonly known as:  Senokot-S Take 2 tablets by mouth 2 (two) times daily.   sodium bicarbonate 650 MG tablet Take 1 tablet (650 mg total) by mouth  2 (two) times daily.   tamsulosin 0.4 MG Caps capsule Commonly known as:  FLOMAX Take 1 capsule (0.4 mg total) by mouth daily after supper.   VITAMIN B-12 IJ Inject 1 mL as directed every 30 (thirty) days.      Follow-up Information    Karen Kays, NP. Go on 11/29/2015.   Specialty:  Nurse Practitioner Why:  At 9:00am for post hospitalization follow-up Contact information: [address] [place]  [address] [phone]          No Known  Allergies   Procedures/Studies:   US Renal  Result Date: 11/12/2015 CLINICAL DATA:  Follow-up hydronephrosis, elevated creatinine EXAM: RENAL / URINARY TRACT ULTRASOUND COMPLETE COMPARISON:  CT abdomen/pelvis dated 11/11/2015 FINDINGS: Right Kidney: Length: 8.6 cm.  Moderate hydronephrosis. Left Kidney: Length: 9.3 cm.  No mass or hydronephrosis. Bladder: Decompressed by indwelling Foley catheter. IMPRESSION: Moderate right hydronephrosis. Bladder decompressed by indwelling Foley catheter. Electronically Signed   By: Julian Hy M.D.   On: 11/12/2015 09:18   Ct Renal Stone Study  Result Date: 11/11/2015 CLINICAL DATA:  Mid abdominal pain, hematuria and painful urination, difficulty urinating since yesterday, history essential benign hypertension, gout, kidney injury EXAM: CT ABDOMEN AND PELVIS WITHOUT CONTRAST TECHNIQUE: Multidetector CT imaging of the abdomen and pelvis was performed following the standard protocol without IV contrast. Oral contrast not administered for this indication. Sagittal and coronal MPR images reconstructed from axial data set. COMPARISON:  08/24/2014 FINDINGS: Lower chest: Mild dependent atelectasis at lung bases greater on RIGHT. Hepatobiliary: 14 x 13 mm RIGHT lobe hepatic cyst image 29 unchanged. Gallbladder liver otherwise normal appearance. Pancreas: Normal appearance Spleen: Normal size. Multiple calcified granulomata. No soft tissue mass. Adrenals/Urinary Tract: Minimal adrenal thickening without discrete mass. Increased atrophy and cortical thinning of RIGHT kidney. Moderate RIGHT hydronephrosis and hydroureter extending to bladder. Mild LEFT hydronephrosis and hydroureter extending to bladder. Single tiny nonobstructing RIGHT renal calculus image 31. No additional urinary tract calcification or renal mass. Marked bladder wall thickening diffusely with extensive perivesicular edema/infiltration. Prostate gland normal size. Stomach/Bowel: Normal appendix. Question mild  rectal wall thickening. Stomach collapsed, wall thickness suboptimally assessed. Remaining bowel loops normal appearance. Vascular/Lymphatic: Atherosclerotic calcifications. Aorta normal caliber. No adenopathy. Reproductive: N/A Other: Small amount of low-attenuation free fluid at the pericolic gutters bilaterally and perihepatic. Edema in the pre vesicular space extends throughout the pelvis to the presacral spaces and perirectal fat. No definite hernias. No free intraperitoneal air. Musculoskeletal: Bones demineralized. Large Schmorl's nodes at T12 and L3, smaller at additional levels. Bones demineralized. No acute osseous findings. IMPRESSION: Marked bladder wall thickening with BILATERAL hydronephrosis and hydroureter with associated extensive perivesicular infiltration most likely representing severe cystitis. Tiny nonobstructing RIGHT renal calculus with increased RIGHT renal atrophy since previous exam. Pelvic extraperitoneal edema and small amount of ascites likely related to bladder process. Question mild rectal wall thickening, recommend correlation with digital rectal exam and follow-up proctoscopy. Electronically Signed   By: Lavonia Dana M.D.   On: 11/11/2015 15:46       Discharge Exam: Vitals:   11/14/15 2024 11/15/15 0446  BP:  (!) 178/69  Pulse:  75  Resp: 18 18  Temp:  98.6 F (37 C)   Vitals:   11/14/15 0621 11/14/15 1454 11/14/15 2024 11/15/15 0446  BP: (!) 150/66 (!) 181/66 (!) 174/69 (!) 178/69  Pulse: 72 72 81 75  Resp: 18 18 18 18   Temp: 98.1 F (36.7 C) 98 F (36.7 C) 98.7 F (  37.1 C) 98.6 F (37 C)  TempSrc: Oral Oral Oral Oral  SpO2: 100% 100% 98% 97%  Weight:      Height:        General: Pt is alert, awake, not in acute distress Cardiovascular: RRR, S1/S2 +, no rubs, no gallops Respiratory: CTA bilaterally, no wheezing, no rhonchi Abdominal: Soft, NT, ND, bowel sounds + Extremities: no edema, no cyanosis    The results of significant diagnostics from  this hospitalization (including imaging, microbiology, ancillary and laboratory) are listed below for reference.     Microbiology: No results found for this or any previous visit (from the past 240 hour(s)).   Labs: BNP (last 3 results) No results for input(s): BNP in the last 8760 hours. Basic Metabolic Panel: No results for input(s): NA, K, CL, CO2, GLUCOSE, BUN, CREATININE, CALCIUM, MG, PHOS in the last 168 hours. Liver Function Tests: No results for input(s): AST, ALT, ALKPHOS, BILITOT, PROT, ALBUMIN in the last 168 hours. No results for input(s): LIPASE, AMYLASE in the last 168 hours. No results for input(s): AMMONIA in the last 168 hours. CBC: No results for input(s): WBC, NEUTROABS, HGB, HCT, MCV, PLT in the last 168 hours. Cardiac Enzymes: No results for input(s): CKTOTAL, CKMB, CKMBINDEX, TROPONINI in the last 168 hours. BNP: Invalid input(s): POCBNP CBG: No results for input(s): GLUCAP in the last 168 hours. D-Dimer No results for input(s): DDIMER in the last 72 hours. Hgb A1c No results for input(s): HGBA1C in the last 72 hours. Lipid Profile No results for input(s): CHOL, HDL, LDLCALC, TRIG, CHOLHDL, LDLDIRECT in the last 72 hours. Thyroid function studies No results for input(s): TSH, T4TOTAL, T3FREE, THYROIDAB in the last 72 hours.  Invalid input(s): FREET3 Anemia work up No results for input(s): VITAMINB12, FOLATE, FERRITIN, TIBC, IRON, RETICCTPCT in the last 72 hours. Urinalysis    Component Value Date/Time   COLORURINE RED (A) 11/11/2015 1310   APPEARANCEUR TURBID (A) 11/11/2015 1310   LABSPEC 1.028 11/11/2015 1310   PHURINE 7.5 11/11/2015 1310   GLUCOSEU 100 (A) 11/11/2015 1310   HGBUR LARGE (A) 11/11/2015 1310   BILIRUBINUR NEGATIVE 11/11/2015 1310   KETONESUR NEGATIVE 11/11/2015 1310   PROTEINUR >300 (A) 11/11/2015 1310   UROBILINOGEN 0.2 08/05/2014 1446   NITRITE NEGATIVE 11/11/2015 1310   LEUKOCYTESUR MODERATE (A) 11/11/2015 1310   Sepsis  Labs Invalid input(s): PROCALCITONIN,  WBC,  LACTICIDVEN Microbiology No results found for this or any previous visit (from the past 240 hour(s)).   Time coordinating discharge: Over 30 minutes  SIGNED:   Debbe Odea, MD  [facility] 11/27/2015, 11:26 AM Pager   If 7PM-7AM, please contact night-coverage [email or web address] Password [email or web address]

## 2015-11-15 NOTE — Progress Notes (Signed)
Physical Therapy Treatment Patient Details Name: Caleb Hayes MRN: [id number] DOB: 10-22-1929 Today's Date: 2015-12-07    History of Present Illness Caleb Hayes is a 82 y.o. male with medical history significant of BPH, who presents to the hospital with worsening abdominal pain and difficulty passing urine    PT Comments    Pt ambulated in hallway again today and tolerated increased distance.  Pt also did not use IV pole today and did have one instance of unsteadiness however able to self correct.  Pt hopeful for d/c home today.  Follow Up Recommendations  No PT follow up     Equipment Recommendations  Other (comment) (would benefit from cane however pt declines)    Recommendations for Other Services       Precautions / Restrictions Precautions Precautions: Fall    Mobility  Bed Mobility Overal bed mobility: Needs Assistance Bed Mobility: Supine to Sit     Supine to sit: Supervision Sit to supine: Supervision   General bed mobility comments: supervision for foley   Transfers Overall transfer level: Needs assistance Equipment used: None Transfers: Sit to/from Stand Sit to Stand: Supervision         General transfer comment: supervision for safety  Ambulation/Gait Ambulation/Gait assistance: Supervision Ambulation Distance (Feet): 400 Feet Assistive device: None (pushed IV pole) Gait Pattern/deviations: Step-through pattern;Narrow base of support Gait velocity: may benefit from cane but declines   General Gait Details: mostly supervision, one instance of unsteadiness and able to self correct (pt not using IV pole today)   Stairs            Wheelchair Mobility    Modified Rankin (Stroke Patients Only)       Balance                                    Cognition Arousal/Alertness: Awake/alert Behavior During Therapy: WFL for tasks assessed/performed Overall Cognitive Status: Within Functional Limits for tasks assessed                      Exercises      General Comments        Pertinent Vitals/Pain Pain Assessment: No/denies pain    Home Living                      Prior Function            PT Goals (current goals can now be found in the care plan section) Progress towards PT goals: Progressing toward goals    Frequency  Min 3X/week    PT Plan Current plan remains appropriate    Co-evaluation             End of Session   Activity Tolerance: Patient tolerated treatment well Patient left: with call bell/phone within reach;in bed;with family/visitor present;with bed alarm set     Time: [id number] PT Time Calculation (min) (ACUTE ONLY): 11 min  Charges:  $Gait Training: 8-22 mins                    G Codes:      Dalaney Needle,KATHrine E Dec 07, 2015, 1:22 PM Carmelia Bake, PT, DPT 07-Dec-2015 Pager: [phone]

## 2015-11-27 DIAGNOSIS — I120 Hypertensive chronic kidney disease with stage 5 chronic kidney disease or end stage renal disease: Secondary | ICD-10-CM

## 2015-11-27 DIAGNOSIS — N400 Enlarged prostate without lower urinary tract symptoms: Secondary | ICD-10-CM

## 2015-11-27 DIAGNOSIS — N185 Chronic kidney disease, stage 5: Secondary | ICD-10-CM

## 2015-11-28 ENCOUNTER — Ambulatory Visit: Payer: Medicare Other

## 2015-11-28 ENCOUNTER — Other Ambulatory Visit: Payer: Medicare Other

## 2015-11-29 ENCOUNTER — Ambulatory Visit: Payer: Medicare Other

## 2015-11-29 DIAGNOSIS — N1330 Unspecified hydronephrosis: Secondary | ICD-10-CM | POA: Diagnosis not present

## 2015-11-29 DIAGNOSIS — R338 Other retention of urine: Secondary | ICD-10-CM | POA: Diagnosis not present

## 2015-12-20 DIAGNOSIS — N400 Enlarged prostate without lower urinary tract symptoms: Secondary | ICD-10-CM | POA: Diagnosis not present

## 2015-12-20 DIAGNOSIS — R338 Other retention of urine: Secondary | ICD-10-CM | POA: Diagnosis not present

## 2015-12-24 ENCOUNTER — Other Ambulatory Visit: Payer: Self-pay | Admitting: Oncology

## 2015-12-25 ENCOUNTER — Telehealth: Payer: Self-pay | Admitting: *Deleted

## 2015-12-25 NOTE — Telephone Encounter (Signed)
Call received @ 1020 from patients daughter in regards to needing a refill on his Colchicine 0.6mg . She stated that he was completely out of his meds.

## 2015-12-26 ENCOUNTER — Telehealth: Payer: Self-pay | Admitting: *Deleted

## 2015-12-26 ENCOUNTER — Other Ambulatory Visit: Payer: Self-pay | Admitting: *Deleted

## 2015-12-26 ENCOUNTER — Other Ambulatory Visit (HOSPITAL_BASED_OUTPATIENT_CLINIC_OR_DEPARTMENT_OTHER): Payer: Medicare Other

## 2015-12-26 ENCOUNTER — Ambulatory Visit (HOSPITAL_BASED_OUTPATIENT_CLINIC_OR_DEPARTMENT_OTHER): Payer: Medicare Other

## 2015-12-26 VITALS — BP 180/68 | HR 80 | Temp 97.5°F | Resp 20

## 2015-12-26 DIAGNOSIS — M1000 Idiopathic gout, unspecified site: Secondary | ICD-10-CM

## 2015-12-26 DIAGNOSIS — D649 Anemia, unspecified: Secondary | ICD-10-CM

## 2015-12-26 DIAGNOSIS — I10 Essential (primary) hypertension: Secondary | ICD-10-CM

## 2015-12-26 DIAGNOSIS — D510 Vitamin B12 deficiency anemia due to intrinsic factor deficiency: Secondary | ICD-10-CM

## 2015-12-26 LAB — CBC WITH DIFFERENTIAL/PLATELET
BASO%: 0.9 % (ref 0.0–2.0)
Basophils Absolute: 0.1 10*3/uL (ref 0.0–0.1)
EOS%: 8.5 % — ABNORMAL HIGH (ref 0.0–7.0)
Eosinophils Absolute: 0.6 10*3/uL — ABNORMAL HIGH (ref 0.0–0.5)
HCT: 31.1 % — ABNORMAL LOW (ref 38.4–49.9)
HGB: 10.6 g/dL — ABNORMAL LOW (ref 13.0–17.1)
LYMPH%: 43.9 % (ref 14.0–49.0)
MCH: 26.8 pg — AB (ref 27.2–33.4)
MCHC: 34.1 g/dL (ref 32.0–36.0)
MCV: 78.5 fL — ABNORMAL LOW (ref 79.3–98.0)
MONO#: 0.5 10*3/uL (ref 0.1–0.9)
MONO%: 7.6 % (ref 0.0–14.0)
NEUT%: 39.1 % (ref 39.0–75.0)
NEUTROS ABS: 2.5 10*3/uL (ref 1.5–6.5)
PLATELETS: 216 10*3/uL (ref 140–400)
RBC: 3.96 10*6/uL — AB (ref 4.20–5.82)
RDW: 16.3 % — ABNORMAL HIGH (ref 11.0–14.6)
WBC: 6.5 10*3/uL (ref 4.0–10.3)
lymph#: 2.8 10*3/uL (ref 0.9–3.3)
nRBC: 0 % (ref 0–0)

## 2015-12-26 MED ORDER — CYANOCOBALAMIN 1000 MCG/ML IJ SOLN
1000.0000 ug | Freq: Once | INTRAMUSCULAR | Status: AC
  Administered 2015-12-26: 1000 ug via INTRAMUSCULAR

## 2015-12-26 MED ORDER — DARBEPOETIN ALFA 300 MCG/0.6ML IJ SOSY: 300.0000 ug | PREFILLED_SYRINGE | Freq: Once | INTRAMUSCULAR | Status: DC

## 2015-12-26 MED ORDER — LISINOPRIL 5 MG PO TABS: 5.0000 mg | ORAL_TABLET | Freq: Every day | ORAL | 2 refills | Status: DC

## 2015-12-26 MED ORDER — COLCHICINE 0.6 MG PO TABS: 0.3000 mg | ORAL_TABLET | Freq: Every day | ORAL | 0 refills | Status: DC

## 2015-12-26 NOTE — Telephone Encounter (Signed)
This RN placed referral for primary care to [facility] on [address] due to need for care related to non hematologic request per pt and daughter.

## 2015-12-26 NOTE — Patient Instructions (Addendum)
Darbepoetin Alfa injection What is this medicine? DARBEPOETIN ALFA (dar be POE e tin AL fa) helps your body make more red blood cells. It is used to treat anemia caused by chronic kidney failure and chemotherapy. This medicine may be used for other purposes; ask your health care provider or pharmacist if you have questions. What should I tell my health care provider before I take this medicine? They need to know if you have any of these conditions: -blood clotting disorders or history of blood clots -cancer patient not on chemotherapy -cystic fibrosis -heart disease, such as angina, heart failure, or a history of a heart attack -hemoglobin level of 12 g/dL or greater -high blood pressure -low levels of folate, iron, or vitamin B12 -seizures -an unusual or allergic reaction to darbepoetin, erythropoietin, albumin, hamster proteins, latex, other medicines, foods, dyes, or preservatives -pregnant or trying to get pregnant -breast-feeding How should I use this medicine? This medicine is for injection into a vein or under the skin. It is usually given by a health care professional in a hospital or clinic setting. If you get this medicine at home, you will be taught how to prepare and give this medicine. Do not shake the solution before you withdraw a dose. Use exactly as directed. Take your medicine at regular intervals. Do not take your medicine more often than directed. It is important that you put your used needles and syringes in a special sharps container. Do not put them in a trash can. If you do not have a sharps container, call your pharmacist or healthcare provider to get one. Talk to your pediatrician regarding the use of this medicine in children. While this medicine may be used in children as young as 1 year for selected conditions, precautions do apply. Overdosage: If you think you have taken too much of this medicine contact a poison control center or emergency room at once. NOTE:  This medicine is only for you. Do not share this medicine with others. What if I miss a dose? If you miss a dose, take it as soon as you can. If it is almost time for your next dose, take only that dose. Do not take double or extra doses. What may interact with this medicine? Do not take this medicine with any of the following medications: -epoetin alfa This list may not describe all possible interactions. Give your health care provider a list of all the medicines, herbs, non-prescription drugs, or dietary supplements you use. Also tell them if you smoke, drink alcohol, or use illegal drugs. Some items may interact with your medicine. What should I watch for while using this medicine? Visit your prescriber or health care professional for regular checks on your progress and for the needed blood tests and blood pressure measurements. It is especially important for the doctor to make sure your hemoglobin level is in the desired range, to limit the risk of potential side effects and to give you the best benefit. Keep all appointments for any recommended tests. Check your blood pressure as directed. Ask your doctor what your blood pressure should be and when you should contact him or her. As your body makes more red blood cells, you may need to take iron, folic acid, or vitamin B supplements. Ask your doctor or health care provider which products are right for you. If you have kidney disease continue dietary restrictions, even though this medication can make you feel better. Talk with your doctor or health care professional about the   foods you eat and the vitamins that you take. What side effects may I notice from receiving this medicine? Side effects that you should report to your doctor or health care professional as soon as possible: -allergic reactions like skin rash, itching or hives, swelling of the face, lips, or tongue -breathing problems -changes in vision -chest pain -confusion, trouble speaking  or understanding -feeling faint or lightheaded, falls -high blood pressure -muscle aches or pains -pain, swelling, warmth in the leg -rapid weight gain -severe headaches -sudden numbness or weakness of the face, arm or leg -trouble walking, dizziness, loss of balance or coordination -seizures (convulsions) -swelling of the ankles, feet, hands -unusually weak or tired Side effects that usually do not require medical attention (report to your doctor or health care professional if they continue or are bothersome): -diarrhea -fever, chills (flu-like symptoms) -headaches -nausea, vomiting -redness, stinging, or swelling at site where injected This list may not describe all possible side effects. Call your doctor for medical advice about side effects. You may report side effects to FDA at 1-800-FDA-1088. Where should I keep my medicine? Keep out of the reach of children. Store in a refrigerator between 2 and 8 degrees C (36 and 46 degrees F). Do not freeze. Do not shake. Throw away any unused portion if using a single-dose vial. Throw away any unused medicine after the expiration date. NOTE: This sheet is a summary. It may not cover all possible information. If you have questions about this medicine, talk to your doctor, pharmacist, or health care provider.    2016, Elsevier/Gold Standard. (2008-02-09 10:23:57) Cyanocobalamin, Vitamin B12 injection What is this medicine? CYANOCOBALAMIN (sye an oh koe BAL a min) is a man made form of vitamin B12. Vitamin B12 is used in the growth of healthy blood cells, nerve cells, and proteins in the body. It also helps with the metabolism of fats and carbohydrates. This medicine is used to treat people who can not absorb vitamin B12. This medicine may be used for other purposes; ask your health care provider or pharmacist if you have questions. What should I tell my health care provider before I take this medicine? They need to know if you have any of  these conditions: -kidney disease -Leber's disease -megaloblastic anemia -an unusual or allergic reaction to cyanocobalamin, cobalt, other medicines, foods, dyes, or preservatives -pregnant or trying to get pregnant -breast-feeding How should I use this medicine? This medicine is injected into a muscle or deeply under the skin. It is usually given by a health care professional in a clinic or doctor's office. However, your doctor may teach you how to inject yourself. Follow all instructions. Talk to your pediatrician regarding the use of this medicine in children. Special care may be needed. Overdosage: If you think you have taken too much of this medicine contact a poison control center or emergency room at once. NOTE: This medicine is only for you. Do not share this medicine with others. What if I miss a dose? If you are given your dose at a clinic or doctor's office, call to reschedule your appointment. If you give your own injections and you miss a dose, take it as soon as you can. If it is almost time for your next dose, take only that dose. Do not take double or extra doses. What may interact with this medicine? -colchicine -heavy alcohol intake This list may not describe all possible interactions. Give your health care provider a list of all the medicines, herbs, non-prescription  drugs, or dietary supplements you use. Also tell them if you smoke, drink alcohol, or use illegal drugs. Some items may interact with your medicine. What should I watch for while using this medicine? Visit your doctor or health care professional regularly. You may need blood work done while you are taking this medicine. You may need to follow a special diet. Talk to your doctor. Limit your alcohol intake and avoid smoking to get the best benefit. What side effects may I notice from receiving this medicine? Side effects that you should report to your doctor or health care professional as soon as  possible: -allergic reactions like skin rash, itching or hives, swelling of the face, lips, or tongue -blue tint to skin -chest tightness, pain -difficulty breathing, wheezing -dizziness -red, swollen painful area on the leg Side effects that usually do not require medical attention (report to your doctor or health care professional if they continue or are bothersome): -diarrhea -headache This list may not describe all possible side effects. Call your doctor for medical advice about side effects. You may report side effects to FDA at 1-800-FDA-1088. Where should I keep my medicine? Keep out of the reach of children. Store at room temperature between 15 and 30 degrees C (59 and 85 degrees F). Protect from light. Throw away any unused medicine after the expiration date. NOTE: This sheet is a summary. It may not cover all possible information. If you have questions about this medicine, talk to your doctor, pharmacist, or health care provider.    2016, Elsevier/Gold Standard. (2007-06-08 22:10:20) Cyanocobalamin, Vitamin B12 injection What is this medicine? CYANOCOBALAMIN (sye an oh koe BAL a min) is a man made form of vitamin B12. Vitamin B12 is used in the growth of healthy blood cells, nerve cells, and proteins in the body. It also helps with the metabolism of fats and carbohydrates. This medicine is used to treat people who can not absorb vitamin B12. This medicine may be used for other purposes; ask your health care provider or pharmacist if you have questions. What should I tell my health care provider before I take this medicine? They need to know if you have any of these conditions: -kidney disease -Leber's disease -megaloblastic anemia -an unusual or allergic reaction to cyanocobalamin, cobalt, other medicines, foods, dyes, or preservatives -pregnant or trying to get pregnant -breast-feeding How should I use this medicine? This medicine is injected into a muscle or deeply under  the skin. It is usually given by a health care professional in a clinic or doctor's office. However, your doctor may teach you how to inject yourself. Follow all instructions. Talk to your pediatrician regarding the use of this medicine in children. Special care may be needed. Overdosage: If you think you have taken too much of this medicine contact a poison control center or emergency room at once. NOTE: This medicine is only for you. Do not share this medicine with others. What if I miss a dose? If you are given your dose at a clinic or doctor's office, call to reschedule your appointment. If you give your own injections and you miss a dose, take it as soon as you can. If it is almost time for your next dose, take only that dose. Do not take double or extra doses. What may interact with this medicine? -colchicine -heavy alcohol intake This list may not describe all possible interactions. Give your health care provider a list of all the medicines, herbs, non-prescription drugs, or dietary supplements you  use. Also tell them if you smoke, drink alcohol, or use illegal drugs. Some items may interact with your medicine. What should I watch for while using this medicine? Visit your doctor or health care professional regularly. You may need blood work done while you are taking this medicine. You may need to follow a special diet. Talk to your doctor. Limit your alcohol intake and avoid smoking to get the best benefit. What side effects may I notice from receiving this medicine? Side effects that you should report to your doctor or health care professional as soon as possible: -allergic reactions like skin rash, itching or hives, swelling of the face, lips, or tongue -blue tint to skin -chest tightness, pain -difficulty breathing, wheezing -dizziness -red, swollen painful area on the leg Side effects that usually do not require medical attention (report to your doctor or health care professional if  they continue or are bothersome): -diarrhea -headache This list may not describe all possible side effects. Call your doctor for medical advice about side effects. You may report side effects to FDA at 1-800-FDA-1088. Where should I keep my medicine? Keep out of the reach of children. Store at room temperature between 15 and 30 degrees C (59 and 85 degrees F). Protect from light. Throw away any unused medicine after the expiration date. NOTE: This sheet is a summary. It may not cover all possible information. If you have questions about this medicine, talk to your doctor, pharmacist, or health care provider.    2016, Elsevier/Gold Standard. (2007-06-08 22:10:20)

## 2015-12-26 NOTE — Progress Notes (Signed)
.   Aranesp injection today held due to blood pressure of 180/68. Dr. Jana Hakim informed of findings

## 2016-01-04 DIAGNOSIS — N1339 Other hydronephrosis: Secondary | ICD-10-CM | POA: Diagnosis not present

## 2016-01-09 ENCOUNTER — Other Ambulatory Visit: Payer: Self-pay

## 2016-01-09 MED ORDER — LISINOPRIL 5 MG PO TABS: 5.0000 mg | ORAL_TABLET | Freq: Every day | ORAL | 5 refills | Status: DC

## 2016-01-15 DIAGNOSIS — N1330 Unspecified hydronephrosis: Secondary | ICD-10-CM | POA: Diagnosis not present

## 2016-01-15 DIAGNOSIS — N200 Calculus of kidney: Secondary | ICD-10-CM | POA: Diagnosis not present

## 2016-01-15 DIAGNOSIS — N400 Enlarged prostate without lower urinary tract symptoms: Secondary | ICD-10-CM | POA: Diagnosis not present

## 2016-01-23 ENCOUNTER — Ambulatory Visit (HOSPITAL_BASED_OUTPATIENT_CLINIC_OR_DEPARTMENT_OTHER): Payer: Medicare Other

## 2016-01-23 ENCOUNTER — Other Ambulatory Visit (HOSPITAL_BASED_OUTPATIENT_CLINIC_OR_DEPARTMENT_OTHER): Payer: Medicare Other

## 2016-01-23 VITALS — BP 185/84 | HR 68 | Temp 98.2°F | Resp 18

## 2016-01-23 DIAGNOSIS — D510 Vitamin B12 deficiency anemia due to intrinsic factor deficiency: Secondary | ICD-10-CM

## 2016-01-23 DIAGNOSIS — D649 Anemia, unspecified: Secondary | ICD-10-CM

## 2016-01-23 LAB — CBC WITH DIFFERENTIAL/PLATELET
BASO%: 0.8 % (ref 0.0–2.0)
Basophils Absolute: 0.1 10*3/uL (ref 0.0–0.1)
EOS%: 9.9 % — AB (ref 0.0–7.0)
Eosinophils Absolute: 0.6 10*3/uL — ABNORMAL HIGH (ref 0.0–0.5)
HCT: 31.2 % — ABNORMAL LOW (ref 38.4–49.9)
HEMOGLOBIN: 10.5 g/dL — AB (ref 13.0–17.1)
LYMPH#: 3.1 10*3/uL (ref 0.9–3.3)
LYMPH%: 51.5 % — ABNORMAL HIGH (ref 14.0–49.0)
MCH: 26.9 pg — AB (ref 27.2–33.4)
MCHC: 33.7 g/dL (ref 32.0–36.0)
MCV: 80 fL (ref 79.3–98.0)
MONO#: 0.4 10*3/uL (ref 0.1–0.9)
MONO%: 6.7 % (ref 0.0–14.0)
NEUT#: 1.9 10*3/uL (ref 1.5–6.5)
NEUT%: 31.1 % — AB (ref 39.0–75.0)
NRBC: 0 % (ref 0–0)
Platelets: 245 10*3/uL (ref 140–400)
RBC: 3.9 10*6/uL — AB (ref 4.20–5.82)
RDW: 16.4 % — AB (ref 11.0–14.6)
WBC: 6 10*3/uL (ref 4.0–10.3)

## 2016-01-23 MED ORDER — CYANOCOBALAMIN 1000 MCG/ML IJ SOLN
1000.0000 ug | Freq: Once | INTRAMUSCULAR | Status: AC
  Administered 2016-01-23: 1000 ug via INTRAMUSCULAR

## 2016-01-23 NOTE — Patient Instructions (Signed)

## 2016-02-12 DIAGNOSIS — R338 Other retention of urine: Secondary | ICD-10-CM | POA: Diagnosis not present

## 2016-02-20 ENCOUNTER — Ambulatory Visit (HOSPITAL_BASED_OUTPATIENT_CLINIC_OR_DEPARTMENT_OTHER): Payer: Medicare Other

## 2016-02-20 ENCOUNTER — Other Ambulatory Visit (HOSPITAL_BASED_OUTPATIENT_CLINIC_OR_DEPARTMENT_OTHER): Payer: Medicare Other

## 2016-02-20 VITALS — BP 185/80 | HR 64 | Temp 97.4°F | Resp 20

## 2016-02-20 DIAGNOSIS — D510 Vitamin B12 deficiency anemia due to intrinsic factor deficiency: Secondary | ICD-10-CM

## 2016-02-20 DIAGNOSIS — D649 Anemia, unspecified: Secondary | ICD-10-CM

## 2016-02-20 LAB — CBC WITH DIFFERENTIAL/PLATELET
BASO%: 1.2 % (ref 0.0–2.0)
BASOS ABS: 0.1 10*3/uL (ref 0.0–0.1)
EOS%: 8.4 % — AB (ref 0.0–7.0)
Eosinophils Absolute: 0.6 10*3/uL — ABNORMAL HIGH (ref 0.0–0.5)
HCT: 32.9 % — ABNORMAL LOW (ref 38.4–49.9)
HGB: 11.2 g/dL — ABNORMAL LOW (ref 13.0–17.1)
LYMPH%: 42.3 % (ref 14.0–49.0)
MCH: 27.1 pg — AB (ref 27.2–33.4)
MCHC: 34 g/dL (ref 32.0–36.0)
MCV: 79.5 fL (ref 79.3–98.0)
MONO#: 0.4 10*3/uL (ref 0.1–0.9)
MONO%: 6.2 % (ref 0.0–14.0)
NEUT#: 2.9 10*3/uL (ref 1.5–6.5)
NEUT%: 41.9 % (ref 39.0–75.0)
NRBC: 0 % (ref 0–0)
Platelets: 227 10*3/uL (ref 140–400)
RBC: 4.14 10*6/uL — AB (ref 4.20–5.82)
RDW: 16.7 % — AB (ref 11.0–14.6)
WBC: 6.9 10*3/uL (ref 4.0–10.3)
lymph#: 2.9 10*3/uL (ref 0.9–3.3)

## 2016-02-20 MED ORDER — CYANOCOBALAMIN 1000 MCG/ML IJ SOLN
1000.0000 ug | Freq: Once | INTRAMUSCULAR | Status: AC
  Administered 2016-02-20: 1000 ug via INTRAMUSCULAR

## 2016-02-20 NOTE — Patient Instructions (Signed)

## 2016-03-13 DIAGNOSIS — R338 Other retention of urine: Secondary | ICD-10-CM | POA: Diagnosis not present

## 2016-03-21 ENCOUNTER — Other Ambulatory Visit: Payer: Self-pay | Admitting: Oncology

## 2016-04-12 DIAGNOSIS — R338 Other retention of urine: Secondary | ICD-10-CM | POA: Diagnosis not present

## 2016-05-13 DIAGNOSIS — R338 Other retention of urine: Secondary | ICD-10-CM | POA: Diagnosis not present

## 2016-06-11 DIAGNOSIS — R338 Other retention of urine: Secondary | ICD-10-CM | POA: Diagnosis not present

## 2016-07-03 ENCOUNTER — Ambulatory Visit: Payer: Medicare Other

## 2016-07-03 ENCOUNTER — Ambulatory Visit (HOSPITAL_BASED_OUTPATIENT_CLINIC_OR_DEPARTMENT_OTHER): Payer: Medicare Other | Admitting: Oncology

## 2016-07-03 ENCOUNTER — Other Ambulatory Visit (HOSPITAL_BASED_OUTPATIENT_CLINIC_OR_DEPARTMENT_OTHER): Payer: Medicare Other

## 2016-07-03 VITALS — BP 248/100 | HR 75 | Temp 98.2°F | Resp 18 | Ht 66.0 in | Wt 163.9 lb

## 2016-07-03 DIAGNOSIS — D649 Anemia, unspecified: Secondary | ICD-10-CM

## 2016-07-03 DIAGNOSIS — Z86718 Personal history of other venous thrombosis and embolism: Secondary | ICD-10-CM

## 2016-07-03 DIAGNOSIS — R3914 Feeling of incomplete bladder emptying: Secondary | ICD-10-CM

## 2016-07-03 DIAGNOSIS — E875 Hyperkalemia: Secondary | ICD-10-CM

## 2016-07-03 DIAGNOSIS — D510 Vitamin B12 deficiency anemia due to intrinsic factor deficiency: Secondary | ICD-10-CM

## 2016-07-03 DIAGNOSIS — I10 Essential (primary) hypertension: Secondary | ICD-10-CM | POA: Diagnosis not present

## 2016-07-03 DIAGNOSIS — N401 Enlarged prostate with lower urinary tract symptoms: Secondary | ICD-10-CM

## 2016-07-03 DIAGNOSIS — R630 Anorexia: Secondary | ICD-10-CM

## 2016-07-03 DIAGNOSIS — N289 Disorder of kidney and ureter, unspecified: Secondary | ICD-10-CM

## 2016-07-03 LAB — CBC WITH DIFFERENTIAL/PLATELET
BASO%: 0.8 % (ref 0.0–2.0)
BASOS ABS: 0.1 10*3/uL (ref 0.0–0.1)
EOS%: 5.2 % (ref 0.0–7.0)
Eosinophils Absolute: 0.4 10*3/uL (ref 0.0–0.5)
HCT: 29.2 % — ABNORMAL LOW (ref 38.4–49.9)
HGB: 9.9 g/dL — ABNORMAL LOW (ref 13.0–17.1)
LYMPH%: 42.9 % (ref 14.0–49.0)
MCH: 27.4 pg (ref 27.2–33.4)
MCHC: 33.9 g/dL (ref 32.0–36.0)
MCV: 80.9 fL (ref 79.3–98.0)
MONO#: 0.5 10*3/uL (ref 0.1–0.9)
MONO%: 6.7 % (ref 0.0–14.0)
NEUT#: 3.5 10*3/uL (ref 1.5–6.5)
NEUT%: 44.4 % (ref 39.0–75.0)
Platelets: 247 10*3/uL (ref 140–400)
RBC: 3.61 10*6/uL — AB (ref 4.20–5.82)
RDW: 15.4 % — ABNORMAL HIGH (ref 11.0–14.6)
WBC: 7.9 10*3/uL (ref 4.0–10.3)
lymph#: 3.4 10*3/uL — ABNORMAL HIGH (ref 0.9–3.3)
nRBC: 0 % (ref 0–0)

## 2016-07-03 MED ORDER — DARBEPOETIN ALFA 300 MCG/0.6ML IJ SOSY: 300.0000 ug | PREFILLED_SYRINGE | Freq: Once | INTRAMUSCULAR | Status: DC

## 2016-07-03 MED ORDER — CYANOCOBALAMIN 1000 MCG/ML IJ SOLN
1000.0000 ug | Freq: Once | INTRAMUSCULAR | Status: AC
  Administered 2016-07-03: 1000 ug via INTRAMUSCULAR

## 2016-07-03 MED ORDER — FUROSEMIDE 10 MG/ML IJ SOLN
INTRAMUSCULAR | Status: AC
  Filled 2016-07-03: qty 4

## 2016-07-03 MED ORDER — LOSARTAN POTASSIUM-HCTZ 100-25 MG PO TABS: 1.0000 | ORAL_TABLET | Freq: Every day | ORAL | 4 refills | Status: DC

## 2016-07-03 MED ORDER — CYANOCOBALAMIN 1000 MCG/ML IJ SOLN
INTRAMUSCULAR | Status: AC
  Filled 2016-07-03: qty 1

## 2016-07-03 MED ORDER — FUROSEMIDE 10 MG/ML IJ SOLN
40.0000 mg | Freq: Once | INTRAMUSCULAR | Status: AC
  Administered 2016-07-03: 40 mg via INTRAMUSCULAR

## 2016-07-03 NOTE — Progress Notes (Signed)
[facility]  [phone] [phone]     ID: Raymar Joiner [place] OB: 1929/09/20  MR#: [id number]  VVL#:[id number]  PCP: Pcp Not In System GYN:   SU:  OTHER MD:  CHIEF COMPLAINT: Pernicious anemia  CURRENT TREATMENT: B-12 supplementation, aranesp for anemia of renal failure   HX PRESENT ILLNESS: From the earlier summary note:  Mr. Livermore presented to the emergency room on 05/16/2013 with complaints of weakness, fatigue, and lower extremity pain. Exam was unrevealing but lab work showed a creatinine of 4.34 with BUN 89. Potassium, sodium, calcium, and total protein were normal. The white cell count was 3.7, platelet count 75,000 and hemoglobin 7.3 with an MCV of 98. Ferritin was 3619, folate 14.4, and B12 160. Bilateral venous Dopplers were consistent with acute deep venous thrombosis involving the right common femoral vein, right popliteal vein and the) [name]. On the left side there was a posterior tibial vein thrombus of indeterminate age.   We were consulted to further evaluate the cytopenias and rule out multiple myeloma. In addition there was a question of anticoagulation in this patient with thrombocytopenia. The patient's subsequet history is as detailed below  INTERVAL HISTORY: Mr. Mapel returns today for follow-up of his pernicious anemia and anemia of renal failure accompanied by his daughter, Doroteo Bradford. At the patient has missed several months of treatment. He simply "ran out of appointments". He is here today to resume follow-up and treatment.  REVIEW OF SYSTEMS: Mr. Hashemi tells me he is doing "fine". He is seeing urology for his prostate problems but does not recall the name of his urologist. He now has a Foley prominently in place as he could not urinate otherwise. She stopped taking his blood pressure medicine because it was making him dizzy he says He stopped multiple other medicines as well. He denies unusual headaches, visual changes,  nausea, vomiting, stiff neck, or falls he denies chest pain or pressure. A detailed review of systems today was otherwise stable.  PAST MEDICAL HISTORY: Past Medical History:  Diagnosis Date  . Gout   . Hypertension     PAST SURGICAL HISTORY: No past surgical history on file.  FAMILY HISTORY No family history on file.  SOCIAL HISTORY:  The patient's daughter Apolonio Schneiders lives with him. She has a 3d shift job. The patient's sister lives nearby and "can come in" as needed.    ADVANCED DIRECTIVES: At his visit 07/04/2015 the patient was given advanced directive forms to complete and notarize. These were in fact completed but they are not yet scanned   HEALTH MAINTENANCE: Social History  Substance Use Topics  . Smoking status: Former Research scientist (life sciences)  . Smokeless tobacco: Never Used  . Alcohol use Yes     Comment: "every once in a while, not too regular"    No Known Allergies  Current Outpatient Prescriptions  Medication Sig Dispense Refill  . Cyanocobalamin (VITAMIN B-12 IJ) Inject 1 mL as directed every 30 (thirty) days.    . feeding supplement (ENSURE IMMUNE HEALTH) LIQD Take 237 mLs by mouth daily as needed (NUTRITIONAL).    . Multiple Vitamin (MULTIVITAMIN WITH MINERALS) TABS tablet Take 1 tablet by mouth daily.     No current facility-administered medications for this visit.     OBJECTIVE: elderly African American man In no acute distress Vitals:   07/03/16 1050 07/03/16 1051  BP: (!) 250/92 (!) 248/100  Pulse: 75   Resp: 18   Temp: 98.2 F (36.8 C)  Body mass index is 26.45 kg/m.    ECOG FS:1 - Symptomatic but completely ambulatory  Sclerae unicteric, Bilateral arcus 7 Ellis Oropharynx clear, dentition poor No cervical or supraclavicular adenopathy Lungs no rales or rhonchi Heart regular rate and rhythm Abd soft, nontender, positive bowel sounds MSK no focal spinal tenderness Neuro: nonfocal, appropriate affect   LAB RESULTS:  CMP     Component Value  Date/Time   NA 142 11/15/2015 0545   NA 142 07/04/2015 0908   K 3.5 11/15/2015 0545   K 6.2 (HH) 07/04/2015 0908   CL 118 (H) 11/15/2015 0545   CO2 18 (L) 11/15/2015 0545   CO2 22 07/04/2015 0908   GLUCOSE 100 (H) 11/15/2015 0545   GLUCOSE 81 07/04/2015 0908   BUN 57 (H) 11/15/2015 0545   BUN 24.2 07/04/2015 0908   CREATININE 4.87 (H) 11/15/2015 0545   CREATININE 3.5 (HH) 07/04/2015 0908   CALCIUM 7.6 (L) 11/15/2015 0545   CALCIUM 8.8 07/04/2015 0908   PROT 6.8 11/12/2015 0558   PROT 7.0 07/04/2015 0908   ALBUMIN 1.9 (L) 11/13/2015 0501   ALBUMIN 2.9 (L) 07/04/2015 0908   AST 31 11/12/2015 0558   AST 14 07/04/2015 0908   ALT 18 11/12/2015 0558   ALT <9 07/04/2015 0908   ALKPHOS 75 11/12/2015 0558   ALKPHOS 96 07/04/2015 0908   BILITOT 0.3 11/12/2015 0558   BILITOT <0.30 07/04/2015 0908   GFRNONAA 10 (L) 11/15/2015 0545   GFRAA 11 (L) 11/15/2015 0545    I No results found for: SPEP  Lab Results  Component Value Date   WBC 7.9 07/03/2016   NEUTROABS 3.5 07/03/2016   HGB 9.9 (L) 07/03/2016   HCT 29.2 (L) 07/03/2016   MCV 80.9 07/03/2016   PLT 247 07/03/2016    @LASTCHEMISTRY @  No results found for: LABCA2  No components found for: PPJKD326  No results for input(s): INR in the last 168 hours.  Urinalysis    Component Value Date/Time   COLORURINE RED (A) 11/11/2015 1310   APPEARANCEUR TURBID (A) 11/11/2015 1310   LABSPEC 1.028 11/11/2015 1310   PHURINE 7.5 11/11/2015 1310   GLUCOSEU 100 (A) 11/11/2015 1310   HGBUR LARGE (A) 11/11/2015 1310   BILIRUBINUR NEGATIVE 11/11/2015 1310   KETONESUR NEGATIVE 11/11/2015 1310   PROTEINUR >300 (A) 11/11/2015 1310   UROBILINOGEN 0.2 08/05/2014 1446   NITRITE NEGATIVE 11/11/2015 1310   LEUKOCYTESUR MODERATE (A) 11/11/2015 1310    STUDIES: EKGs Today showed bradycardia but were otherwise unremarkable  ASSESSMENT: 83 y.o. year old [place] man   (1) pernicious anemia with intrinsic factor antibodies documented  March 2015  (2) renal insufficiency with negative myeloma workup   (3) Right lower extremity DVT March 2015, treated with coumadin for 6 months  (4) poorly controlled hypertension  (5) hyperkalemia  PLAN: Mr. Windt is appointments simply ran out and they did not call to resume his treatments but he did have today's appointment which a. We are putting him back on shots every 4 weeks for the next year. He will be receiving his B12 supplementation and also Aranesp as needed to try to keep his hemoglobin between 10 and 11.  His blood pressure is uncontrolled. He is asymptomatic from this. He has not been taking blood pressure medication now for several months he tells me the lisinopril was making him dizzy.  We went over the fact that if he does not control his blood pressure he can have strokes, heart attacks, or loses kidneys.  I am starting him on Hyzaar and the daughter tells me they will pick that medication up on the way home. He will receive a dose of Lasix today when he receives his other treatments.  Otherwise the patient will continue this shots every 4 weeks and see me again in one year. They note of call for any other problems that may develop before his next visit.   Chauncey Cruel, MD   07/03/2016 10:53 AM okay [phone] okay and was followed: Thank you

## 2016-07-09 DIAGNOSIS — R338 Other retention of urine: Secondary | ICD-10-CM | POA: Diagnosis not present

## 2016-07-31 ENCOUNTER — Ambulatory Visit (HOSPITAL_BASED_OUTPATIENT_CLINIC_OR_DEPARTMENT_OTHER): Payer: Medicare Other

## 2016-07-31 VITALS — BP 200/80 | HR 64 | Temp 97.0°F | Resp 18

## 2016-07-31 DIAGNOSIS — D510 Vitamin B12 deficiency anemia due to intrinsic factor deficiency: Secondary | ICD-10-CM | POA: Diagnosis not present

## 2016-07-31 MED ORDER — CYANOCOBALAMIN 1000 MCG/ML IJ SOLN
1000.0000 ug | Freq: Once | INTRAMUSCULAR | Status: AC
  Administered 2016-07-31: 1000 ug via INTRAMUSCULAR

## 2016-07-31 NOTE — Patient Instructions (Signed)

## 2016-08-06 DIAGNOSIS — R338 Other retention of urine: Secondary | ICD-10-CM | POA: Diagnosis not present

## 2016-08-27 ENCOUNTER — Other Ambulatory Visit: Payer: Self-pay

## 2016-08-27 DIAGNOSIS — N189 Chronic kidney disease, unspecified: Secondary | ICD-10-CM

## 2016-08-27 DIAGNOSIS — N179 Acute kidney failure, unspecified: Secondary | ICD-10-CM

## 2016-08-27 DIAGNOSIS — D649 Anemia, unspecified: Secondary | ICD-10-CM

## 2016-08-28 ENCOUNTER — Ambulatory Visit (HOSPITAL_BASED_OUTPATIENT_CLINIC_OR_DEPARTMENT_OTHER): Payer: Medicare Other

## 2016-08-28 ENCOUNTER — Other Ambulatory Visit (HOSPITAL_BASED_OUTPATIENT_CLINIC_OR_DEPARTMENT_OTHER): Payer: Medicare Other

## 2016-08-28 VITALS — BP 197/71 | HR 62 | Temp 97.4°F | Resp 20

## 2016-08-28 DIAGNOSIS — N189 Chronic kidney disease, unspecified: Secondary | ICD-10-CM

## 2016-08-28 DIAGNOSIS — D510 Vitamin B12 deficiency anemia due to intrinsic factor deficiency: Secondary | ICD-10-CM

## 2016-08-28 DIAGNOSIS — D649 Anemia, unspecified: Secondary | ICD-10-CM

## 2016-08-28 DIAGNOSIS — N179 Acute kidney failure, unspecified: Secondary | ICD-10-CM

## 2016-08-28 LAB — COMPREHENSIVE METABOLIC PANEL
ALK PHOS: 145 U/L (ref 40–150)
ALT: 7 U/L (ref 0–55)
AST: 15 U/L (ref 5–34)
Albumin: 3.3 g/dL — ABNORMAL LOW (ref 3.5–5.0)
Anion Gap: 8 mEq/L (ref 3–11)
BUN: 47.5 mg/dL — AB (ref 7.0–26.0)
CHLORIDE: 114 meq/L — AB (ref 98–109)
CO2: 18 meq/L — AB (ref 22–29)
Calcium: 9 mg/dL (ref 8.4–10.4)
Creatinine: 7.2 mg/dL (ref 0.7–1.3)
EGFR: 7 mL/min/{1.73_m2} — AB (ref >90)
GLUCOSE: 91 mg/dL (ref 70–140)
POTASSIUM: 5.5 meq/L — AB (ref 3.5–5.1)
SODIUM: 139 meq/L (ref 136–145)
Total Bilirubin: 0.49 mg/dL (ref 0.20–1.20)
Total Protein: 7.2 g/dL (ref 6.4–8.3)

## 2016-08-28 LAB — CBC WITH DIFFERENTIAL/PLATELET
BASO%: 1.1 % (ref 0.0–2.0)
Basophils Absolute: 0.1 10*3/uL (ref 0.0–0.1)
EOS%: 9.3 % — AB (ref 0.0–7.0)
Eosinophils Absolute: 0.5 10*3/uL (ref 0.0–0.5)
HEMATOCRIT: 31.8 % — AB (ref 38.4–49.9)
HEMOGLOBIN: 10.6 g/dL — AB (ref 13.0–17.1)
LYMPH#: 2.5 10*3/uL (ref 0.9–3.3)
LYMPH%: 49.2 % — ABNORMAL HIGH (ref 14.0–49.0)
MCH: 28.2 pg (ref 27.2–33.4)
MCHC: 33.3 g/dL (ref 32.0–36.0)
MCV: 84.6 fL (ref 79.3–98.0)
MONO#: 0.4 10*3/uL (ref 0.1–0.9)
MONO%: 6.8 % (ref 0.0–14.0)
NEUT#: 1.7 10*3/uL (ref 1.5–6.5)
NEUT%: 33.6 % — ABNORMAL LOW (ref 39.0–75.0)
Platelets: 238 10*3/uL (ref 140–400)
RBC: 3.76 10*6/uL — ABNORMAL LOW (ref 4.20–5.82)
RDW: 15.2 % — AB (ref 11.0–14.6)
WBC: 5.2 10*3/uL (ref 4.0–10.3)

## 2016-08-28 MED ORDER — CYANOCOBALAMIN 1000 MCG/ML IJ SOLN
1000.0000 ug | Freq: Once | INTRAMUSCULAR | Status: AC
  Administered 2016-08-28: 1000 ug via INTRAMUSCULAR

## 2016-08-28 NOTE — Patient Instructions (Signed)

## 2016-08-28 NOTE — Progress Notes (Signed)
Aranssp injection held today, due to elevated BP of 197/71, Hemoglobin noted at 10.6 today Dr. Jana Hakim informed of findings.

## 2016-09-10 DIAGNOSIS — R338 Other retention of urine: Secondary | ICD-10-CM | POA: Diagnosis not present

## 2016-09-24 ENCOUNTER — Other Ambulatory Visit: Payer: Self-pay

## 2016-09-24 DIAGNOSIS — D649 Anemia, unspecified: Secondary | ICD-10-CM

## 2016-09-25 ENCOUNTER — Other Ambulatory Visit: Payer: Self-pay | Admitting: Oncology

## 2016-09-25 ENCOUNTER — Other Ambulatory Visit: Payer: Self-pay

## 2016-09-25 ENCOUNTER — Telehealth: Payer: Self-pay | Admitting: Emergency Medicine

## 2016-09-25 ENCOUNTER — Other Ambulatory Visit: Payer: Self-pay | Admitting: Emergency Medicine

## 2016-09-25 ENCOUNTER — Other Ambulatory Visit (HOSPITAL_BASED_OUTPATIENT_CLINIC_OR_DEPARTMENT_OTHER): Payer: Medicare Other

## 2016-09-25 ENCOUNTER — Ambulatory Visit (HOSPITAL_BASED_OUTPATIENT_CLINIC_OR_DEPARTMENT_OTHER): Payer: Medicare Other

## 2016-09-25 VITALS — BP 216/83 | HR 57 | Temp 97.9°F | Resp 18

## 2016-09-25 DIAGNOSIS — D510 Vitamin B12 deficiency anemia due to intrinsic factor deficiency: Secondary | ICD-10-CM

## 2016-09-25 DIAGNOSIS — D649 Anemia, unspecified: Secondary | ICD-10-CM

## 2016-09-25 DIAGNOSIS — N184 Chronic kidney disease, stage 4 (severe): Secondary | ICD-10-CM

## 2016-09-25 LAB — CBC WITH DIFFERENTIAL/PLATELET
BASO%: 1.3 % (ref 0.0–2.0)
Basophils Absolute: 0.1 10*3/uL (ref 0.0–0.1)
EOS ABS: 0.5 10*3/uL (ref 0.0–0.5)
EOS%: 8.3 % — ABNORMAL HIGH (ref 0.0–7.0)
HCT: 31.9 % — ABNORMAL LOW (ref 38.4–49.9)
HGB: 10.8 g/dL — ABNORMAL LOW (ref 13.0–17.1)
LYMPH%: 45.1 % (ref 14.0–49.0)
MCH: 28.4 pg (ref 27.2–33.4)
MCHC: 33.9 g/dL (ref 32.0–36.0)
MCV: 83.9 fL (ref 79.3–98.0)
MONO#: 0.4 10*3/uL (ref 0.1–0.9)
MONO%: 5.6 % (ref 0.0–14.0)
NEUT%: 39.7 % (ref 39.0–75.0)
NEUTROS ABS: 2.5 10*3/uL (ref 1.5–6.5)
NRBC: 0 % (ref 0–0)
PLATELETS: 247 10*3/uL (ref 140–400)
RBC: 3.8 10*6/uL — AB (ref 4.20–5.82)
RDW: 14.8 % — AB (ref 11.0–14.6)
WBC: 6.3 10*3/uL (ref 4.0–10.3)
lymph#: 2.8 10*3/uL (ref 0.9–3.3)

## 2016-09-25 LAB — COMPREHENSIVE METABOLIC PANEL
ALT: 7 U/L (ref 0–55)
AST: 15 U/L (ref 5–34)
Albumin: 3.4 g/dL — ABNORMAL LOW (ref 3.5–5.0)
Alkaline Phosphatase: 133 U/L (ref 40–150)
Anion Gap: 10 mEq/L (ref 3–11)
BILIRUBIN TOTAL: 0.52 mg/dL (ref 0.20–1.20)
BUN: 43.8 mg/dL — AB (ref 7.0–26.0)
CO2: 18 meq/L — AB (ref 22–29)
CREATININE: 6.5 mg/dL — AB (ref 0.7–1.3)
Calcium: 9.1 mg/dL (ref 8.4–10.4)
Chloride: 111 mEq/L — ABNORMAL HIGH (ref 98–109)
EGFR: 8 mL/min/{1.73_m2} — ABNORMAL LOW (ref >90)
GLUCOSE: 84 mg/dL (ref 70–140)
Potassium: 6 mEq/L — ABNORMAL HIGH (ref 3.5–5.1)
SODIUM: 139 meq/L (ref 136–145)
TOTAL PROTEIN: 7.7 g/dL (ref 6.4–8.3)

## 2016-09-25 MED ORDER — CYANOCOBALAMIN 1000 MCG/ML IJ SOLN
1000.0000 ug | Freq: Once | INTRAMUSCULAR | Status: AC
  Administered 2016-09-25: 1000 ug via INTRAMUSCULAR

## 2016-09-25 NOTE — Patient Instructions (Signed)
Cyanocobalamin, Vitamin B12 injection What is this medicine? CYANOCOBALAMIN (sye an oh koe BAL a min) is a man made form of vitamin B12. Vitamin B12 is used in the growth of healthy blood cells, nerve cells, and proteins in the body. It also helps with the metabolism of fats and carbohydrates. This medicine is used to treat people who can not absorb vitamin B12. This medicine may be used for other purposes; ask your health care provider or pharmacist if you have questions. COMMON BRAND NAME(S): B-12 Compliance Kit, B-12 Injection Kit, Cyomin, LA-12, Nutri-Twelve, Physicians EZ Use B-12, Primabalt What should I tell my health care provider before I take this medicine? They need to know if you have any of these conditions: -kidney disease -Leber's disease -megaloblastic anemia -an unusual or allergic reaction to cyanocobalamin, cobalt, other medicines, foods, dyes, or preservatives -pregnant or trying to get pregnant -breast-feeding How should I use this medicine? This medicine is injected into a muscle or deeply under the skin. It is usually given by a health care professional in a clinic or doctor's office. However, your doctor may teach you how to inject yourself. Follow all instructions. Talk to your pediatrician regarding the use of this medicine in children. Special care may be needed. Overdosage: If you think you have taken too much of this medicine contact a poison control center or emergency room at once. NOTE: This medicine is only for you. Do not share this medicine with others. What if I miss a dose? If you are given your dose at a clinic or doctor's office, call to reschedule your appointment. If you give your own injections and you miss a dose, take it as soon as you can. If it is almost time for your next dose, take only that dose. Do not take double or extra doses. What may interact with this medicine? -colchicine -heavy alcohol intake This list may not describe all possible  interactions. Give your health care provider a list of all the medicines, herbs, non-prescription drugs, or dietary supplements you use. Also tell them if you smoke, drink alcohol, or use illegal drugs. Some items may interact with your medicine. What should I watch for while using this medicine? Visit your doctor or health care professional regularly. You may need blood work done while you are taking this medicine. You may need to follow a special diet. Talk to your doctor. Limit your alcohol intake and avoid smoking to get the best benefit. What side effects may I notice from receiving this medicine? Side effects that you should report to your doctor or health care professional as soon as possible: -allergic reactions like skin rash, itching or hives, swelling of the face, lips, or tongue -blue tint to skin -chest tightness, pain -difficulty breathing, wheezing -dizziness -red, swollen painful area on the leg Side effects that usually do not require medical attention (report to your doctor or health care professional if they continue or are bothersome): -diarrhea -headache This list may not describe all possible side effects. Call your doctor for medical advice about side effects. You may report side effects to FDA at 1-800-FDA-1088. Where should I keep my medicine? Keep out of the reach of children. Store at room temperature between 15 and 30 degrees C (59 and 85 degrees F). Protect from light. Throw away any unused medicine after the expiration date. NOTE: This sheet is a summary. It may not cover all possible information. If you have questions about this medicine, talk to your doctor, pharmacist, or   health care provider.  2018 Elsevier/Gold Standard (2007-06-08 22:10:20)  

## 2016-09-30 ENCOUNTER — Ambulatory Visit: Payer: Medicare Other | Admitting: Family

## 2016-10-01 ENCOUNTER — Other Ambulatory Visit (INDEPENDENT_AMBULATORY_CARE_PROVIDER_SITE_OTHER): Payer: Medicare Other

## 2016-10-01 ENCOUNTER — Telehealth: Payer: Self-pay

## 2016-10-01 ENCOUNTER — Ambulatory Visit (INDEPENDENT_AMBULATORY_CARE_PROVIDER_SITE_OTHER): Payer: Medicare Other | Admitting: Family

## 2016-10-01 ENCOUNTER — Encounter: Payer: Self-pay | Admitting: Family

## 2016-10-01 VITALS — BP 162/78 | HR 92 | Temp 98.3°F | Resp 16 | Ht 66.0 in | Wt 157.0 lb

## 2016-10-01 DIAGNOSIS — I120 Hypertensive chronic kidney disease with stage 5 chronic kidney disease or end stage renal disease: Secondary | ICD-10-CM | POA: Diagnosis not present

## 2016-10-01 DIAGNOSIS — N185 Chronic kidney disease, stage 5: Secondary | ICD-10-CM

## 2016-10-01 DIAGNOSIS — I10 Essential (primary) hypertension: Secondary | ICD-10-CM

## 2016-10-01 LAB — RENAL FUNCTION PANEL
ALBUMIN: 3.8 g/dL (ref 3.5–5.2)
BUN: 41 mg/dL — AB (ref 6–23)
CALCIUM: 8.9 mg/dL (ref 8.4–10.5)
CHLORIDE: 116 meq/L — AB (ref 96–112)
CO2: 15 meq/L — AB (ref 19–32)
CREATININE: 6.05 mg/dL — AB (ref 0.40–1.50)
GFR: 11.44 mL/min — CL (ref >60.00)
Glucose, Bld: 142 mg/dL — ABNORMAL HIGH (ref 70–99)
Phosphorus: 3.6 mg/dL (ref 2.3–4.6)
Potassium: 5.8 mEq/L — ABNORMAL HIGH (ref 3.5–5.1)
Sodium: 140 mEq/L (ref 135–145)

## 2016-10-01 MED ORDER — FUROSEMIDE 20 MG PO TABS: 20.0000 mg | ORAL_TABLET | Freq: Every day | ORAL | 1 refills | Status: DC

## 2016-10-01 MED ORDER — AMLODIPINE BESYLATE 10 MG PO TABS: 10.0000 mg | ORAL_TABLET | Freq: Every day | ORAL | 1 refills | Status: DC

## 2016-10-01 MED ORDER — CIPROFLOXACIN HCL 250 MG PO TABS: 250.0000 mg | ORAL_TABLET | Freq: Two times a day (BID) | ORAL | 0 refills | Status: DC

## 2016-10-01 NOTE — Assessment & Plan Note (Signed)
Blood pressure remains elevated above goal with current regimen and is not safe with current renal function levels. Discontinue losartan-hctz and start amlodipine and furosemide. Encouraged to monitor blood pressure at home and follow low sodium diet. Follow up with nephrology. Follow up primary care in 2 weeks for blood pressure check.

## 2016-10-01 NOTE — Progress Notes (Signed)
 Subjective:    Patient ID: Caleb Hayes, male    DOB: 12/20/1929, 83 y.o.   MRN: [id number]  Chief Complaint  Patient presents with  . Establish Care    has a catheter that needs to be changed states he has an appointment the first week in august with urology    HPI:  Caleb Hayes is a 83 y.o. male who  has a past medical history of Gout and Hypertension. and presents today for an office visit to establish care.   1.) Hypertension - Currently maintained on losartan-hydrochlorothiazide. Reports taken medications prescribed and denies adverse side effects or hypotensive readings. Takes his blood pressure irregularly and states it remains high. No symptoms of end organ damage. Working on a low sodium intake.  BP Readings from Last 3 Encounters:  10/01/16 (!) 162/78  09/25/16 (!) 216/83  08/28/16 (!) 197/71    2.) Chronic kidney disease - Previously diagnosed with chronic kidney disease with most recent eGFR of 8 and serum creatinine of 6.5 indicating Stage 5 kidney disease. Indicates he has a nephrologist but has not been seen in a while. Currently has a catheter in placed for urinary retention due to prostate enlargement and monitored by urology. Express foul odor and cloudy urine. Due for catheter change with Urology in 1 week.   No Known Allergies    Outpatient Medications Prior to Visit  Medication Sig Dispense Refill  . Cyanocobalamin (VITAMIN B-12 IJ) Inject 1 mL as directed every 30 (thirty) days.    . feeding supplement (ENSURE IMMUNE HEALTH) LIQD Take 237 mLs by mouth daily as needed (NUTRITIONAL).    . Multiple Vitamin (MULTIVITAMIN WITH MINERALS) TABS tablet Take 1 tablet by mouth daily.    . losartan-hydrochlorothiazide (HYZAAR) 100-25 MG tablet Take 1 tablet by mouth daily. 90 tablet 4   No facility-administered medications prior to visit.       No past surgical history on file.    Past Medical History:  Diagnosis Date  . Gout   . Hypertension       Review of Systems  Constitutional: Negative for chills and fever.  Eyes:       Negative for changes in vision  Respiratory: Negative for cough, chest tightness and wheezing.   Cardiovascular: Negative for chest pain, palpitations and leg swelling.  Neurological: Negative for dizziness, weakness and light-headedness.      Objective:    BP (!) 162/78 (BP Location: Left Arm, Patient Position: Sitting, Cuff Size: Normal)   Pulse 92   Temp 98.3 F (36.8 C) (Oral)   Resp 16   Ht 5' 6" (1.676 m)   Wt 157 lb (71.2 kg)   SpO2 97%   BMI 25.34 kg/m  Nursing note and vital signs reviewed.  Physical Exam  Constitutional: He is oriented to person, place, and time. He appears well-developed and well-nourished. No distress.  Cardiovascular: Normal rate, regular rhythm, normal heart sounds and intact distal pulses.   Pulmonary/Chest: Effort normal and breath sounds normal.  Neurological: He is alert and oriented to person, place, and time.  Skin: Skin is warm and dry.  Psychiatric: He has a normal mood and affect. His behavior is normal. Judgment and thought content normal.       Assessment & Plan:   Problem List Items Addressed This Visit      Cardiovascular and Mediastinum   Essential hypertension, benign - Primary    Blood pressure remains elevated above goal with current regimen and is   not safe with current renal function levels. Discontinue losartan-hctz and start amlodipine and furosemide. Encouraged to monitor blood pressure at home and follow low sodium diet. Follow up with nephrology. Follow up primary care in 2 weeks for blood pressure check.       Relevant Medications   amLODipine (NORVASC) 10 MG tablet   furosemide (LASIX) 20 MG tablet   Other Relevant Orders   Renal Function Panel (Completed)     Genitourinary   CKD stage 5 secondary to hypertension (HCC)    Obtain renal panel. Most recent blood work concerning for progressive renal failure. Will discontinue  blood pressure medications secondary to decreased renal function. Recommend follow up with nephrology for further assessment and treatment.           I have discontinued Mr. Redinger's losartan-hydrochlorothiazide. I am also having him start on amLODipine, furosemide, and ciprofloxacin. Additionally, I am having him maintain his multivitamin with minerals, Cyanocobalamin (VITAMIN B-12 IJ), and feeding supplement.   Meds ordered this encounter  Medications  . amLODipine (NORVASC) 10 MG tablet    Sig: Take 1 tablet (10 mg total) by mouth daily.    Dispense:  30 tablet    Refill:  1    Order Specific Question:   Supervising Provider    Answer:   CRAWFORD, ELIZABETH A [[id number]]  . furosemide (LASIX) 20 MG tablet    Sig: Take 1 tablet (20 mg total) by mouth daily.    Dispense:  30 tablet    Refill:  1    Order Specific Question:   Supervising Provider    Answer:   CRAWFORD, ELIZABETH A [[id number]]  . ciprofloxacin (CIPRO) 250 MG tablet    Sig: Take 1 tablet (250 mg total) by mouth 2 (two) times daily.    Dispense:  6 tablet    Refill:  0    Order Specific Question:   Supervising Provider    Answer:   CRAWFORD, ELIZABETH A [[id number]]     Follow-up: Return in about 2 weeks (around 10/15/2016), or if symptoms worsen or fail to improve.  , , FNP   

## 2016-10-01 NOTE — Telephone Encounter (Signed)
Critical lab value of GFR-11.44 and Creatinine-6.05. Please advise

## 2016-10-01 NOTE — Assessment & Plan Note (Signed)
Obtain renal panel. Most recent blood work concerning for progressive renal failure. Will discontinue blood pressure medications secondary to decreased renal function. Recommend follow up with nephrology for further assessment and treatment.

## 2016-10-01 NOTE — Telephone Encounter (Signed)
Patient with known kidney disease and advised to follow up with Nephrology during office visit.

## 2016-10-01 NOTE — Patient Instructions (Signed)
Thank you for choosing [facility].  SUMMARY AND INSTRUCTIONS:  Please STOP taking the losartan-hctz.  Please START taking AMLODIPINE and FUROSEMIDE  Continue to monitor your blood pressure at home.  Follow up with Nephrology and Urology as scheduled.   Schedule a time for your wellness exam at your convenience.  We will check your kidney function today.  Follow up in 2 weeks for a blood pressure check with a nurse visit.  Medication:  Your prescription(s) have been submitted to your pharmacy or been printed and provided for you. Please take as directed and contact our office if you believe you are having problem(s) with the medication(s) or have any questions.  Labs:  Please stop by the lab on the lower level of the building for your blood work. Your results will be released to [place] (or called to you) after review, usually within 72 hours after test completion. If any changes need to be made, you will be notified at that same time.  1.) The lab is open from 7:30am to 5:30 pm Monday-Friday 2.) No appointment is necessary 3.) Fasting (if needed) is 6-8 hours after food and drink; black coffee and water are okay   Follow up:  If your symptoms worsen or fail to improve, please contact our office for further instruction, or in case of emergency go directly to the emergency room at the closest medical facility.

## 2016-10-08 ENCOUNTER — Telehealth: Payer: Self-pay | Admitting: Family

## 2016-10-08 NOTE — Telephone Encounter (Signed)
Daughter requesting call back with lab results once reviewed.  Daughter is on [place].

## 2016-10-08 NOTE — Telephone Encounter (Signed)
Blood work shows that his kidney function remains low with a GFR at 11 and his creatinine and potassium are improved slightly. Recommend follow up with nephrology as we discussed.

## 2016-10-08 NOTE — Telephone Encounter (Signed)
Please advise on results when you receive them.

## 2016-10-09 DIAGNOSIS — R338 Other retention of urine: Secondary | ICD-10-CM | POA: Diagnosis not present

## 2016-10-09 NOTE — Telephone Encounter (Signed)
Pts daughter aware of results. Will follow up with nephrology. Coming for bp check on 8/7 and will make a follow up with [place].

## 2016-10-15 ENCOUNTER — Ambulatory Visit: Payer: Medicare Other | Admitting: General Practice

## 2016-10-15 VITALS — BP 138/68

## 2016-10-15 DIAGNOSIS — I10 Essential (primary) hypertension: Secondary | ICD-10-CM

## 2016-10-22 ENCOUNTER — Other Ambulatory Visit: Payer: Self-pay | Admitting: *Deleted

## 2016-10-22 DIAGNOSIS — N179 Acute kidney failure, unspecified: Secondary | ICD-10-CM

## 2016-10-22 DIAGNOSIS — N189 Chronic kidney disease, unspecified: Secondary | ICD-10-CM

## 2016-10-22 DIAGNOSIS — D510 Vitamin B12 deficiency anemia due to intrinsic factor deficiency: Secondary | ICD-10-CM

## 2016-10-22 DIAGNOSIS — D649 Anemia, unspecified: Secondary | ICD-10-CM

## 2016-10-23 ENCOUNTER — Telehealth: Payer: Self-pay

## 2016-10-23 ENCOUNTER — Ambulatory Visit (HOSPITAL_BASED_OUTPATIENT_CLINIC_OR_DEPARTMENT_OTHER): Payer: Medicare Other

## 2016-10-23 ENCOUNTER — Other Ambulatory Visit (HOSPITAL_BASED_OUTPATIENT_CLINIC_OR_DEPARTMENT_OTHER): Payer: Medicare Other

## 2016-10-23 ENCOUNTER — Other Ambulatory Visit: Payer: Self-pay | Admitting: Oncology

## 2016-10-23 VITALS — BP 165/70 | HR 63 | Temp 97.5°F | Resp 20

## 2016-10-23 DIAGNOSIS — N179 Acute kidney failure, unspecified: Secondary | ICD-10-CM

## 2016-10-23 DIAGNOSIS — N189 Chronic kidney disease, unspecified: Secondary | ICD-10-CM

## 2016-10-23 DIAGNOSIS — D631 Anemia in chronic kidney disease: Secondary | ICD-10-CM

## 2016-10-23 DIAGNOSIS — D510 Vitamin B12 deficiency anemia due to intrinsic factor deficiency: Secondary | ICD-10-CM

## 2016-10-23 DIAGNOSIS — D649 Anemia, unspecified: Secondary | ICD-10-CM

## 2016-10-23 LAB — COMPREHENSIVE METABOLIC PANEL
ALBUMIN: 3.3 g/dL — AB (ref 3.5–5.0)
ALT: 12 U/L (ref 0–55)
ANION GAP: 9 meq/L (ref 3–11)
AST: 18 U/L (ref 5–34)
Alkaline Phosphatase: 127 U/L (ref 40–150)
BILIRUBIN TOTAL: 0.41 mg/dL (ref 0.20–1.20)
BUN: 52.4 mg/dL — AB (ref 7.0–26.0)
CALCIUM: 9.4 mg/dL (ref 8.4–10.4)
CHLORIDE: 113 meq/L — AB (ref 98–109)
CO2: 16 mEq/L — ABNORMAL LOW (ref 22–29)
CREATININE: 6.5 mg/dL — AB (ref 0.7–1.3)
EGFR: 8 mL/min/{1.73_m2} — ABNORMAL LOW (ref >90)
Glucose: 92 mg/dl (ref 70–140)
Potassium: 5.9 mEq/L — ABNORMAL HIGH (ref 3.5–5.1)
Sodium: 139 mEq/L (ref 136–145)
TOTAL PROTEIN: 7.7 g/dL (ref 6.4–8.3)

## 2016-10-23 LAB — CBC WITH DIFFERENTIAL/PLATELET
BASO%: 1.4 % (ref 0.0–2.0)
Basophils Absolute: 0.1 10*3/uL (ref 0.0–0.1)
EOS%: 6.8 % (ref 0.0–7.0)
Eosinophils Absolute: 0.4 10*3/uL (ref 0.0–0.5)
HEMATOCRIT: 31 % — AB (ref 38.4–49.9)
HEMOGLOBIN: 10.4 g/dL — AB (ref 13.0–17.1)
LYMPH#: 2.7 10*3/uL (ref 0.9–3.3)
LYMPH%: 41.8 % (ref 14.0–49.0)
MCH: 29 pg (ref 27.2–33.4)
MCHC: 33.6 g/dL (ref 32.0–36.0)
MCV: 86.2 fL (ref 79.3–98.0)
MONO#: 0.4 10*3/uL (ref 0.1–0.9)
MONO%: 6.2 % (ref 0.0–14.0)
NEUT%: 43.8 % (ref 39.0–75.0)
NEUTROS ABS: 2.8 10*3/uL (ref 1.5–6.5)
PLATELETS: 294 10*3/uL (ref 140–400)
RBC: 3.6 10*6/uL — ABNORMAL LOW (ref 4.20–5.82)
RDW: 14.5 % (ref 11.0–14.6)
WBC: 6.4 10*3/uL (ref 4.0–10.3)

## 2016-10-23 MED ORDER — DARBEPOETIN ALFA 300 MCG/0.6ML IJ SOSY
300.0000 ug | PREFILLED_SYRINGE | Freq: Once | INTRAMUSCULAR | Status: AC
  Administered 2016-10-23: 300 ug via SUBCUTANEOUS
  Filled 2016-10-23: qty 0.6

## 2016-10-23 MED ORDER — CYANOCOBALAMIN 1000 MCG/ML IJ SOLN
1000.0000 ug | Freq: Once | INTRAMUSCULAR | Status: AC
  Administered 2016-10-23: 1000 ug via INTRAMUSCULAR

## 2016-10-23 NOTE — Patient Instructions (Signed)
Cyanocobalamin, Vitamin B12 injection What is this medicine? CYANOCOBALAMIN (sye an oh koe BAL a min) is a man made form of vitamin B12. Vitamin B12 is used in the growth of healthy blood cells, nerve cells, and proteins in the body. It also helps with the metabolism of fats and carbohydrates. This medicine is used to treat people who can not absorb vitamin B12. This medicine may be used for other purposes; ask your health care provider or pharmacist if you have questions. COMMON BRAND NAME(S): B-12 Compliance Kit, B-12 Injection Kit, Cyomin, LA-12, Nutri-Twelve, Physicians EZ Use B-12, Primabalt What should I tell my health care provider before I take this medicine? They need to know if you have any of these conditions: -kidney disease -Leber's disease -megaloblastic anemia -an unusual or allergic reaction to cyanocobalamin, cobalt, other medicines, foods, dyes, or preservatives -pregnant or trying to get pregnant -breast-feeding How should I use this medicine? This medicine is injected into a muscle or deeply under the skin. It is usually given by a health care professional in a clinic or doctor's office. However, your doctor may teach you how to inject yourself. Follow all instructions. Talk to your pediatrician regarding the use of this medicine in children. Special care may be needed. Overdosage: If you think you have taken too much of this medicine contact a poison control center or emergency room at once. NOTE: This medicine is only for you. Do not share this medicine with others. What if I miss a dose? If you are given your dose at a clinic or doctor's office, call to reschedule your appointment. If you give your own injections and you miss a dose, take it as soon as you can. If it is almost time for your next dose, take only that dose. Do not take double or extra doses. What may interact with this medicine? -colchicine -heavy alcohol intake This list may not describe all possible  interactions. Give your health care provider a list of all the medicines, herbs, non-prescription drugs, or dietary supplements you use. Also tell them if you smoke, drink alcohol, or use illegal drugs. Some items may interact with your medicine. What should I watch for while using this medicine? Visit your doctor or health care professional regularly. You may need blood work done while you are taking this medicine. You may need to follow a special diet. Talk to your doctor. Limit your alcohol intake and avoid smoking to get the best benefit. What side effects may I notice from receiving this medicine? Side effects that you should report to your doctor or health care professional as soon as possible: -allergic reactions like skin rash, itching or hives, swelling of the face, lips, or tongue -blue tint to skin -chest tightness, pain -difficulty breathing, wheezing -dizziness -red, swollen painful area on the leg Side effects that usually do not require medical attention (report to your doctor or health care professional if they continue or are bothersome): -diarrhea -headache This list may not describe all possible side effects. Call your doctor for medical advice about side effects. You may report side effects to FDA at 1-800-FDA-1088. Where should I keep my medicine? Keep out of the reach of children. Store at room temperature between 15 and 30 degrees C (59 and 85 degrees F). Protect from light. Throw away any unused medicine after the expiration date. NOTE: This sheet is a summary. It may not cover all possible information. If you have questions about this medicine, talk to your doctor, pharmacist, or   health care provider.  2018 Elsevier/Gold Standard (2007-06-08 22:10:20) Darbepoetin Alfa injection What is this medicine? DARBEPOETIN ALFA (dar be POE e tin AL fa) helps your body make more red blood cells. It is used to treat anemia caused by chronic kidney failure and chemotherapy. This  medicine may be used for other purposes; ask your health care provider or pharmacist if you have questions. COMMON BRAND NAME(S): Aranesp What should I tell my health care provider before I take this medicine? They need to know if you have any of these conditions: -blood clotting disorders or history of blood clots -cancer patient not on chemotherapy -cystic fibrosis -heart disease, such as angina, heart failure, or a history of a heart attack -hemoglobin level of 12 g/dL or greater -high blood pressure -low levels of folate, iron, or vitamin B12 -seizures -an unusual or allergic reaction to darbepoetin, erythropoietin, albumin, hamster proteins, latex, other medicines, foods, dyes, or preservatives -pregnant or trying to get pregnant -breast-feeding How should I use this medicine? This medicine is for injection into a vein or under the skin. It is usually given by a health care professional in a hospital or clinic setting. If you get this medicine at home, you will be taught how to prepare and give this medicine. Use exactly as directed. Take your medicine at regular intervals. Do not take your medicine more often than directed. It is important that you put your used needles and syringes in a special sharps container. Do not put them in a trash can. If you do not have a sharps container, call your pharmacist or healthcare provider to get one. A special MedGuide will be given to you by the pharmacist with each prescription and refill. Be sure to read this information carefully each time. Talk to your pediatrician regarding the use of this medicine in children. While this medicine may be used in children as young as 1 year for selected conditions, precautions do apply. Overdosage: If you think you have taken too much of this medicine contact a poison control center or emergency room at once. NOTE: This medicine is only for you. Do not share this medicine with others. What if I miss a dose? If  you miss a dose, take it as soon as you can. If it is almost time for your next dose, take only that dose. Do not take double or extra doses. What may interact with this medicine? Do not take this medicine with any of the following medications: -epoetin alfa This list may not describe all possible interactions. Give your health care provider a list of all the medicines, herbs, non-prescription drugs, or dietary supplements you use. Also tell them if you smoke, drink alcohol, or use illegal drugs. Some items may interact with your medicine. What should I watch for while using this medicine? Your condition will be monitored carefully while you are receiving this medicine. You may need blood work done while you are taking this medicine. What side effects may I notice from receiving this medicine? Side effects that you should report to your doctor or health care professional as soon as possible: -allergic reactions like skin rash, itching or hives, swelling of the face, lips, or tongue -breathing problems -changes in vision -chest pain -confusion, trouble speaking or understanding -feeling faint or lightheaded, falls -high blood pressure -muscle aches or pains -pain, swelling, warmth in the leg -rapid weight gain -severe headaches -sudden numbness or weakness of the face, arm or leg -trouble walking, dizziness, loss of balance   or coordination -seizures (convulsions) -swelling of the ankles, feet, hands -unusually weak or tired Side effects that usually do not require medical attention (report to your doctor or health care professional if they continue or are bothersome): -diarrhea -fever, chills (flu-like symptoms) -headaches -nausea, vomiting -redness, stinging, or swelling at site where injected This list may not describe all possible side effects. Call your doctor for medical advice about side effects. You may report side effects to FDA at 1-800-FDA-1088. Where should I keep my  medicine? Keep out of the reach of children. Store in a refrigerator between 2 and 8 degrees C (36 and 46 degrees F). Do not freeze. Do not shake. Throw away any unused portion if using a single-dose vial. Throw away any unused medicine after the expiration date. NOTE: This sheet is a summary. It may not cover all possible information. If you have questions about this medicine, talk to your doctor, pharmacist, or health care provider.  2018 Elsevier/Gold Standard (2015-10-16 19:52:26)

## 2016-10-23 NOTE — Telephone Encounter (Signed)
CMP faxed to Mauricio Po, [place] at [facility]

## 2016-11-12 DIAGNOSIS — R338 Other retention of urine: Secondary | ICD-10-CM | POA: Diagnosis not present

## 2016-11-20 ENCOUNTER — Ambulatory Visit (HOSPITAL_BASED_OUTPATIENT_CLINIC_OR_DEPARTMENT_OTHER): Payer: Medicare Other

## 2016-11-20 ENCOUNTER — Other Ambulatory Visit (HOSPITAL_BASED_OUTPATIENT_CLINIC_OR_DEPARTMENT_OTHER): Payer: Medicare Other

## 2016-11-20 VITALS — BP 180/70 | HR 68 | Temp 97.6°F | Resp 16

## 2016-11-20 DIAGNOSIS — N179 Acute kidney failure, unspecified: Secondary | ICD-10-CM

## 2016-11-20 DIAGNOSIS — D649 Anemia, unspecified: Secondary | ICD-10-CM

## 2016-11-20 DIAGNOSIS — N189 Chronic kidney disease, unspecified: Secondary | ICD-10-CM | POA: Diagnosis not present

## 2016-11-20 DIAGNOSIS — D510 Vitamin B12 deficiency anemia due to intrinsic factor deficiency: Secondary | ICD-10-CM

## 2016-11-20 DIAGNOSIS — D631 Anemia in chronic kidney disease: Secondary | ICD-10-CM | POA: Diagnosis not present

## 2016-11-20 LAB — CBC WITH DIFFERENTIAL/PLATELET
BASO%: 1.3 % (ref 0.0–2.0)
Basophils Absolute: 0.1 10*3/uL (ref 0.0–0.1)
EOS ABS: 0.3 10*3/uL (ref 0.0–0.5)
EOS%: 4.9 % (ref 0.0–7.0)
HEMATOCRIT: 36.7 % — AB (ref 38.4–49.9)
HGB: 12.3 g/dL — ABNORMAL LOW (ref 13.0–17.1)
LYMPH%: 43.2 % (ref 14.0–49.0)
MCH: 29.1 pg (ref 27.2–33.4)
MCHC: 33.5 g/dL (ref 32.0–36.0)
MCV: 87 fL (ref 79.3–98.0)
MONO#: 0.3 10*3/uL (ref 0.1–0.9)
MONO%: 5.1 % (ref 0.0–14.0)
NEUT#: 2.9 10*3/uL (ref 1.5–6.5)
NEUT%: 45.5 % (ref 39.0–75.0)
PLATELETS: 244 10*3/uL (ref 140–400)
RBC: 4.22 10*6/uL (ref 4.20–5.82)
RDW: 14.7 % — ABNORMAL HIGH (ref 11.0–14.6)
WBC: 6.3 10*3/uL (ref 4.0–10.3)
lymph#: 2.7 10*3/uL (ref 0.9–3.3)
nRBC: 0 % (ref 0–0)

## 2016-11-20 MED ORDER — CYANOCOBALAMIN 1000 MCG/ML IJ SOLN
1000.0000 ug | Freq: Once | INTRAMUSCULAR | Status: AC
  Administered 2016-11-20: 1000 ug via INTRAMUSCULAR

## 2016-11-20 NOTE — Patient Instructions (Signed)

## 2016-12-10 DIAGNOSIS — R338 Other retention of urine: Secondary | ICD-10-CM | POA: Diagnosis not present

## 2016-12-18 ENCOUNTER — Ambulatory Visit: Payer: Medicare Other

## 2016-12-18 ENCOUNTER — Other Ambulatory Visit: Payer: Medicare Other

## 2017-01-07 DIAGNOSIS — R338 Other retention of urine: Secondary | ICD-10-CM | POA: Diagnosis not present

## 2017-01-15 ENCOUNTER — Ambulatory Visit (HOSPITAL_BASED_OUTPATIENT_CLINIC_OR_DEPARTMENT_OTHER): Payer: Medicare Other

## 2017-01-15 ENCOUNTER — Other Ambulatory Visit (HOSPITAL_BASED_OUTPATIENT_CLINIC_OR_DEPARTMENT_OTHER): Payer: Medicare Other

## 2017-01-15 VITALS — BP 188/88 | HR 79 | Temp 97.5°F | Resp 18

## 2017-01-15 DIAGNOSIS — N179 Acute kidney failure, unspecified: Secondary | ICD-10-CM

## 2017-01-15 DIAGNOSIS — D649 Anemia, unspecified: Secondary | ICD-10-CM

## 2017-01-15 DIAGNOSIS — D510 Vitamin B12 deficiency anemia due to intrinsic factor deficiency: Secondary | ICD-10-CM | POA: Diagnosis not present

## 2017-01-15 DIAGNOSIS — N189 Chronic kidney disease, unspecified: Secondary | ICD-10-CM

## 2017-01-15 LAB — CBC WITH DIFFERENTIAL/PLATELET
BASO%: 1.2 % (ref 0.0–2.0)
Basophils Absolute: 0.1 10*3/uL (ref 0.0–0.1)
EOS ABS: 0.3 10*3/uL (ref 0.0–0.5)
EOS%: 4.1 % (ref 0.0–7.0)
HCT: 37.9 % — ABNORMAL LOW (ref 38.4–49.9)
HGB: 12.7 g/dL — ABNORMAL LOW (ref 13.0–17.1)
LYMPH%: 36.6 % (ref 14.0–49.0)
MCH: 28.9 pg (ref 27.2–33.4)
MCHC: 33.5 g/dL (ref 32.0–36.0)
MCV: 86.3 fL (ref 79.3–98.0)
MONO#: 0.5 10*3/uL (ref 0.1–0.9)
MONO%: 5.9 % (ref 0.0–14.0)
NEUT%: 52.2 % (ref 39.0–75.0)
NEUTROS ABS: 4.3 10*3/uL (ref 1.5–6.5)
PLATELETS: 253 10*3/uL (ref 140–400)
RBC: 4.39 10*6/uL (ref 4.20–5.82)
RDW: 15.2 % — ABNORMAL HIGH (ref 11.0–14.6)
WBC: 8.3 10*3/uL (ref 4.0–10.3)
lymph#: 3 10*3/uL (ref 0.9–3.3)
nRBC: 0 % (ref 0–0)

## 2017-01-15 MED ORDER — CYANOCOBALAMIN 1000 MCG/ML IJ SOLN
1000.0000 ug | Freq: Once | INTRAMUSCULAR | Status: AC
  Administered 2017-01-15: 1000 ug via INTRAMUSCULAR

## 2017-01-15 NOTE — Patient Instructions (Signed)

## 2017-02-04 DIAGNOSIS — R338 Other retention of urine: Secondary | ICD-10-CM | POA: Diagnosis not present

## 2017-02-12 ENCOUNTER — Ambulatory Visit (HOSPITAL_BASED_OUTPATIENT_CLINIC_OR_DEPARTMENT_OTHER): Payer: Medicare Other

## 2017-02-12 ENCOUNTER — Other Ambulatory Visit (HOSPITAL_BASED_OUTPATIENT_CLINIC_OR_DEPARTMENT_OTHER): Payer: Medicare Other

## 2017-02-12 VITALS — BP 185/89 | HR 74 | Temp 98.0°F | Resp 20

## 2017-02-12 DIAGNOSIS — N179 Acute kidney failure, unspecified: Secondary | ICD-10-CM

## 2017-02-12 DIAGNOSIS — N189 Chronic kidney disease, unspecified: Secondary | ICD-10-CM

## 2017-02-12 DIAGNOSIS — D649 Anemia, unspecified: Secondary | ICD-10-CM

## 2017-02-12 DIAGNOSIS — D510 Vitamin B12 deficiency anemia due to intrinsic factor deficiency: Secondary | ICD-10-CM

## 2017-02-12 LAB — CBC WITH DIFFERENTIAL/PLATELET
BASO%: 0.7 % (ref 0.0–2.0)
Basophils Absolute: 0.1 10*3/uL (ref 0.0–0.1)
EOS ABS: 0.4 10*3/uL (ref 0.0–0.5)
EOS%: 5.9 % (ref 0.0–7.0)
HCT: 32.8 % — ABNORMAL LOW (ref 38.4–49.9)
HGB: 10.8 g/dL — ABNORMAL LOW (ref 13.0–17.1)
LYMPH%: 39.1 % (ref 14.0–49.0)
MCH: 28.2 pg (ref 27.2–33.4)
MCHC: 32.9 g/dL (ref 32.0–36.0)
MCV: 85.6 fL (ref 79.3–98.0)
MONO#: 0.4 10*3/uL (ref 0.1–0.9)
MONO%: 5.2 % (ref 0.0–14.0)
NEUT#: 3.3 10*3/uL (ref 1.5–6.5)
NEUT%: 49.1 % (ref 39.0–75.0)
PLATELETS: 221 10*3/uL (ref 140–400)
RBC: 3.83 10*6/uL — AB (ref 4.20–5.82)
RDW: 15.8 % — ABNORMAL HIGH (ref 11.0–14.6)
WBC: 6.7 10*3/uL (ref 4.0–10.3)
lymph#: 2.6 10*3/uL (ref 0.9–3.3)

## 2017-02-12 MED ORDER — CYANOCOBALAMIN 1000 MCG/ML IJ SOLN
1000.0000 ug | Freq: Once | INTRAMUSCULAR | Status: AC
  Administered 2017-02-12: 1000 ug via INTRAMUSCULAR

## 2017-02-12 NOTE — Patient Instructions (Signed)

## 2017-02-25 DIAGNOSIS — R338 Other retention of urine: Secondary | ICD-10-CM | POA: Diagnosis not present

## 2017-03-12 ENCOUNTER — Other Ambulatory Visit (HOSPITAL_BASED_OUTPATIENT_CLINIC_OR_DEPARTMENT_OTHER): Payer: Medicare Other

## 2017-03-12 ENCOUNTER — Ambulatory Visit (HOSPITAL_BASED_OUTPATIENT_CLINIC_OR_DEPARTMENT_OTHER): Payer: Medicare Other

## 2017-03-12 VITALS — BP 188/89 | HR 80 | Temp 98.0°F | Resp 18

## 2017-03-12 DIAGNOSIS — N179 Acute kidney failure, unspecified: Secondary | ICD-10-CM

## 2017-03-12 DIAGNOSIS — D510 Vitamin B12 deficiency anemia due to intrinsic factor deficiency: Secondary | ICD-10-CM

## 2017-03-12 DIAGNOSIS — N189 Chronic kidney disease, unspecified: Secondary | ICD-10-CM

## 2017-03-12 DIAGNOSIS — D649 Anemia, unspecified: Secondary | ICD-10-CM

## 2017-03-12 LAB — CBC WITH DIFFERENTIAL/PLATELET
BASO%: 1.1 % (ref 0.0–2.0)
BASOS ABS: 0.1 10*3/uL (ref 0.0–0.1)
EOS ABS: 0.5 10*3/uL (ref 0.0–0.5)
EOS%: 6.1 % (ref 0.0–7.0)
HCT: 32.5 % — ABNORMAL LOW (ref 38.4–49.9)
HGB: 10.8 g/dL — ABNORMAL LOW (ref 13.0–17.1)
LYMPH%: 28.2 % (ref 14.0–49.0)
MCH: 28.9 pg (ref 27.2–33.4)
MCHC: 33.4 g/dL (ref 32.0–36.0)
MCV: 86.7 fL (ref 79.3–98.0)
MONO#: 0.6 10*3/uL (ref 0.1–0.9)
MONO%: 8 % (ref 0.0–14.0)
NEUT#: 4.5 10*3/uL (ref 1.5–6.5)
NEUT%: 56.6 % (ref 39.0–75.0)
PLATELETS: 255 10*3/uL (ref 140–400)
RBC: 3.75 10*6/uL — AB (ref 4.20–5.82)
RDW: 15.9 % — ABNORMAL HIGH (ref 11.0–14.6)
WBC: 7.9 10*3/uL (ref 4.0–10.3)
lymph#: 2.2 10*3/uL (ref 0.9–3.3)

## 2017-03-12 MED ORDER — CYANOCOBALAMIN 1000 MCG/ML IJ SOLN
1000.0000 ug | Freq: Once | INTRAMUSCULAR | Status: AC
  Administered 2017-03-12: 1000 ug via INTRAMUSCULAR

## 2017-03-12 MED ORDER — DARBEPOETIN ALFA 300 MCG/0.6ML IJ SOSY
PREFILLED_SYRINGE | INTRAMUSCULAR | Status: AC
  Filled 2017-03-12: qty 0.6

## 2017-03-12 NOTE — Patient Instructions (Signed)

## 2017-03-25 DIAGNOSIS — R338 Other retention of urine: Secondary | ICD-10-CM | POA: Diagnosis not present

## 2017-04-22 DIAGNOSIS — R338 Other retention of urine: Secondary | ICD-10-CM | POA: Diagnosis not present

## 2017-05-20 DIAGNOSIS — R338 Other retention of urine: Secondary | ICD-10-CM | POA: Diagnosis not present

## 2017-06-17 DIAGNOSIS — R338 Other retention of urine: Secondary | ICD-10-CM | POA: Diagnosis not present

## 2017-07-02 ENCOUNTER — Ambulatory Visit: Payer: Medicare Other

## 2017-07-02 ENCOUNTER — Ambulatory Visit: Payer: Medicare Other | Admitting: Oncology

## 2017-07-02 ENCOUNTER — Other Ambulatory Visit: Payer: Medicare Other

## 2017-07-07 ENCOUNTER — Telehealth: Payer: Self-pay | Admitting: Adult Health

## 2017-07-07 NOTE — Telephone Encounter (Signed)
Patient called to verify time  °

## 2017-07-08 ENCOUNTER — Inpatient Hospital Stay: Payer: Medicare Other

## 2017-07-08 ENCOUNTER — Inpatient Hospital Stay: Payer: Medicare Other | Attending: Oncology | Admitting: Adult Health

## 2017-07-08 ENCOUNTER — Telehealth: Payer: Self-pay | Admitting: Adult Health

## 2017-07-08 VITALS — BP 214/94 | HR 67 | Temp 98.5°F | Resp 18 | Ht 66.0 in | Wt 162.9 lb

## 2017-07-08 DIAGNOSIS — D649 Anemia, unspecified: Secondary | ICD-10-CM

## 2017-07-08 DIAGNOSIS — D631 Anemia in chronic kidney disease: Secondary | ICD-10-CM

## 2017-07-08 DIAGNOSIS — D510 Vitamin B12 deficiency anemia due to intrinsic factor deficiency: Secondary | ICD-10-CM | POA: Diagnosis not present

## 2017-07-08 DIAGNOSIS — I10 Essential (primary) hypertension: Secondary | ICD-10-CM | POA: Diagnosis not present

## 2017-07-08 DIAGNOSIS — I129 Hypertensive chronic kidney disease with stage 1 through stage 4 chronic kidney disease, or unspecified chronic kidney disease: Secondary | ICD-10-CM | POA: Diagnosis not present

## 2017-07-08 DIAGNOSIS — M109 Gout, unspecified: Secondary | ICD-10-CM | POA: Diagnosis not present

## 2017-07-08 DIAGNOSIS — Z79899 Other long term (current) drug therapy: Secondary | ICD-10-CM

## 2017-07-08 DIAGNOSIS — Z8049 Family history of malignant neoplasm of other genital organs: Secondary | ICD-10-CM | POA: Diagnosis not present

## 2017-07-08 DIAGNOSIS — E538 Deficiency of other specified B group vitamins: Secondary | ICD-10-CM | POA: Diagnosis not present

## 2017-07-08 DIAGNOSIS — Z87891 Personal history of nicotine dependence: Secondary | ICD-10-CM | POA: Diagnosis not present

## 2017-07-08 DIAGNOSIS — N179 Acute kidney failure, unspecified: Secondary | ICD-10-CM

## 2017-07-08 DIAGNOSIS — N189 Chronic kidney disease, unspecified: Secondary | ICD-10-CM | POA: Insufficient documentation

## 2017-07-08 DIAGNOSIS — E875 Hyperkalemia: Secondary | ICD-10-CM | POA: Diagnosis not present

## 2017-07-08 DIAGNOSIS — Z86718 Personal history of other venous thrombosis and embolism: Secondary | ICD-10-CM

## 2017-07-08 LAB — CBC WITH DIFFERENTIAL/PLATELET
BASOS ABS: 0.1 10*3/uL (ref 0.0–0.1)
Basophils Relative: 1 %
Eosinophils Absolute: 0.5 10*3/uL (ref 0.0–0.5)
Eosinophils Relative: 8 %
HEMATOCRIT: 29.6 % — AB (ref 38.4–49.9)
HEMOGLOBIN: 9.9 g/dL — AB (ref 13.0–17.1)
Lymphocytes Relative: 37 %
Lymphs Abs: 2.2 10*3/uL (ref 0.9–3.3)
MCH: 29.3 pg (ref 27.2–33.4)
MCHC: 33.4 g/dL (ref 32.0–36.0)
MCV: 87.6 fL (ref 79.3–98.0)
Monocytes Absolute: 0.4 10*3/uL (ref 0.1–0.9)
Monocytes Relative: 6 %
NEUTROS ABS: 3 10*3/uL (ref 1.5–6.5)
NEUTROS PCT: 48 %
Platelets: 226 10*3/uL (ref 140–400)
RBC: 3.38 MIL/uL — AB (ref 4.20–5.82)
RDW: 14.7 % — ABNORMAL HIGH (ref 11.0–14.6)
WBC: 6.1 10*3/uL (ref 4.0–10.3)

## 2017-07-08 MED ORDER — CYANOCOBALAMIN 1000 MCG/ML IJ SOLN
1000.0000 ug | Freq: Once | INTRAMUSCULAR | Status: AC
  Administered 2017-07-08: 1000 ug via INTRAMUSCULAR

## 2017-07-08 NOTE — Patient Instructions (Signed)

## 2017-07-08 NOTE — Telephone Encounter (Signed)
Gave patient AVs and calendar of upcoming may though April 2020 appointments.

## 2017-07-08 NOTE — Progress Notes (Signed)
[facility]  [phone] [phone]     ID: Oriel Rumbold Furth OB: 1930-02-19  MR#: [id number]  QQV#:[id number]  PCP: [facility], FNP GYN:   SU:  OTHER MD:  CHIEF COMPLAINT: Pernicious anemia  CURRENT TREATMENT: B-12 supplementation, aranesp for anemia of renal failure   HX PRESENT ILLNESS: From the earlier summary note:  Mr. Eckert presented to the emergency room on 05/16/2013 with complaints of weakness, fatigue, and lower extremity pain. Exam was unrevealing but lab work showed a creatinine of 4.34 with BUN 89. Potassium, sodium, calcium, and total protein were normal. The white cell count was 3.7, platelet count 75,000 and hemoglobin 7.3 with an MCV of 98. Ferritin was 3619, folate 14.4, and B12 160. Bilateral venous Dopplers were consistent with acute deep venous thrombosis involving the right common femoral vein, right popliteal vein and the) [name]. On the left side there was a posterior tibial vein thrombus of indeterminate age.   We were consulted to further evaluate the cytopenias and rule out multiple myeloma. In addition there was a question of anticoagulation in this patient with thrombocytopenia. The patient's subsequet history is as detailed below  INTERVAL HISTORY: Mr. Novinger returns today for follow-up of his pernicious anemia and anemia of renal failure accompanied by his daughter, Doroteo Bradford.   REVIEW OF SYSTEMS: Mr. Hosking tells me he is doing well today.  He continues to have hypertension, and has declined to take any of his anti-hypertensive medications because he says they make him feel bad.  He realizes that he is at increased risk of stroke due to this.  He is mildly fatigued, but denies any other issues today.  A detailed ROS is otherwise non contributory today.    PAST MEDICAL HISTORY: Past Medical History:  Diagnosis Date  . Gout   . Hypertension     PAST SURGICAL HISTORY: No past surgical history on  file.  FAMILY HISTORY Family History  Problem Relation Age of Onset  . Cervical cancer Mother   . Diabetes Father   . Vision loss Paternal Grandfather     SOCIAL HISTORY:  The patient's daughter Apolonio Schneiders lives with him. She has a 3d shift job. The patient's sister lives nearby and "can come in" as needed.    ADVANCED DIRECTIVES: At his visit 07/04/2015 the patient was given advanced directive forms to complete and notarize. These were in fact completed but they are not yet scanned   HEALTH MAINTENANCE: Social History   Tobacco Use  . Smoking status: Former Research scientist (life sciences)  . Smokeless tobacco: Never Used  Substance Use Topics  . Alcohol use: No  . Drug use: No    No Known Allergies  Current Outpatient Medications  Medication Sig Dispense Refill  . amLODipine (NORVASC) 10 MG tablet Take 1 tablet (10 mg total) by mouth daily. (Patient taking differently: Take 10 mg by mouth daily. ) 30 tablet 1  . ciprofloxacin (CIPRO) 250 MG tablet Take 1 tablet (250 mg total) by mouth 2 (two) times daily. 6 tablet 0  . feeding supplement (ENSURE IMMUNE HEALTH) LIQD Take 237 mLs by mouth daily as needed (NUTRITIONAL).     No current facility-administered medications for this visit.     OBJECTIVE: elderly African American man In no acute distress Vitals:   07/08/17 1320  BP: (!) 214/94  Pulse: 67  Resp: 18  Temp: 98.5 F (36.9 C)  SpO2: 100%     Body mass index is 26.29 kg/m.  ECOG FS:1 - Symptomatic but completely ambulatory GENERAL: Patient is a well appearing older male in no acute distress HEENT:  Sclerae anicteric.  Oropharynx clear and moist. No ulcerations or evidence of oropharyngeal candidiasis. Neck is supple.  NODES:  No cervical, supraclavicular, or axillary lymphadenopathy palpated.  LUNGS:  Clear to auscultation bilaterally.  No wheezes or rhonchi. HEART:  Regular rate and rhythm. No murmur appreciated. ABDOMEN:  Soft, nontender.  Positive, normoactive bowel sounds. No  organomegaly palpated. MSK:  No focal spinal tenderness to palpation.  EXTREMITIES:  No peripheral edema.   SKIN:  Clear with no obvious rashes or skin changes.  NEURO:  Nonfocal. Well oriented.  Appropriate affect.    LAB RESULTS:  CMP     Component Value Date/Time   NA 139 10/23/2016 0915   K 5.9 No visable hemolysis (H) 10/23/2016 0915   CL 116 (H) 10/01/2016 1356   CO2 16 (L) 10/23/2016 0915   GLUCOSE 92 10/23/2016 0915   BUN 52.4 (H) 10/23/2016 0915   CREATININE 6.5 (HH) 10/23/2016 0915   CALCIUM 9.4 10/23/2016 0915   PROT 7.7 10/23/2016 0915   ALBUMIN 3.3 (L) 10/23/2016 0915   AST 18 10/23/2016 0915   ALT 12 10/23/2016 0915   ALKPHOS 127 10/23/2016 0915   BILITOT 0.41 10/23/2016 0915   GFRNONAA 10 (L) 11/15/2015 0545   GFRAA 11 (L) 11/15/2015 0545    I No results found for: SPEP  Lab Results  Component Value Date   WBC 6.1 07/08/2017   NEUTROABS 3.0 07/08/2017   HGB 9.9 (L) 07/08/2017   HCT 29.6 (L) 07/08/2017   MCV 87.6 07/08/2017   PLT 226 07/08/2017    _0 @  No results found for: LABCA2  No components found for: [id number]  No results for input(s): INR in the last 168 hours.  Urinalysis    Component Value Date/Time   COLORURINE RED (A) 11/11/2015 1310   APPEARANCEUR TURBID (A) 11/11/2015 1310   LABSPEC 1.028 11/11/2015 1310   PHURINE 7.5 11/11/2015 1310   GLUCOSEU 100 (A) 11/11/2015 1310   HGBUR LARGE (A) 11/11/2015 1310   BILIRUBINUR NEGATIVE 11/11/2015 1310   KETONESUR NEGATIVE 11/11/2015 1310   PROTEINUR >300 (A) 11/11/2015 1310   UROBILINOGEN 0.2 08/05/2014 1446   NITRITE NEGATIVE 11/11/2015 1310   LEUKOCYTESUR MODERATE (A) 11/11/2015 1310    STUDIES: EKGs Today showed bradycardia but were otherwise unremarkable  ASSESSMENT: 84 y.o. year old [place] man   (1) pernicious anemia with intrinsic factor antibodies documented March 2015  (2) renal insufficiency with negative myeloma workup   (3) Right lower extremity DVT  March 2015, treated with coumadin for 6 months  (4) poorly controlled hypertension  (5) hyperkalemia  PLAN: Mr. Capano  Is doing moderately well today.  I reviewed his hypertension and due to the level and persistence of his hypertension, along with his refusal to take anti hypertensives, we reviewed this puts him at increased risk of stroke.  The Aranesp will increase his risk for stroke.  His hemoglobin is 9.9 today, but we will not give Aranesp today due his blood pressure.  This was reviewed with Dr. Lindi Adie.  Mr. Gains will continue with B12 injections.    Mr. lesesne will return monthly for lab and injections and in 1 year for f/u with Dr. Jana Hakim.  He knows to call for any questions or concerns prior to his next appointment with [place].    A total of (30) minutes of face-to-face time was spent with this  patient with greater than 50% of that time in counseling and care-coordination.     Scot Dock, NP   07/08/2017 1:47 PM

## 2017-07-09 ENCOUNTER — Encounter: Payer: Self-pay | Admitting: Adult Health

## 2017-07-15 DIAGNOSIS — R338 Other retention of urine: Secondary | ICD-10-CM | POA: Diagnosis not present

## 2017-08-05 ENCOUNTER — Inpatient Hospital Stay: Payer: Medicare Other | Attending: Oncology

## 2017-08-05 ENCOUNTER — Inpatient Hospital Stay: Payer: Medicare Other

## 2017-08-05 VITALS — BP 190/88 | HR 64 | Temp 97.8°F | Resp 20

## 2017-08-05 DIAGNOSIS — D510 Vitamin B12 deficiency anemia due to intrinsic factor deficiency: Secondary | ICD-10-CM | POA: Diagnosis not present

## 2017-08-05 DIAGNOSIS — Z79899 Other long term (current) drug therapy: Secondary | ICD-10-CM | POA: Diagnosis not present

## 2017-08-05 DIAGNOSIS — D649 Anemia, unspecified: Secondary | ICD-10-CM

## 2017-08-05 DIAGNOSIS — N189 Chronic kidney disease, unspecified: Secondary | ICD-10-CM

## 2017-08-05 DIAGNOSIS — N179 Acute kidney failure, unspecified: Secondary | ICD-10-CM

## 2017-08-05 LAB — CBC WITH DIFFERENTIAL/PLATELET
BASOS ABS: 0.1 10*3/uL (ref 0.0–0.1)
Basophils Relative: 1 %
EOS ABS: 0.6 10*3/uL — AB (ref 0.0–0.5)
EOS PCT: 8 %
HCT: 31.8 % — ABNORMAL LOW (ref 38.4–49.9)
HEMOGLOBIN: 10.6 g/dL — AB (ref 13.0–17.1)
LYMPHS ABS: 2.8 10*3/uL (ref 0.9–3.3)
LYMPHS PCT: 38 %
MCH: 29.4 pg (ref 27.2–33.4)
MCHC: 33.3 g/dL (ref 32.0–36.0)
MCV: 88.1 fL (ref 79.3–98.0)
Monocytes Absolute: 0.4 10*3/uL (ref 0.1–0.9)
Monocytes Relative: 6 %
NEUTROS PCT: 47 %
Neutro Abs: 3.5 10*3/uL (ref 1.5–6.5)
PLATELETS: 261 10*3/uL (ref 140–400)
RBC: 3.61 MIL/uL — AB (ref 4.20–5.82)
RDW: 14.6 % (ref 11.0–14.6)
WBC: 7.3 10*3/uL (ref 4.0–10.3)

## 2017-08-05 MED ORDER — CYANOCOBALAMIN 1000 MCG/ML IJ SOLN
1000.0000 ug | Freq: Once | INTRAMUSCULAR | Status: AC
  Administered 2017-08-05: 1000 ug via INTRAMUSCULAR

## 2017-08-05 NOTE — Patient Instructions (Signed)

## 2017-08-12 DIAGNOSIS — R338 Other retention of urine: Secondary | ICD-10-CM | POA: Diagnosis not present

## 2017-09-02 ENCOUNTER — Inpatient Hospital Stay: Payer: Medicare Other

## 2017-09-02 ENCOUNTER — Inpatient Hospital Stay: Payer: Medicare Other | Attending: Oncology

## 2017-09-09 DIAGNOSIS — R338 Other retention of urine: Secondary | ICD-10-CM | POA: Diagnosis not present

## 2017-09-30 ENCOUNTER — Inpatient Hospital Stay: Payer: Medicare Other | Attending: Oncology

## 2017-09-30 ENCOUNTER — Inpatient Hospital Stay: Payer: Medicare Other

## 2017-09-30 VITALS — BP 192/85 | HR 66 | Temp 98.1°F | Resp 18

## 2017-09-30 DIAGNOSIS — D510 Vitamin B12 deficiency anemia due to intrinsic factor deficiency: Secondary | ICD-10-CM

## 2017-09-30 DIAGNOSIS — N179 Acute kidney failure, unspecified: Secondary | ICD-10-CM

## 2017-09-30 DIAGNOSIS — N189 Chronic kidney disease, unspecified: Secondary | ICD-10-CM

## 2017-09-30 DIAGNOSIS — Z79899 Other long term (current) drug therapy: Secondary | ICD-10-CM | POA: Insufficient documentation

## 2017-09-30 DIAGNOSIS — D649 Anemia, unspecified: Secondary | ICD-10-CM

## 2017-09-30 LAB — CBC WITH DIFFERENTIAL/PLATELET
BASOS ABS: 0.1 10*3/uL (ref 0.0–0.1)
BASOS PCT: 2 %
EOS ABS: 0.5 10*3/uL (ref 0.0–0.5)
Eosinophils Relative: 7 %
HEMATOCRIT: 31.9 % — AB (ref 38.4–49.9)
Hemoglobin: 10.8 g/dL — ABNORMAL LOW (ref 13.0–17.1)
Lymphocytes Relative: 39 %
Lymphs Abs: 2.6 10*3/uL (ref 0.9–3.3)
MCH: 29.8 pg (ref 27.2–33.4)
MCHC: 33.9 g/dL (ref 32.0–36.0)
MCV: 88.1 fL (ref 79.3–98.0)
MONOS PCT: 6 %
Monocytes Absolute: 0.4 10*3/uL (ref 0.1–0.9)
NEUTROS ABS: 3.2 10*3/uL (ref 1.5–6.5)
NEUTROS PCT: 46 %
Platelets: 217 10*3/uL (ref 140–400)
RBC: 3.62 MIL/uL — ABNORMAL LOW (ref 4.20–5.82)
RDW: 14.4 % (ref 11.0–14.6)
WBC: 6.8 10*3/uL (ref 4.0–10.3)

## 2017-09-30 MED ORDER — CYANOCOBALAMIN 1000 MCG/ML IJ SOLN
1000.0000 ug | Freq: Once | INTRAMUSCULAR | Status: AC
  Administered 2017-09-30: 1000 ug via INTRAMUSCULAR

## 2017-09-30 MED ORDER — CYANOCOBALAMIN 1000 MCG/ML IJ SOLN
INTRAMUSCULAR | Status: AC
  Filled 2017-09-30: qty 1

## 2017-09-30 MED ORDER — DARBEPOETIN ALFA 300 MCG/0.6ML IJ SOSY
PREFILLED_SYRINGE | INTRAMUSCULAR | Status: AC
  Filled 2017-09-30: qty 0.6

## 2017-10-07 DIAGNOSIS — R338 Other retention of urine: Secondary | ICD-10-CM | POA: Diagnosis not present

## 2017-10-27 ENCOUNTER — Other Ambulatory Visit: Payer: Self-pay | Admitting: Oncology

## 2017-10-27 DIAGNOSIS — D510 Vitamin B12 deficiency anemia due to intrinsic factor deficiency: Secondary | ICD-10-CM

## 2017-10-28 ENCOUNTER — Inpatient Hospital Stay: Payer: Medicare Other

## 2017-10-28 ENCOUNTER — Inpatient Hospital Stay: Payer: Medicare Other | Attending: Oncology

## 2017-10-28 VITALS — BP 227/89 | HR 67 | Temp 97.7°F | Resp 18

## 2017-10-28 DIAGNOSIS — Z79899 Other long term (current) drug therapy: Secondary | ICD-10-CM | POA: Diagnosis not present

## 2017-10-28 DIAGNOSIS — D510 Vitamin B12 deficiency anemia due to intrinsic factor deficiency: Secondary | ICD-10-CM | POA: Insufficient documentation

## 2017-10-28 LAB — CBC WITH DIFFERENTIAL (CANCER CENTER ONLY)
Basophils Absolute: 0.1 10*3/uL (ref 0.0–0.1)
Basophils Relative: 1 %
Eosinophils Absolute: 0.5 10*3/uL (ref 0.0–0.5)
Eosinophils Relative: 7 %
HEMATOCRIT: 30.6 % — AB (ref 38.4–49.9)
Hemoglobin: 10.2 g/dL — ABNORMAL LOW (ref 13.0–17.1)
LYMPHS ABS: 2.3 10*3/uL (ref 0.9–3.3)
Lymphocytes Relative: 34 %
MCH: 29.6 pg (ref 27.2–33.4)
MCHC: 33.3 g/dL (ref 32.0–36.0)
MCV: 88.7 fL (ref 79.3–98.0)
MONOS PCT: 6 %
Monocytes Absolute: 0.4 10*3/uL (ref 0.1–0.9)
NEUTROS ABS: 3.7 10*3/uL (ref 1.5–6.5)
NEUTROS PCT: 52 %
Platelet Count: 210 10*3/uL (ref 140–400)
RBC: 3.45 MIL/uL — ABNORMAL LOW (ref 4.20–5.82)
RDW: 14.4 % (ref 11.0–14.6)
WBC Count: 6.9 10*3/uL (ref 4.0–10.3)

## 2017-10-28 MED ORDER — CYANOCOBALAMIN 1000 MCG/ML IJ SOLN
1000.0000 ug | Freq: Once | INTRAMUSCULAR | Status: AC
  Administered 2017-10-28: 1000 ug via INTRAMUSCULAR

## 2017-10-28 MED ORDER — CYANOCOBALAMIN 1000 MCG/ML IJ SOLN
INTRAMUSCULAR | Status: AC
  Filled 2017-10-28: qty 1

## 2017-11-04 DIAGNOSIS — R338 Other retention of urine: Secondary | ICD-10-CM | POA: Diagnosis not present

## 2017-11-24 ENCOUNTER — Other Ambulatory Visit: Payer: Self-pay

## 2017-11-24 DIAGNOSIS — D510 Vitamin B12 deficiency anemia due to intrinsic factor deficiency: Secondary | ICD-10-CM

## 2017-11-25 ENCOUNTER — Inpatient Hospital Stay: Payer: Medicare Other

## 2017-11-25 ENCOUNTER — Inpatient Hospital Stay: Payer: Medicare Other | Attending: Oncology

## 2017-11-25 DIAGNOSIS — D510 Vitamin B12 deficiency anemia due to intrinsic factor deficiency: Secondary | ICD-10-CM | POA: Insufficient documentation

## 2017-11-25 DIAGNOSIS — Z79899 Other long term (current) drug therapy: Secondary | ICD-10-CM | POA: Insufficient documentation

## 2017-11-25 MED ORDER — DARBEPOETIN ALFA 300 MCG/0.6ML IJ SOSY
PREFILLED_SYRINGE | INTRAMUSCULAR | Status: AC
Start: 2017-11-25 — End: ?
  Filled 2017-11-25: qty 0.6

## 2017-11-27 ENCOUNTER — Telehealth: Payer: Self-pay

## 2017-11-27 NOTE — Telephone Encounter (Signed)
Spoke with patient daughter concerning r/s of missed appointment. Per 9/19 phone msg return call. Patient was r/s and aware of time.

## 2017-11-28 ENCOUNTER — Inpatient Hospital Stay: Payer: Medicare Other

## 2017-11-28 VITALS — BP 200/92 | HR 74 | Temp 97.5°F | Resp 16

## 2017-11-28 DIAGNOSIS — D510 Vitamin B12 deficiency anemia due to intrinsic factor deficiency: Secondary | ICD-10-CM

## 2017-11-28 DIAGNOSIS — Z79899 Other long term (current) drug therapy: Secondary | ICD-10-CM | POA: Diagnosis not present

## 2017-11-28 LAB — CBC WITH DIFFERENTIAL (CANCER CENTER ONLY)
Basophils Absolute: 0.1 10*3/uL (ref 0.0–0.1)
Basophils Relative: 1 %
Eosinophils Absolute: 0.7 10*3/uL — ABNORMAL HIGH (ref 0.0–0.5)
Eosinophils Relative: 9 %
HCT: 33.6 % — ABNORMAL LOW (ref 38.4–49.9)
Hemoglobin: 11.3 g/dL — ABNORMAL LOW (ref 13.0–17.1)
Lymphocytes Relative: 36 %
Lymphs Abs: 2.8 10*3/uL (ref 0.9–3.3)
MCH: 29.8 pg (ref 27.2–33.4)
MCHC: 33.6 g/dL (ref 32.0–36.0)
MCV: 88.7 fL (ref 79.3–98.0)
Monocytes Absolute: 0.4 10*3/uL (ref 0.1–0.9)
Monocytes Relative: 5 %
Neutro Abs: 3.7 10*3/uL (ref 1.5–6.5)
Neutrophils Relative %: 49 %
Platelet Count: 242 10*3/uL (ref 140–400)
RBC: 3.79 MIL/uL — ABNORMAL LOW (ref 4.20–5.82)
RDW: 14.2 % (ref 11.0–14.6)
WBC Count: 7.6 10*3/uL (ref 4.0–10.3)

## 2017-11-28 MED ORDER — CYANOCOBALAMIN 1000 MCG/ML IJ SOLN
1000.0000 ug | Freq: Once | INTRAMUSCULAR | Status: AC
  Administered 2017-11-28: 1000 ug via INTRAMUSCULAR

## 2017-11-28 MED ORDER — CYANOCOBALAMIN 1000 MCG/ML IJ SOLN
INTRAMUSCULAR | Status: AC
  Filled 2017-11-28: qty 1

## 2017-11-28 MED ORDER — DARBEPOETIN ALFA 300 MCG/0.6ML IJ SOSY
PREFILLED_SYRINGE | INTRAMUSCULAR | Status: AC
  Filled 2017-11-28: qty 0.6

## 2017-11-28 NOTE — Patient Instructions (Signed)

## 2017-11-28 NOTE — Progress Notes (Signed)
Hemoglobin noted at 11.3 on labs today. No Aranesp Injection needed at this time. B12 given as ordered.

## 2017-12-02 DIAGNOSIS — R338 Other retention of urine: Secondary | ICD-10-CM | POA: Diagnosis not present

## 2017-12-23 ENCOUNTER — Inpatient Hospital Stay: Payer: Medicare Other | Attending: Oncology

## 2017-12-23 ENCOUNTER — Inpatient Hospital Stay: Payer: Medicare Other

## 2017-12-23 VITALS — BP 238/105 | HR 72 | Temp 97.7°F | Resp 18

## 2017-12-23 DIAGNOSIS — D510 Vitamin B12 deficiency anemia due to intrinsic factor deficiency: Secondary | ICD-10-CM

## 2017-12-23 DIAGNOSIS — Z79899 Other long term (current) drug therapy: Secondary | ICD-10-CM | POA: Diagnosis not present

## 2017-12-23 DIAGNOSIS — E538 Deficiency of other specified B group vitamins: Secondary | ICD-10-CM | POA: Diagnosis not present

## 2017-12-23 LAB — CBC WITH DIFFERENTIAL (CANCER CENTER ONLY)
Abs Immature Granulocytes: 0.01 10*3/uL (ref 0.00–0.07)
BASOS ABS: 0.1 10*3/uL (ref 0.0–0.1)
Basophils Relative: 1 %
EOS ABS: 0.7 10*3/uL — AB (ref 0.0–0.5)
EOS PCT: 10 %
HEMATOCRIT: 30.6 % — AB (ref 39.0–52.0)
HEMOGLOBIN: 10.3 g/dL — AB (ref 13.0–17.0)
IMMATURE GRANULOCYTES: 0 %
LYMPHS ABS: 3.3 10*3/uL (ref 0.7–4.0)
LYMPHS PCT: 47 %
MCH: 29.6 pg (ref 26.0–34.0)
MCHC: 33.7 g/dL (ref 30.0–36.0)
MCV: 87.9 fL (ref 80.0–100.0)
Monocytes Absolute: 0.4 10*3/uL (ref 0.1–1.0)
Monocytes Relative: 6 %
NEUTROS PCT: 36 %
Neutro Abs: 2.5 10*3/uL (ref 1.7–7.7)
Platelet Count: 227 10*3/uL (ref 150–400)
RBC: 3.48 MIL/uL — ABNORMAL LOW (ref 4.22–5.81)
RDW: 13.3 % (ref 11.5–15.5)
WBC Count: 7 10*3/uL (ref 4.0–10.5)
nRBC: 0.3 % — ABNORMAL HIGH (ref 0.0–0.2)

## 2017-12-23 MED ORDER — DARBEPOETIN ALFA 300 MCG/0.6ML IJ SOSY: 300.0000 ug | PREFILLED_SYRINGE | Freq: Once | INTRAMUSCULAR | Status: DC

## 2017-12-23 MED ORDER — DARBEPOETIN ALFA 300 MCG/0.6ML IJ SOSY
PREFILLED_SYRINGE | INTRAMUSCULAR | Status: AC
  Filled 2017-12-23: qty 0.6

## 2017-12-23 MED ORDER — CYANOCOBALAMIN 1000 MCG/ML IJ SOLN
INTRAMUSCULAR | Status: AC
  Filled 2017-12-23: qty 1

## 2017-12-23 MED ORDER — CYANOCOBALAMIN 1000 MCG/ML IJ SOLN
1000.0000 ug | Freq: Once | INTRAMUSCULAR | Status: AC
  Administered 2017-12-23: 1000 ug via INTRAMUSCULAR

## 2017-12-23 NOTE — Progress Notes (Signed)
B-12 injection was given to pt. Aranesp injection on hold due to high bp 238/105. Dr. Jana Hakim aware of findings

## 2017-12-30 DIAGNOSIS — R338 Other retention of urine: Secondary | ICD-10-CM | POA: Diagnosis not present

## 2018-01-20 ENCOUNTER — Inpatient Hospital Stay: Payer: Medicare Other | Attending: Oncology

## 2018-01-20 ENCOUNTER — Inpatient Hospital Stay: Payer: Medicare Other

## 2018-01-20 VITALS — BP 224/82 | HR 75 | Resp 18

## 2018-01-20 DIAGNOSIS — Z79899 Other long term (current) drug therapy: Secondary | ICD-10-CM | POA: Insufficient documentation

## 2018-01-20 DIAGNOSIS — E538 Deficiency of other specified B group vitamins: Secondary | ICD-10-CM | POA: Insufficient documentation

## 2018-01-20 DIAGNOSIS — D510 Vitamin B12 deficiency anemia due to intrinsic factor deficiency: Secondary | ICD-10-CM

## 2018-01-20 LAB — CBC WITH DIFFERENTIAL (CANCER CENTER ONLY)
ABS IMMATURE GRANULOCYTES: 0.03 10*3/uL (ref 0.00–0.07)
BASOS PCT: 1 %
Basophils Absolute: 0.1 10*3/uL (ref 0.0–0.1)
EOS ABS: 0.6 10*3/uL — AB (ref 0.0–0.5)
Eosinophils Relative: 6 %
HCT: 31.8 % — ABNORMAL LOW (ref 39.0–52.0)
Hemoglobin: 10.7 g/dL — ABNORMAL LOW (ref 13.0–17.0)
Immature Granulocytes: 0 %
Lymphocytes Relative: 42 %
Lymphs Abs: 4 10*3/uL (ref 0.7–4.0)
MCH: 29.8 pg (ref 26.0–34.0)
MCHC: 33.6 g/dL (ref 30.0–36.0)
MCV: 88.6 fL (ref 80.0–100.0)
MONO ABS: 0.6 10*3/uL (ref 0.1–1.0)
Monocytes Relative: 7 %
NEUTROS ABS: 4.2 10*3/uL (ref 1.7–7.7)
Neutrophils Relative %: 44 %
PLATELETS: 266 10*3/uL (ref 150–400)
RBC: 3.59 MIL/uL — AB (ref 4.22–5.81)
RDW: 13.3 % (ref 11.5–15.5)
WBC: 9.6 10*3/uL (ref 4.0–10.5)
nRBC: 0 % (ref 0.0–0.2)

## 2018-01-20 MED ORDER — CYANOCOBALAMIN 1000 MCG/ML IJ SOLN
1000.0000 ug | Freq: Once | INTRAMUSCULAR | Status: AC
  Administered 2018-01-20: 1000 ug via INTRAMUSCULAR

## 2018-01-20 MED ORDER — CYANOCOBALAMIN 1000 MCG/ML IJ SOLN
INTRAMUSCULAR | Status: AC
  Filled 2018-01-20: qty 1

## 2018-01-27 DIAGNOSIS — R338 Other retention of urine: Secondary | ICD-10-CM | POA: Diagnosis not present

## 2018-02-17 ENCOUNTER — Inpatient Hospital Stay: Payer: Medicare Other

## 2018-02-17 ENCOUNTER — Inpatient Hospital Stay: Payer: Medicare Other | Attending: Oncology

## 2018-02-17 ENCOUNTER — Telehealth: Payer: Self-pay | Admitting: Oncology

## 2018-02-17 VITALS — BP 192/80 | HR 77 | Temp 98.6°F | Resp 18

## 2018-02-17 DIAGNOSIS — Z79899 Other long term (current) drug therapy: Secondary | ICD-10-CM | POA: Diagnosis not present

## 2018-02-17 DIAGNOSIS — E538 Deficiency of other specified B group vitamins: Secondary | ICD-10-CM | POA: Diagnosis present

## 2018-02-17 DIAGNOSIS — D510 Vitamin B12 deficiency anemia due to intrinsic factor deficiency: Secondary | ICD-10-CM

## 2018-02-17 LAB — CBC WITH DIFFERENTIAL (CANCER CENTER ONLY)
Abs Immature Granulocytes: 0.02 10*3/uL (ref 0.00–0.07)
Basophils Absolute: 0.1 10*3/uL (ref 0.0–0.1)
Basophils Relative: 1 %
Eosinophils Absolute: 0.6 10*3/uL — ABNORMAL HIGH (ref 0.0–0.5)
Eosinophils Relative: 7 %
HCT: 28.8 % — ABNORMAL LOW (ref 39.0–52.0)
Hemoglobin: 9.7 g/dL — ABNORMAL LOW (ref 13.0–17.0)
IMMATURE GRANULOCYTES: 0 %
Lymphocytes Relative: 35 %
Lymphs Abs: 3 10*3/uL (ref 0.7–4.0)
MCH: 30 pg (ref 26.0–34.0)
MCHC: 33.7 g/dL (ref 30.0–36.0)
MCV: 89.2 fL (ref 80.0–100.0)
MONOS PCT: 6 %
Monocytes Absolute: 0.5 10*3/uL (ref 0.1–1.0)
NEUTROS PCT: 51 %
Neutro Abs: 4.3 10*3/uL (ref 1.7–7.7)
Platelet Count: 233 10*3/uL (ref 150–400)
RBC: 3.23 MIL/uL — ABNORMAL LOW (ref 4.22–5.81)
RDW: 13.4 % (ref 11.5–15.5)
WBC Count: 8.6 10*3/uL (ref 4.0–10.5)
nRBC: 0 % (ref 0.0–0.2)

## 2018-02-17 MED ORDER — CYANOCOBALAMIN 1000 MCG/ML IJ SOLN
INTRAMUSCULAR | Status: AC
  Filled 2018-02-17: qty 1

## 2018-02-17 MED ORDER — DARBEPOETIN ALFA 300 MCG/0.6ML IJ SOSY
PREFILLED_SYRINGE | INTRAMUSCULAR | Status: AC
  Filled 2018-02-17: qty 0.6

## 2018-02-17 MED ORDER — CYANOCOBALAMIN 1000 MCG/ML IJ SOLN
1000.0000 ug | Freq: Once | INTRAMUSCULAR | Status: AC
  Administered 2018-02-17: 1000 ug via INTRAMUSCULAR

## 2018-02-17 MED ORDER — DARBEPOETIN ALFA 300 MCG/0.6ML IJ SOSY: 300.0000 ug | PREFILLED_SYRINGE | Freq: Once | INTRAMUSCULAR | Status: DC

## 2018-02-17 NOTE — Telephone Encounter (Signed)
R/s appt per 12/10 sch message for f/u wioth GM - pt daughter is aware of appt date and time

## 2018-02-17 NOTE — Progress Notes (Signed)
Per Dr. Jana Hakim, hold pt aranesp 300 mg injection today due to BP of 192/80.  Dr. Jana Hakim stated he wanted to follow up with pt March 17, 2018.  Message sent to scheduling.

## 2018-02-25 DIAGNOSIS — R338 Other retention of urine: Secondary | ICD-10-CM | POA: Diagnosis not present

## 2018-03-17 ENCOUNTER — Ambulatory Visit: Payer: Medicare Other

## 2018-03-17 ENCOUNTER — Other Ambulatory Visit: Payer: Medicare Other

## 2018-03-17 NOTE — Progress Notes (Signed)
°[facility]  °[phone] [phone]  ° ° ° °ID: Caleb Hayes OB: 06/08/1929  MR#: [id number]  CSN#:[id number] ° °PCP: No primary care provider on file. °GYN:   °SU:  °OTHER MD: ° °CHIEF COMPLAINT: Pernicious anemia ° °CURRENT TREATMENT: B-12 supplementation, [aranesp for anemia of renal failure] ° ° °HX PRESENT ILLNESS: °From the earlier summary note: ° °Caleb Hayes presented to the emergency room on 05/16/2013 with complaints of weakness, fatigue, and lower extremity pain. Exam was unrevealing but lab work showed a creatinine of 4.34 with BUN 89. Potassium, sodium, calcium, and total protein were normal. The white cell count was 3.7, platelet count 75,000 and hemoglobin 7.3 with an MCV of 98. Ferritin was 3619, folate 14.4, and B12 160. Bilateral venous Dopplers were consistent with acute deep venous thrombosis involving the right common femoral vein, right popliteal vein and the) Caleb Hayes. On the left side there was a posterior tibial vein thrombus of indeterminate age.  ° °We were consulted to further evaluate the cytopenias and rule out multiple myeloma. In addition there was a question of anticoagulation in this patient with thrombocytopenia. The patient's subsequet history is as detailed below ° ° °INTERVAL HISTORY: °Caleb Hayes returns today for follow-up of his pernicious anemia and anemia of renal failure. He is unaccompanied today. ° °Interval history since his last visit was not significant for any changes.  He tolerates the B12 shots with no problems. ° ° °REVIEW OF SYSTEMS: °Caleb Hayes reports he is doing okay today. He reports he doesn't take anything for his blood pressure. He states his previous medicine made him dizzy. He doesn't currently have a PCP.  He sees a urologist because he has a Foley in place, which is intermittently changed, but he does not recall his urologist's name. The patient denies unusual headaches, visual changes, nausea, vomiting, stiff neck,  dizziness, or gait imbalance. There has been no cough, phlegm production, or pleurisy, no chest pain or pressure, and no change in bowel habits. The patient denies fever, rash, bleeding, unexplained fatigue or unexplained weight loss. A detailed review of systems was otherwise entirely negative. ° ° °PAST MEDICAL HISTORY: °Past Medical History:  °Diagnosis Date  °• Gout   °• Hypertension   ° ° °PAST SURGICAL HISTORY: °No past surgical history on file. ° °FAMILY HISTORY °Family History  °Problem Relation Age of Onset  °• Cervical cancer Mother   °• Diabetes Father   °• Vision loss Paternal Grandfather   ° ° °SOCIAL HISTORY:  °The patient currently lives with his sister. He has a daughter named Caleb Hayes. She has a 3d shift job.  °  ° ADVANCED DIRECTIVES: At his visit 07/04/2015 the patient was given advanced directive forms to complete and notarize. These were in fact completed but they are not yet scanned ° ° °HEALTH MAINTENANCE: °Social History  ° °Tobacco Use  °• Smoking status: Former Smoker  °• Smokeless tobacco: Never Used  °Substance Use Topics  °• Alcohol use: No  °• Drug use: No  ° ° °No Known Allergies ° °Current Outpatient Medications  °Medication Sig Dispense Refill  °• amLODipine (NORVASC) 10 MG tablet Take 1 tablet (10 mg total) by mouth daily. (Patient taking differently: Take 10 mg by mouth daily. ) 30 tablet 1  °• ciprofloxacin (CIPRO) 250 MG tablet Take 1 tablet (250 mg total) by mouth 2 (two) times daily. 6 tablet 0  °• feeding supplement (ENSURE IMMUNE HEALTH) LIQD Take 237 mLs by mouth daily as   as needed (NUTRITIONAL).     No current facility-administered medications for this visit.     OBJECTIVE: elderly African American man who appears well Vitals:   03/18/18 0937  BP: (!) 208/90  Pulse: 72  Resp: 18  Temp: 98 F (36.7 C)  SpO2: 99%     Body mass index is 25.84 kg/m.    ECOG FS:1 - Symptomatic but completely ambulatory  Sclerae unicteric, EOMs intact No cervical or supraclavicular  adenopathy Lungs no rales or rhonchi Heart regular rate and rhythm Abd soft, nontender, positive bowel sounds MSK no focal spinal tenderness, no upper extremity lymphedema Neuro: nonfocal, well oriented, appropriate affect      LAB RESULTS:  CMP     Component Value Date/Time   NA 139 10/23/2016 0915   K 5.9 No visable hemolysis (H) 10/23/2016 0915   CL 116 (H) 10/01/2016 1356   CO2 16 (L) 10/23/2016 0915   GLUCOSE 92 10/23/2016 0915   BUN 52.4 (H) 10/23/2016 0915   CREATININE 6.5 (HH) 10/23/2016 0915   CALCIUM 9.4 10/23/2016 0915   PROT 7.7 10/23/2016 0915   ALBUMIN 3.3 (L) 10/23/2016 0915   AST 18 10/23/2016 0915   ALT 12 10/23/2016 0915   ALKPHOS 127 10/23/2016 0915   BILITOT 0.41 10/23/2016 0915   GFRNONAA 10 (L) 11/15/2015 0545   GFRAA 11 (L) 11/15/2015 0545    I No results found for: SPEP  Lab Results  Component Value Date   WBC 6.8 03/18/2018   NEUTROABS 2.8 03/18/2018   HGB 9.9 (L) 03/18/2018   HCT 30.2 (L) 03/18/2018   MCV 89.1 03/18/2018   PLT 246 03/18/2018    _0 @  No results found for: LABCA2  No components found for: [id number]  No results for input(s): INR in the last 168 hours.  Urinalysis    Component Value Date/Time   COLORURINE RED (A) 11/11/2015 1310   APPEARANCEUR TURBID (A) 11/11/2015 1310   LABSPEC 1.028 11/11/2015 1310   PHURINE 7.5 11/11/2015 1310   GLUCOSEU 100 (A) 11/11/2015 1310   HGBUR LARGE (A) 11/11/2015 1310   BILIRUBINUR NEGATIVE 11/11/2015 1310   KETONESUR NEGATIVE 11/11/2015 1310   PROTEINUR >300 (A) 11/11/2015 1310   UROBILINOGEN 0.2 08/05/2014 1446   NITRITE NEGATIVE 11/11/2015 1310   LEUKOCYTESUR MODERATE (A) 11/11/2015 1310    STUDIES: EKGs Today showed bradycardia but were otherwise unremarkable  ASSESSMENT: 85 y.o. year old [place] man   (1) pernicious anemia with intrinsic factor antibodies documented March 2015  (2) renal insufficiency with negative myeloma workup   (3) Right lower  extremity DVT March 2015, treated with coumadin for 6 months  (4) poorly controlled hypertension  (5) hyperkalemia  PLAN: Caleb Hayes is doing fine as far as his pernicious anemia is concerned and we are continuing his B12 supplementation on a monthly basis indefinitely.  We had started him on Aranesp but have not been able to give him any doses because of his uncontrolled blood pressure.  He was supposedly on amlodipine but he tells me if he took that it made him dizzy and so he stopped.  Note that he does not have a primary care physician at this point.  We discussed the fact that blood pressure, when not well controlled, can not only damage to the kidneys and heart but can also cause strokes which would be terrible in terms of his quality of life.  This is more important to him of course than concerns about dying.  I am writing  him for the lowest available dose of lisinopril and I hope that he will be able to take it without side effect.  I also encouraged him to establish himself with a primary care physician closer to home.  I asked him if he would be willing to go through dialysis if it came to that and he says that he would not.  Apparently his ex-wife as well as other relatives have been on dialysis and have not done well with it and he is aware that the quality of life under those circumstances is borderline.  He tells me he did see a kidney doctor a few years ago and dialysis was discussed but he has no interest in pursuing those possibilities.  He will see me again in a year.  He knows to call for any other issues that may develop before the next visit.   I, Wilburn Mylar, am acting as scribe for Dr. Sarajane Jews C. Magrinat.  I, Lurline Del MD, have reviewed the above documentation for accuracy and completeness, and I agree with the above.

## 2018-03-18 ENCOUNTER — Inpatient Hospital Stay: Payer: Medicare Other | Attending: Oncology | Admitting: Oncology

## 2018-03-18 ENCOUNTER — Telehealth: Payer: Self-pay | Admitting: Oncology

## 2018-03-18 ENCOUNTER — Inpatient Hospital Stay: Payer: Medicare Other

## 2018-03-18 VITALS — BP 208/90 | HR 72 | Temp 98.0°F | Resp 18 | Wt 160.1 lb

## 2018-03-18 DIAGNOSIS — M109 Gout, unspecified: Secondary | ICD-10-CM | POA: Diagnosis not present

## 2018-03-18 DIAGNOSIS — N185 Chronic kidney disease, stage 5: Secondary | ICD-10-CM

## 2018-03-18 DIAGNOSIS — E875 Hyperkalemia: Secondary | ICD-10-CM | POA: Insufficient documentation

## 2018-03-18 DIAGNOSIS — D631 Anemia in chronic kidney disease: Secondary | ICD-10-CM | POA: Diagnosis not present

## 2018-03-18 DIAGNOSIS — I129 Hypertensive chronic kidney disease with stage 1 through stage 4 chronic kidney disease, or unspecified chronic kidney disease: Secondary | ICD-10-CM

## 2018-03-18 DIAGNOSIS — Z87891 Personal history of nicotine dependence: Secondary | ICD-10-CM | POA: Insufficient documentation

## 2018-03-18 DIAGNOSIS — Z79899 Other long term (current) drug therapy: Secondary | ICD-10-CM | POA: Insufficient documentation

## 2018-03-18 DIAGNOSIS — D510 Vitamin B12 deficiency anemia due to intrinsic factor deficiency: Secondary | ICD-10-CM | POA: Diagnosis not present

## 2018-03-18 DIAGNOSIS — D649 Anemia, unspecified: Secondary | ICD-10-CM

## 2018-03-18 DIAGNOSIS — R531 Weakness: Secondary | ICD-10-CM

## 2018-03-18 DIAGNOSIS — N189 Chronic kidney disease, unspecified: Secondary | ICD-10-CM

## 2018-03-18 DIAGNOSIS — I120 Hypertensive chronic kidney disease with stage 5 chronic kidney disease or end stage renal disease: Secondary | ICD-10-CM

## 2018-03-18 DIAGNOSIS — R5383 Other fatigue: Secondary | ICD-10-CM | POA: Diagnosis not present

## 2018-03-18 DIAGNOSIS — Z86718 Personal history of other venous thrombosis and embolism: Secondary | ICD-10-CM | POA: Diagnosis not present

## 2018-03-18 DIAGNOSIS — N179 Acute kidney failure, unspecified: Secondary | ICD-10-CM

## 2018-03-18 DIAGNOSIS — N170 Acute kidney failure with tubular necrosis: Secondary | ICD-10-CM

## 2018-03-18 DIAGNOSIS — N184 Chronic kidney disease, stage 4 (severe): Secondary | ICD-10-CM

## 2018-03-18 LAB — CBC WITH DIFFERENTIAL (CANCER CENTER ONLY)
Abs Immature Granulocytes: 0.03 10*3/uL (ref 0.00–0.07)
Basophils Absolute: 0.1 10*3/uL (ref 0.0–0.1)
Basophils Relative: 1 %
EOS ABS: 0.6 10*3/uL — AB (ref 0.0–0.5)
Eosinophils Relative: 9 %
HCT: 30.2 % — ABNORMAL LOW (ref 39.0–52.0)
Hemoglobin: 9.9 g/dL — ABNORMAL LOW (ref 13.0–17.0)
Immature Granulocytes: 0 %
Lymphocytes Relative: 42 %
Lymphs Abs: 2.9 10*3/uL (ref 0.7–4.0)
MCH: 29.2 pg (ref 26.0–34.0)
MCHC: 32.8 g/dL (ref 30.0–36.0)
MCV: 89.1 fL (ref 80.0–100.0)
Monocytes Absolute: 0.5 10*3/uL (ref 0.1–1.0)
Monocytes Relative: 7 %
Neutro Abs: 2.8 10*3/uL (ref 1.7–7.7)
Neutrophils Relative %: 41 %
Platelet Count: 246 10*3/uL (ref 150–400)
RBC: 3.39 MIL/uL — ABNORMAL LOW (ref 4.22–5.81)
RDW: 13.2 % (ref 11.5–15.5)
WBC Count: 6.8 10*3/uL (ref 4.0–10.5)
nRBC: 0 % (ref 0.0–0.2)

## 2018-03-18 MED ORDER — CYANOCOBALAMIN 1000 MCG/ML IJ SOLN
1000.0000 ug | Freq: Once | INTRAMUSCULAR | Status: AC
  Administered 2018-03-18: 1000 ug via INTRAMUSCULAR

## 2018-03-18 MED ORDER — CYANOCOBALAMIN 1000 MCG/ML IJ SOLN
INTRAMUSCULAR | Status: AC
  Filled 2018-03-18: qty 1

## 2018-03-18 MED ORDER — LISINOPRIL 2.5 MG PO TABS: 2.5000 mg | ORAL_TABLET | Freq: Every day | ORAL | 4 refills | Status: DC

## 2018-03-18 NOTE — Telephone Encounter (Signed)
Gave avs and calendar ° °

## 2018-03-25 DIAGNOSIS — N400 Enlarged prostate without lower urinary tract symptoms: Secondary | ICD-10-CM | POA: Diagnosis not present

## 2018-03-25 DIAGNOSIS — R338 Other retention of urine: Secondary | ICD-10-CM | POA: Diagnosis not present

## 2018-04-14 ENCOUNTER — Inpatient Hospital Stay: Payer: Medicare Other | Attending: Oncology

## 2018-04-14 ENCOUNTER — Inpatient Hospital Stay: Payer: Medicare Other

## 2018-04-14 VITALS — BP 231/92 | HR 62 | Temp 98.1°F | Resp 18

## 2018-04-14 DIAGNOSIS — D510 Vitamin B12 deficiency anemia due to intrinsic factor deficiency: Secondary | ICD-10-CM | POA: Insufficient documentation

## 2018-04-14 LAB — CBC WITH DIFFERENTIAL (CANCER CENTER ONLY)
Abs Immature Granulocytes: 0.06 10*3/uL (ref 0.00–0.07)
Basophils Absolute: 0.1 10*3/uL (ref 0.0–0.1)
Basophils Relative: 2 %
Eosinophils Absolute: 0.6 10*3/uL — ABNORMAL HIGH (ref 0.0–0.5)
Eosinophils Relative: 8 %
HCT: 29.3 % — ABNORMAL LOW (ref 39.0–52.0)
Hemoglobin: 10.1 g/dL — ABNORMAL LOW (ref 13.0–17.0)
Immature Granulocytes: 1 %
Lymphocytes Relative: 38 %
Lymphs Abs: 2.8 10*3/uL (ref 0.7–4.0)
MCH: 30 pg (ref 26.0–34.0)
MCHC: 34.5 g/dL (ref 30.0–36.0)
MCV: 86.9 fL (ref 80.0–100.0)
Monocytes Absolute: 0.5 10*3/uL (ref 0.1–1.0)
Monocytes Relative: 6 %
NEUTROS ABS: 3.5 10*3/uL (ref 1.7–7.7)
Neutrophils Relative %: 45 %
Platelet Count: 261 10*3/uL (ref 150–400)
RBC: 3.37 MIL/uL — ABNORMAL LOW (ref 4.22–5.81)
RDW: 13.2 % (ref 11.5–15.5)
WBC Count: 7.5 10*3/uL (ref 4.0–10.5)
nRBC: 0 % (ref 0.0–0.2)

## 2018-04-14 MED ORDER — CYANOCOBALAMIN 1000 MCG/ML IJ SOLN
1000.0000 ug | Freq: Once | INTRAMUSCULAR | Status: AC
  Administered 2018-04-14: 1000 ug via INTRAMUSCULAR

## 2018-04-21 DIAGNOSIS — R338 Other retention of urine: Secondary | ICD-10-CM | POA: Diagnosis not present

## 2018-05-12 ENCOUNTER — Inpatient Hospital Stay: Payer: Medicare Other | Attending: Oncology

## 2018-05-12 ENCOUNTER — Inpatient Hospital Stay: Payer: Medicare Other

## 2018-05-12 VITALS — BP 220/86 | HR 85 | Temp 97.8°F | Resp 18

## 2018-05-12 DIAGNOSIS — D510 Vitamin B12 deficiency anemia due to intrinsic factor deficiency: Secondary | ICD-10-CM | POA: Insufficient documentation

## 2018-05-12 DIAGNOSIS — Z79899 Other long term (current) drug therapy: Secondary | ICD-10-CM | POA: Insufficient documentation

## 2018-05-12 LAB — CBC WITH DIFFERENTIAL (CANCER CENTER ONLY)
Abs Immature Granulocytes: 0.03 10*3/uL (ref 0.00–0.07)
BASOS PCT: 1 %
Basophils Absolute: 0.1 10*3/uL (ref 0.0–0.1)
Eosinophils Absolute: 0.5 10*3/uL (ref 0.0–0.5)
Eosinophils Relative: 7 %
HCT: 29.5 % — ABNORMAL LOW (ref 39.0–52.0)
Hemoglobin: 10 g/dL — ABNORMAL LOW (ref 13.0–17.0)
Immature Granulocytes: 0 %
Lymphocytes Relative: 36 %
Lymphs Abs: 2.5 10*3/uL (ref 0.7–4.0)
MCH: 29.4 pg (ref 26.0–34.0)
MCHC: 33.9 g/dL (ref 30.0–36.0)
MCV: 86.8 fL (ref 80.0–100.0)
Monocytes Absolute: 0.5 10*3/uL (ref 0.1–1.0)
Monocytes Relative: 7 %
Neutro Abs: 3.5 10*3/uL (ref 1.7–7.7)
Neutrophils Relative %: 49 %
Platelet Count: 230 10*3/uL (ref 150–400)
RBC: 3.4 MIL/uL — ABNORMAL LOW (ref 4.22–5.81)
RDW: 13.2 % (ref 11.5–15.5)
WBC Count: 7 10*3/uL (ref 4.0–10.5)
nRBC: 0 % (ref 0.0–0.2)

## 2018-05-12 MED ORDER — CYANOCOBALAMIN 1000 MCG/ML IJ SOLN
1000.0000 ug | Freq: Once | INTRAMUSCULAR | Status: AC
  Administered 2018-05-12: 1000 ug via INTRAMUSCULAR

## 2018-05-12 NOTE — Patient Instructions (Signed)
Cyanocobalamin, Vitamin B12 injection What is this medicine? CYANOCOBALAMIN (sye an oh koe BAL a min) is a man made form of vitamin B12. Vitamin B12 is used in the growth of healthy blood cells, nerve cells, and proteins in the body. It also helps with the metabolism of fats and carbohydrates. This medicine is used to treat people who can not absorb vitamin B12. This medicine may be used for other purposes; ask your health care provider or pharmacist if you have questions. COMMON BRAND NAME(S): B-12 Compliance Kit, B-12 Injection Kit, Cyomin, LA-12, Nutri-Twelve, Physicians EZ Use B-12, Primabalt What should I tell my health care provider before I take this medicine? They need to know if you have any of these conditions: -kidney disease -Leber's disease -megaloblastic anemia -an unusual or allergic reaction to cyanocobalamin, cobalt, other medicines, foods, dyes, or preservatives -pregnant or trying to get pregnant -breast-feeding How should I use this medicine? This medicine is injected into a muscle or deeply under the skin. It is usually given by a health care professional in a clinic or doctor's office. However, your doctor may teach you how to inject yourself. Follow all instructions. Talk to your pediatrician regarding the use of this medicine in children. Special care may be needed. Overdosage: If you think you have taken too much of this medicine contact a poison control center or emergency room at once. NOTE: This medicine is only for you. Do not share this medicine with others. What if I miss a dose? If you are given your dose at a clinic or doctor's office, call to reschedule your appointment. If you give your own injections and you miss a dose, take it as soon as you can. If it is almost time for your next dose, take only that dose. Do not take double or extra doses. What may interact with this medicine? -colchicine -heavy alcohol intake This list may not describe all possible  interactions. Give your health care provider a list of all the medicines, herbs, non-prescription drugs, or dietary supplements you use. Also tell them if you smoke, drink alcohol, or use illegal drugs. Some items may interact with your medicine. What should I watch for while using this medicine? Visit your doctor or health care professional regularly. You may need blood work done while you are taking this medicine. You may need to follow a special diet. Talk to your doctor. Limit your alcohol intake and avoid smoking to get the best benefit. What side effects may I notice from receiving this medicine? Side effects that you should report to your doctor or health care professional as soon as possible: -allergic reactions like skin rash, itching or hives, swelling of the face, lips, or tongue -blue tint to skin -chest tightness, pain -difficulty breathing, wheezing -dizziness -red, swollen painful area on the leg Side effects that usually do not require medical attention (report to your doctor or health care professional if they continue or are bothersome): -diarrhea -headache This list may not describe all possible side effects. Call your doctor for medical advice about side effects. You may report side effects to FDA at 1-800-FDA-1088. Where should I keep my medicine? Keep out of the reach of children. Store at room temperature between 15 and 30 degrees C (59 and 85 degrees F). Protect from light. Throw away any unused medicine after the expiration date. NOTE: This sheet is a summary. It may not cover all possible information. If you have questions about this medicine, talk to your doctor, pharmacist, or   health care provider.  2019 Elsevier/Gold Standard (2007-06-08 22:10:20)  

## 2018-05-19 DIAGNOSIS — R338 Other retention of urine: Secondary | ICD-10-CM | POA: Diagnosis not present

## 2018-06-09 ENCOUNTER — Other Ambulatory Visit: Payer: Self-pay

## 2018-06-09 ENCOUNTER — Inpatient Hospital Stay: Payer: Medicare Other

## 2018-06-09 VITALS — BP 217/85 | HR 62 | Temp 98.1°F | Resp 18

## 2018-06-09 DIAGNOSIS — D510 Vitamin B12 deficiency anemia due to intrinsic factor deficiency: Secondary | ICD-10-CM

## 2018-06-09 LAB — CBC WITH DIFFERENTIAL (CANCER CENTER ONLY)
Abs Immature Granulocytes: 0.03 10*3/uL (ref 0.00–0.07)
Basophils Absolute: 0.1 10*3/uL (ref 0.0–0.1)
Basophils Relative: 1 %
EOS PCT: 8 %
Eosinophils Absolute: 0.5 10*3/uL (ref 0.0–0.5)
HCT: 26.9 % — ABNORMAL LOW (ref 39.0–52.0)
Hemoglobin: 9 g/dL — ABNORMAL LOW (ref 13.0–17.0)
Immature Granulocytes: 1 %
Lymphocytes Relative: 42 %
Lymphs Abs: 2.4 10*3/uL (ref 0.7–4.0)
MCH: 29.2 pg (ref 26.0–34.0)
MCHC: 33.5 g/dL (ref 30.0–36.0)
MCV: 87.3 fL (ref 80.0–100.0)
Monocytes Absolute: 0.5 10*3/uL (ref 0.1–1.0)
Monocytes Relative: 9 %
NRBC: 0 % (ref 0.0–0.2)
Neutro Abs: 2.2 10*3/uL (ref 1.7–7.7)
Neutrophils Relative %: 39 %
Platelet Count: 237 10*3/uL (ref 150–400)
RBC: 3.08 MIL/uL — ABNORMAL LOW (ref 4.22–5.81)
RDW: 13.2 % (ref 11.5–15.5)
WBC Count: 5.7 10*3/uL (ref 4.0–10.5)

## 2018-06-09 MED ORDER — CYANOCOBALAMIN 1000 MCG/ML IJ SOLN
INTRAMUSCULAR | Status: AC
  Filled 2018-06-09: qty 1

## 2018-06-09 MED ORDER — CYANOCOBALAMIN 1000 MCG/ML IJ SOLN
1000.0000 ug | Freq: Once | INTRAMUSCULAR | Status: AC
  Administered 2018-06-09: 1000 ug via INTRAMUSCULAR

## 2018-06-09 NOTE — Patient Instructions (Signed)
Cyanocobalamin, Vitamin B12 injection What is this medicine? CYANOCOBALAMIN (sye an oh koe BAL a min) is a man made form of vitamin B12. Vitamin B12 is used in the growth of healthy blood cells, nerve cells, and proteins in the body. It also helps with the metabolism of fats and carbohydrates. This medicine is used to treat people who can not absorb vitamin B12. This medicine may be used for other purposes; ask your health care provider or pharmacist if you have questions. COMMON BRAND NAME(S): B-12 Compliance Kit, B-12 Injection Kit, Cyomin, LA-12, Nutri-Twelve, Physicians EZ Use B-12, Primabalt What should I tell my health care provider before I take this medicine? They need to know if you have any of these conditions: -kidney disease -Leber's disease -megaloblastic anemia -an unusual or allergic reaction to cyanocobalamin, cobalt, other medicines, foods, dyes, or preservatives -pregnant or trying to get pregnant -breast-feeding How should I use this medicine? This medicine is injected into a muscle or deeply under the skin. It is usually given by a health care professional in a clinic or doctor's office. However, your doctor may teach you how to inject yourself. Follow all instructions. Talk to your pediatrician regarding the use of this medicine in children. Special care may be needed. Overdosage: If you think you have taken too much of this medicine contact a poison control center or emergency room at once. NOTE: This medicine is only for you. Do not share this medicine with others. What if I miss a dose? If you are given your dose at a clinic or doctor's office, call to reschedule your appointment. If you give your own injections and you miss a dose, take it as soon as you can. If it is almost time for your next dose, take only that dose. Do not take double or extra doses. What may interact with this medicine? -colchicine -heavy alcohol intake This list may not describe all possible  interactions. Give your health care provider a list of all the medicines, herbs, non-prescription drugs, or dietary supplements you use. Also tell them if you smoke, drink alcohol, or use illegal drugs. Some items may interact with your medicine. What should I watch for while using this medicine? Visit your doctor or health care professional regularly. You may need blood work done while you are taking this medicine. You may need to follow a special diet. Talk to your doctor. Limit your alcohol intake and avoid smoking to get the best benefit. What side effects may I notice from receiving this medicine? Side effects that you should report to your doctor or health care professional as soon as possible: -allergic reactions like skin rash, itching or hives, swelling of the face, lips, or tongue -blue tint to skin -chest tightness, pain -difficulty breathing, wheezing -dizziness -red, swollen painful area on the leg Side effects that usually do not require medical attention (report to your doctor or health care professional if they continue or are bothersome): -diarrhea -headache This list may not describe all possible side effects. Call your doctor for medical advice about side effects. You may report side effects to FDA at 1-800-FDA-1088. Where should I keep my medicine? Keep out of the reach of children. Store at room temperature between 15 and 30 degrees C (59 and 85 degrees F). Protect from light. Throw away any unused medicine after the expiration date. NOTE: This sheet is a summary. It may not cover all possible information. If you have questions about this medicine, talk to your doctor, pharmacist, or   health care provider.  2019 Elsevier/Gold Standard (2007-06-08 22:10:20)  

## 2018-06-16 DIAGNOSIS — R338 Other retention of urine: Secondary | ICD-10-CM | POA: Diagnosis not present

## 2018-07-07 ENCOUNTER — Telehealth: Payer: Self-pay | Admitting: *Deleted

## 2018-07-07 ENCOUNTER — Other Ambulatory Visit: Payer: Self-pay

## 2018-07-07 ENCOUNTER — Ambulatory Visit: Payer: Medicare Other | Admitting: Oncology

## 2018-07-07 ENCOUNTER — Inpatient Hospital Stay: Payer: Medicare Other

## 2018-07-07 ENCOUNTER — Ambulatory Visit: Payer: Medicare Other

## 2018-07-07 ENCOUNTER — Inpatient Hospital Stay: Payer: Medicare Other | Attending: Oncology

## 2018-07-07 VITALS — BP 247/112 | HR 73 | Temp 97.6°F | Resp 18

## 2018-07-07 DIAGNOSIS — Z79899 Other long term (current) drug therapy: Secondary | ICD-10-CM | POA: Insufficient documentation

## 2018-07-07 DIAGNOSIS — D510 Vitamin B12 deficiency anemia due to intrinsic factor deficiency: Secondary | ICD-10-CM

## 2018-07-07 LAB — CBC WITH DIFFERENTIAL (CANCER CENTER ONLY)
Abs Immature Granulocytes: 0.06 10*3/uL (ref 0.00–0.07)
Basophils Absolute: 0 10*3/uL (ref 0.0–0.1)
Basophils Relative: 1 %
Eosinophils Absolute: 0.2 10*3/uL (ref 0.0–0.5)
Eosinophils Relative: 3 %
HCT: 27.5 % — ABNORMAL LOW (ref 39.0–52.0)
Hemoglobin: 9.4 g/dL — ABNORMAL LOW (ref 13.0–17.0)
Immature Granulocytes: 1 %
Lymphocytes Relative: 29 %
Lymphs Abs: 2.1 10*3/uL (ref 0.7–4.0)
MCH: 29.1 pg (ref 26.0–34.0)
MCHC: 34.2 g/dL (ref 30.0–36.0)
MCV: 85.1 fL (ref 80.0–100.0)
Monocytes Absolute: 0.5 10*3/uL (ref 0.1–1.0)
Monocytes Relative: 6 %
Neutro Abs: 4.5 10*3/uL (ref 1.7–7.7)
Neutrophils Relative %: 60 %
Platelet Count: 253 10*3/uL (ref 150–400)
RBC: 3.23 MIL/uL — ABNORMAL LOW (ref 4.22–5.81)
RDW: 13.3 % (ref 11.5–15.5)
WBC Count: 7.4 10*3/uL (ref 4.0–10.5)
nRBC: 0 % (ref 0.0–0.2)

## 2018-07-07 MED ORDER — CYANOCOBALAMIN 1000 MCG/ML IJ SOLN
INTRAMUSCULAR | Status: AC
  Filled 2018-07-07: qty 1

## 2018-07-07 MED ORDER — DARBEPOETIN ALFA 300 MCG/0.6ML IJ SOSY: 300.0000 ug | PREFILLED_SYRINGE | Freq: Once | INTRAMUSCULAR | Status: DC

## 2018-07-07 MED ORDER — CYANOCOBALAMIN 1000 MCG/ML IJ SOLN
1000.0000 ug | Freq: Once | INTRAMUSCULAR | Status: AC
  Administered 2018-07-07: 1000 ug via INTRAMUSCULAR

## 2018-07-07 NOTE — Progress Notes (Signed)
aranesp not given BP 247/112

## 2018-07-07 NOTE — Patient Instructions (Signed)
Cyanocobalamin, Vitamin B12 injection What is this medicine? CYANOCOBALAMIN (sye an oh koe BAL a min) is a man made form of vitamin B12. Vitamin B12 is used in the growth of healthy blood cells, nerve cells, and proteins in the body. It also helps with the metabolism of fats and carbohydrates. This medicine is used to treat people who can not absorb vitamin B12. This medicine may be used for other purposes; ask your health care provider or pharmacist if you have questions. COMMON BRAND NAME(S): B-12 Compliance Kit, B-12 Injection Kit, Cyomin, LA-12, Nutri-Twelve, Physicians EZ Use B-12, Primabalt What should I tell my health care provider before I take this medicine? They need to know if you have any of these conditions: -kidney disease -Leber's disease -megaloblastic anemia -an unusual or allergic reaction to cyanocobalamin, cobalt, other medicines, foods, dyes, or preservatives -pregnant or trying to get pregnant -breast-feeding How should I use this medicine? This medicine is injected into a muscle or deeply under the skin. It is usually given by a health care professional in a clinic or doctor's office. However, your doctor may teach you how to inject yourself. Follow all instructions. Talk to your pediatrician regarding the use of this medicine in children. Special care may be needed. Overdosage: If you think you have taken too much of this medicine contact a poison control center or emergency room at once. NOTE: This medicine is only for you. Do not share this medicine with others. What if I miss a dose? If you are given your dose at a clinic or doctor's office, call to reschedule your appointment. If you give your own injections and you miss a dose, take it as soon as you can. If it is almost time for your next dose, take only that dose. Do not take double or extra doses. What may interact with this medicine? -colchicine -heavy alcohol intake This list may not describe all possible  interactions. Give your health care provider a list of all the medicines, herbs, non-prescription drugs, or dietary supplements you use. Also tell them if you smoke, drink alcohol, or use illegal drugs. Some items may interact with your medicine. What should I watch for while using this medicine? Visit your doctor or health care professional regularly. You may need blood work done while you are taking this medicine. You may need to follow a special diet. Talk to your doctor. Limit your alcohol intake and avoid smoking to get the best benefit. What side effects may I notice from receiving this medicine? Side effects that you should report to your doctor or health care professional as soon as possible: -allergic reactions like skin rash, itching or hives, swelling of the face, lips, or tongue -blue tint to skin -chest tightness, pain -difficulty breathing, wheezing -dizziness -red, swollen painful area on the leg Side effects that usually do not require medical attention (report to your doctor or health care professional if they continue or are bothersome): -diarrhea -headache This list may not describe all possible side effects. Call your doctor for medical advice about side effects. You may report side effects to FDA at 1-800-FDA-1088. Where should I keep my medicine? Keep out of the reach of children. Store at room temperature between 15 and 30 degrees C (59 and 85 degrees F). Protect from light. Throw away any unused medicine after the expiration date. NOTE: This sheet is a summary. It may not cover all possible information. If you have questions about this medicine, talk to your doctor, pharmacist, or   health care provider.  2019 Elsevier/Gold Standard (2007-06-08 22:10:20)  

## 2018-07-07 NOTE — Telephone Encounter (Signed)
This RN was notified by nurse per aranesp and B12 injections ordered for today of elevated BP of 247/112.  Patient is prescribed lisinopril by Dr Jana Hakim.  Per MD - verify pt is taking bp medication- and hold aranesp for today may proceed with B12.  This RN contacted pt's daughter Doroteo Bradford- and discussed above with noted concern that pt may not be taking his lisinopril on a regular basis.  Doroteo Bradford stated in addition to above "I'll tell you what he is doing- he eats sardines with added salt !"  " I tell him not too but he does and then says he puts vinegar on them too but with the salt and I tell him that doesn't help"  This RN discussed concern due to elevated BP and need for daily use of BP medication and need to contact his primary provider ( Dr Elna Breslow ) for further follow up and concern.  Bonita verbalized understanding.

## 2018-07-14 DIAGNOSIS — R338 Other retention of urine: Secondary | ICD-10-CM | POA: Diagnosis not present

## 2018-08-04 ENCOUNTER — Inpatient Hospital Stay: Payer: Medicare Other | Attending: Oncology

## 2018-08-11 DIAGNOSIS — R338 Other retention of urine: Secondary | ICD-10-CM | POA: Diagnosis not present

## 2018-08-13 ENCOUNTER — Emergency Department (HOSPITAL_COMMUNITY): Payer: Medicare Other

## 2018-08-13 ENCOUNTER — Encounter (HOSPITAL_COMMUNITY)
Admission: EM | Disposition: A | Discharge status: Hospice | Payer: Self-pay | Source: Home / Self Care | Attending: Internal Medicine

## 2018-08-13 ENCOUNTER — Inpatient Hospital Stay (HOSPITAL_COMMUNITY)
Admission: EM | Admit: 2018-08-13 | Discharge: 2018-08-15 | DRG: 280 | Disposition: A | Discharge status: Hospice | Payer: Medicare Other | Attending: Internal Medicine | Admitting: Internal Medicine

## 2018-08-13 ENCOUNTER — Other Ambulatory Visit: Payer: Self-pay

## 2018-08-13 ENCOUNTER — Encounter (HOSPITAL_COMMUNITY): Payer: Self-pay | Admitting: Emergency Medicine

## 2018-08-13 DIAGNOSIS — Z833 Family history of diabetes mellitus: Secondary | ICD-10-CM

## 2018-08-13 DIAGNOSIS — Z515 Encounter for palliative care: Secondary | ICD-10-CM | POA: Diagnosis not present

## 2018-08-13 DIAGNOSIS — K219 Gastro-esophageal reflux disease without esophagitis: Secondary | ICD-10-CM | POA: Diagnosis present

## 2018-08-13 DIAGNOSIS — Z87891 Personal history of nicotine dependence: Secondary | ICD-10-CM

## 2018-08-13 DIAGNOSIS — Z7189 Other specified counseling: Secondary | ICD-10-CM

## 2018-08-13 DIAGNOSIS — I120 Hypertensive chronic kidney disease with stage 5 chronic kidney disease or end stage renal disease: Secondary | ICD-10-CM | POA: Diagnosis present

## 2018-08-13 DIAGNOSIS — D631 Anemia in chronic kidney disease: Secondary | ICD-10-CM | POA: Diagnosis present

## 2018-08-13 DIAGNOSIS — R079 Chest pain, unspecified: Secondary | ICD-10-CM | POA: Diagnosis not present

## 2018-08-13 DIAGNOSIS — Z20828 Contact with and (suspected) exposure to other viral communicable diseases: Secondary | ICD-10-CM | POA: Diagnosis present

## 2018-08-13 DIAGNOSIS — I2102 ST elevation (STEMI) myocardial infarction involving left anterior descending coronary artery: Secondary | ICD-10-CM | POA: Diagnosis not present

## 2018-08-13 DIAGNOSIS — N185 Chronic kidney disease, stage 5: Secondary | ICD-10-CM | POA: Diagnosis not present

## 2018-08-13 DIAGNOSIS — I213 ST elevation (STEMI) myocardial infarction of unspecified site: Principal | ICD-10-CM | POA: Diagnosis present

## 2018-08-13 DIAGNOSIS — Z86718 Personal history of other venous thrombosis and embolism: Secondary | ICD-10-CM | POA: Diagnosis not present

## 2018-08-13 DIAGNOSIS — I10 Essential (primary) hypertension: Secondary | ICD-10-CM | POA: Diagnosis not present

## 2018-08-13 DIAGNOSIS — E875 Hyperkalemia: Secondary | ICD-10-CM | POA: Diagnosis present

## 2018-08-13 DIAGNOSIS — N186 End stage renal disease: Secondary | ICD-10-CM

## 2018-08-13 DIAGNOSIS — Z821 Family history of blindness and visual loss: Secondary | ICD-10-CM

## 2018-08-13 DIAGNOSIS — Z5329 Procedure and treatment not carried out because of patient's decision for other reasons: Secondary | ICD-10-CM | POA: Diagnosis present

## 2018-08-13 DIAGNOSIS — Z66 Do not resuscitate: Secondary | ICD-10-CM | POA: Diagnosis not present

## 2018-08-13 DIAGNOSIS — Z992 Dependence on renal dialysis: Secondary | ICD-10-CM

## 2018-08-13 DIAGNOSIS — N400 Enlarged prostate without lower urinary tract symptoms: Secondary | ICD-10-CM | POA: Diagnosis present

## 2018-08-13 DIAGNOSIS — M109 Gout, unspecified: Secondary | ICD-10-CM | POA: Diagnosis present

## 2018-08-13 DIAGNOSIS — Z8049 Family history of malignant neoplasm of other genital organs: Secondary | ICD-10-CM | POA: Diagnosis not present

## 2018-08-13 LAB — CBC WITH DIFFERENTIAL/PLATELET
Abs Immature Granulocytes: 0.06 10*3/uL (ref 0.00–0.07)
Basophils Absolute: 0.1 10*3/uL (ref 0.0–0.1)
Basophils Relative: 1 %
Eosinophils Absolute: 0 10*3/uL (ref 0.0–0.5)
Eosinophils Relative: 0 %
HCT: 30.1 % — ABNORMAL LOW (ref 39.0–52.0)
Hemoglobin: 10.3 g/dL — ABNORMAL LOW (ref 13.0–17.0)
Immature Granulocytes: 1 %
Lymphocytes Relative: 14 %
Lymphs Abs: 1.5 10*3/uL (ref 0.7–4.0)
MCH: 30.2 pg (ref 26.0–34.0)
MCHC: 34.2 g/dL (ref 30.0–36.0)
MCV: 88.3 fL (ref 80.0–100.0)
Monocytes Absolute: 0.9 10*3/uL (ref 0.1–1.0)
Monocytes Relative: 8 %
Neutro Abs: 8.5 10*3/uL — ABNORMAL HIGH (ref 1.7–7.7)
Neutrophils Relative %: 76 %
Platelets: 291 10*3/uL (ref 150–400)
RBC: 3.41 MIL/uL — ABNORMAL LOW (ref 4.22–5.81)
RDW: 13.7 % (ref 11.5–15.5)
WBC: 11.1 10*3/uL — ABNORMAL HIGH (ref 4.0–10.5)
nRBC: 0 % (ref 0.0–0.2)

## 2018-08-13 LAB — SARS CORONAVIRUS 2 BY RT PCR (HOSPITAL ORDER, PERFORMED IN ~~LOC~~ HOSPITAL LAB): SARS Coronavirus 2: NEGATIVE

## 2018-08-13 LAB — I-STAT CHEM 8, ED
BUN: 64 mg/dL — ABNORMAL HIGH (ref 8–23)
Calcium, Ion: 1.17 mmol/L (ref 1.15–1.40)
Chloride: 107 mmol/L (ref 98–111)
Creatinine, Ser: 10.6 mg/dL — ABNORMAL HIGH (ref 0.61–1.24)
Glucose, Bld: 123 mg/dL — ABNORMAL HIGH (ref 70–99)
HCT: 32 % — ABNORMAL LOW (ref 39.0–52.0)
Hemoglobin: 10.9 g/dL — ABNORMAL LOW (ref 13.0–17.0)
Potassium: 6.3 mmol/L (ref 3.5–5.1)
Sodium: 133 mmol/L — ABNORMAL LOW (ref 135–145)
TCO2: 18 mmol/L — ABNORMAL LOW (ref 22–32)

## 2018-08-13 LAB — LIPID PANEL
Cholesterol: 120 mg/dL (ref 0–200)
HDL: 43 mg/dL (ref >40)
LDL Cholesterol: 60 mg/dL (ref 0–99)
Total CHOL/HDL Ratio: 2.8 RATIO
Triglycerides: 83 mg/dL (ref <150)
VLDL: 17 mg/dL (ref 0–40)

## 2018-08-13 LAB — COMPREHENSIVE METABOLIC PANEL
ALT: 19 U/L (ref 0–44)
AST: 37 U/L (ref 15–41)
Albumin: 3.4 g/dL — ABNORMAL LOW (ref 3.5–5.0)
Alkaline Phosphatase: 104 U/L (ref 38–126)
Anion gap: 9 (ref 5–15)
BUN: 69 mg/dL — ABNORMAL HIGH (ref 8–23)
CO2: 17 mmol/L — ABNORMAL LOW (ref 22–32)
Calcium: 8.7 mg/dL — ABNORMAL LOW (ref 8.9–10.3)
Chloride: 106 mmol/L (ref 98–111)
Creatinine, Ser: 9.1 mg/dL — ABNORMAL HIGH (ref 0.61–1.24)
GFR calc Af Amer: 5 mL/min — ABNORMAL LOW (ref >60)
GFR calc non Af Amer: 5 mL/min — ABNORMAL LOW (ref >60)
Glucose, Bld: 124 mg/dL — ABNORMAL HIGH (ref 70–99)
Potassium: 6.8 mmol/L (ref 3.5–5.1)
Sodium: 132 mmol/L — ABNORMAL LOW (ref 135–145)
Total Bilirubin: 0.8 mg/dL (ref 0.3–1.2)
Total Protein: 7.9 g/dL (ref 6.5–8.1)

## 2018-08-13 LAB — PROTIME-INR
INR: 1.1 (ref 0.8–1.2)
Prothrombin Time: 13.9 seconds (ref 11.4–15.2)

## 2018-08-13 LAB — BASIC METABOLIC PANEL
Anion gap: 10 (ref 5–15)
BUN: 65 mg/dL — ABNORMAL HIGH (ref 8–23)
CO2: 16 mmol/L — ABNORMAL LOW (ref 22–32)
Calcium: 8.7 mg/dL — ABNORMAL LOW (ref 8.9–10.3)
Chloride: 106 mmol/L (ref 98–111)
Creatinine, Ser: 9.59 mg/dL — ABNORMAL HIGH (ref 0.61–1.24)
GFR calc Af Amer: 5 mL/min — ABNORMAL LOW (ref >60)
GFR calc non Af Amer: 4 mL/min — ABNORMAL LOW (ref >60)
Glucose, Bld: 95 mg/dL (ref 70–99)
Potassium: 5.8 mmol/L — ABNORMAL HIGH (ref 3.5–5.1)
Sodium: 132 mmol/L — ABNORMAL LOW (ref 135–145)

## 2018-08-13 LAB — TROPONIN I: Troponin I: 23.26 ng/mL (ref <0.03)

## 2018-08-13 LAB — APTT: aPTT: 26 seconds (ref 24–36)

## 2018-08-13 SURGERY — CORONARY/GRAFT ACUTE MI REVASCULARIZATION
Anesthesia: LOCAL

## 2018-08-13 MED ORDER — HEPARIN (PORCINE) 25000 UT/250ML-% IV SOLN
850.0000 [IU]/h | INTRAVENOUS | Status: DC
  Administered 2018-08-13: 16:00:00 850 [IU]/h via INTRAVENOUS
  Filled 2018-08-13: qty 250

## 2018-08-13 MED ORDER — ONDANSETRON HCL 4 MG/2ML IJ SOLN: 4.0000 mg | Freq: Four times a day (QID) | INTRAMUSCULAR | Status: DC | PRN

## 2018-08-13 MED ORDER — HALOPERIDOL LACTATE 5 MG/ML IJ SOLN: 0.5000 mg | INTRAMUSCULAR | Status: DC | PRN

## 2018-08-13 MED ORDER — SODIUM ZIRCONIUM CYCLOSILICATE 10 G PO PACK
10.0000 g | PACK | Freq: Once | ORAL | Status: AC
  Administered 2018-08-13: 10 g via ORAL
  Filled 2018-08-13: qty 1

## 2018-08-13 MED ORDER — SODIUM CHLORIDE 0.9% FLUSH: 3.0000 mL | INTRAVENOUS | Status: DC | PRN

## 2018-08-13 MED ORDER — PANTOPRAZOLE SODIUM 40 MG PO TBEC
40.0000 mg | DELAYED_RELEASE_TABLET | Freq: Every day | ORAL | Status: DC
  Administered 2018-08-13 – 2018-08-15 (×3): 40 mg via ORAL
  Filled 2018-08-13 (×3): qty 1

## 2018-08-13 MED ORDER — GLYCOPYRROLATE 0.2 MG/ML IJ SOLN: 0.2000 mg | INTRAMUSCULAR | Status: DC | PRN

## 2018-08-13 MED ORDER — LORAZEPAM 2 MG/ML IJ SOLN: 1.0000 mg | INTRAMUSCULAR | Status: DC | PRN

## 2018-08-13 MED ORDER — INSULIN ASPART 100 UNIT/ML ~~LOC~~ SOLN
2.0000 [IU] | Freq: Once | SUBCUTANEOUS | Status: AC
  Administered 2018-08-13: 16:00:00 2 [IU] via SUBCUTANEOUS

## 2018-08-13 MED ORDER — DIPHENHYDRAMINE HCL 50 MG/ML IJ SOLN: 12.5000 mg | INTRAMUSCULAR | Status: DC | PRN

## 2018-08-13 MED ORDER — ASPIRIN 81 MG PO CHEW
324.0000 mg | CHEWABLE_TABLET | Freq: Once | ORAL | Status: AC
  Administered 2018-08-13: 16:00:00 324 mg via ORAL
  Filled 2018-08-13: qty 4

## 2018-08-13 MED ORDER — HEPARIN SODIUM (PORCINE) 5000 UNIT/ML IJ SOLN
4000.0000 [IU] | Freq: Once | INTRAMUSCULAR | Status: DC
  Filled 2018-08-13: qty 0.8

## 2018-08-13 MED ORDER — LORAZEPAM 1 MG PO TABS: 1.0000 mg | ORAL_TABLET | ORAL | Status: DC | PRN

## 2018-08-13 MED ORDER — DEXTROSE 50 % IV SOLN
50.0000 mL | Freq: Once | INTRAVENOUS | Status: AC
  Administered 2018-08-13: 16:00:00 50 mL via INTRAVENOUS
  Filled 2018-08-13: qty 50

## 2018-08-13 MED ORDER — GLYCOPYRROLATE 1 MG PO TABS
1.0000 mg | ORAL_TABLET | ORAL | Status: DC | PRN
  Filled 2018-08-13: qty 1

## 2018-08-13 MED ORDER — BISACODYL 10 MG RE SUPP: 10.0000 mg | Freq: Every day | RECTAL | Status: DC | PRN

## 2018-08-13 MED ORDER — ONDANSETRON 4 MG PO TBDP: 4.0000 mg | ORAL_TABLET | Freq: Four times a day (QID) | ORAL | Status: DC | PRN

## 2018-08-13 MED ORDER — SODIUM CHLORIDE 0.9 % IV SOLN
12.5000 mg | Freq: Four times a day (QID) | INTRAVENOUS | Status: DC | PRN
  Filled 2018-08-13: qty 0.5

## 2018-08-13 MED ORDER — SODIUM CHLORIDE 0.9 % IV SOLN
1.0000 g | Freq: Once | INTRAVENOUS | Status: AC
  Administered 2018-08-13: 16:00:00 1 g via INTRAVENOUS
  Filled 2018-08-13: qty 10

## 2018-08-13 MED ORDER — HALOPERIDOL LACTATE 2 MG/ML PO CONC
0.5000 mg | ORAL | Status: DC | PRN
  Filled 2018-08-13: qty 0.3

## 2018-08-13 MED ORDER — SODIUM CHLORIDE 0.9 % IV SOLN
INTRAVENOUS | Status: DC
  Administered 2018-08-13: 16:00:00 via INTRAVENOUS

## 2018-08-13 MED ORDER — ACETAMINOPHEN 325 MG PO TABS: 650.0000 mg | ORAL_TABLET | Freq: Four times a day (QID) | ORAL | Status: DC | PRN

## 2018-08-13 MED ORDER — ALUM & MAG HYDROXIDE-SIMETH 200-200-20 MG/5ML PO SUSP: 30.0000 mL | Freq: Four times a day (QID) | ORAL | Status: DC | PRN

## 2018-08-13 MED ORDER — MORPHINE SULFATE (PF) 2 MG/ML IV SOLN: 2.0000 mg | INTRAVENOUS | Status: DC | PRN

## 2018-08-13 MED ORDER — TEMAZEPAM 7.5 MG PO CAPS: 7.5000 mg | ORAL_CAPSULE | Freq: Every evening | ORAL | Status: DC | PRN

## 2018-08-13 MED ORDER — CYANOCOBALAMIN 1000 MCG/ML IJ KIT: 1000.0000 ug | PACK | INTRAMUSCULAR | Status: DC

## 2018-08-13 MED ORDER — SODIUM CHLORIDE 0.9% FLUSH
3.0000 mL | Freq: Two times a day (BID) | INTRAVENOUS | Status: DC
  Administered 2018-08-13 – 2018-08-14 (×3): 3 mL via INTRAVENOUS

## 2018-08-13 MED ORDER — SODIUM CHLORIDE 0.9 % IV SOLN: 250.0000 mL | INTRAVENOUS | Status: DC | PRN

## 2018-08-13 MED ORDER — SODIUM BICARBONATE 8.4 % IV SOLN
50.0000 meq | Freq: Once | INTRAVENOUS | Status: AC
  Administered 2018-08-13: 16:00:00 50 meq via INTRAVENOUS
  Filled 2018-08-13: qty 50

## 2018-08-13 MED ORDER — OXYBUTYNIN CHLORIDE 5 MG PO TABS: 2.5000 mg | ORAL_TABLET | Freq: Four times a day (QID) | ORAL | Status: DC | PRN

## 2018-08-13 MED ORDER — POLYVINYL ALCOHOL 1.4 % OP SOLN
1.0000 [drp] | Freq: Four times a day (QID) | OPHTHALMIC | Status: DC | PRN
  Filled 2018-08-13: qty 15

## 2018-08-13 MED ORDER — HALOPERIDOL 1 MG PO TABS
0.5000 mg | ORAL_TABLET | ORAL | Status: DC | PRN
  Filled 2018-08-13: qty 0.5

## 2018-08-13 MED ORDER — OXYCODONE HCL 5 MG PO TABS: 5.0000 mg | ORAL_TABLET | ORAL | Status: DC | PRN

## 2018-08-13 MED ORDER — ACETAMINOPHEN 650 MG RE SUPP: 650.0000 mg | Freq: Four times a day (QID) | RECTAL | Status: DC | PRN

## 2018-08-13 MED ORDER — LOPERAMIDE HCL 2 MG PO CAPS: 2.0000 mg | ORAL_CAPSULE | ORAL | Status: DC | PRN

## 2018-08-13 MED ORDER — BIOTENE DRY MOUTH MT LIQD: 15.0000 mL | OROMUCOSAL | Status: DC | PRN

## 2018-08-13 MED ORDER — LORAZEPAM 2 MG/ML PO CONC: 1.0000 mg | ORAL | Status: DC | PRN

## 2018-08-13 MED ORDER — LISINOPRIL 2.5 MG PO TABS: 2.5000 mg | ORAL_TABLET | Freq: Every day | ORAL | Status: DC

## 2018-08-13 NOTE — ED Provider Notes (Signed)
[facility] DEPT Provider Note   CSN: [id number] Arrival date & time: 08/13/18  1505    History   Chief Complaint Chief Complaint  Patient presents with  . Code STEMI    HPI Caleb Hayes is a 85 y.o. male.     HPI  85 year old male presents with chest pain.  Overall the patient is a very poor historian.  He states that he has been having chest pain on and off for about 3 days.  It feels like his chest is cold.  It is worse when he takes a deep breath.  Is not that bad right now.  There is some associated shortness of breath.  No prior cardiac disease.  Devious history of anemia requiring transfusions but none recently.  Past Medical History:  Diagnosis Date  . Gout   . Hypertension     Patient Active Problem List   Diagnosis Date Noted  . BPH (benign prostatic hyperplasia) 11/27/2015  . CKD stage 5 secondary to hypertension ([place]) 11/27/2015  . UTI (urinary tract infection) 11/11/2015  . AKI (acute kidney injury) ([place]) 11/11/2015  . Pernicious anemia 07/04/2015  . DVT (deep venous thrombosis) ([place]) 05/20/2013  . Acute-on-chronic kidney injury ([place]) 05/17/2013  . Essential hypertension, benign 05/17/2013  . Gout 05/17/2013  . Urinary retention 05/17/2013  . Leg swelling 05/17/2013  . Poor appetite 05/17/2013  . Unspecified constipation 05/17/2013  . Anemia 05/16/2013  . Symptomatic anemia 05/16/2013    History reviewed. No pertinent surgical history.      Home Medications    Prior to Admission medications   Medication Sig Start Date End Date Taking? Authorizing Provider  lisinopril (ZESTRIL) 2.5 MG tablet Take 1 tablet (2.5 mg total) by mouth daily. 03/18/18   Magrinat, Virgie Dad, MD    Family History Family History  Problem Relation Age of Onset  . Cervical cancer Mother   . Diabetes Father   . Vision loss Paternal Grandfather     Social History Social History   Tobacco Use  . Smoking status: Former Research scientist (life sciences)  .  Smokeless tobacco: Never Used  Substance Use Topics  . Alcohol use: No  . Drug use: No     Allergies   Patient has no known allergies.   Review of Systems Review of Systems  Constitutional: Negative for fever.  Respiratory: Positive for shortness of breath. Negative for cough.   Cardiovascular: Positive for chest pain. Negative for leg swelling.  Gastrointestinal: Negative for abdominal pain.  All other systems reviewed and are negative.    Physical Exam Updated Vital Signs BP 115/78 (BP Location: Left Arm)   Pulse (!) 103   Temp 99.3 F (37.4 C) (Oral)   Resp 16   SpO2 100%   Physical Exam Vitals signs and nursing note reviewed.  Constitutional:      General: He is not in acute distress.    Appearance: He is well-developed. He is not ill-appearing.  HENT:     Head: Normocephalic and atraumatic.     Right Ear: External ear normal.     Left Ear: External ear normal.     Nose: Nose normal.  Eyes:     General:        Right eye: No discharge.        Left eye: No discharge.  Neck:     Musculoskeletal: Neck supple.  Cardiovascular:     Rate and Rhythm: Regular rhythm. Tachycardia present.     Pulses:  Radial pulses are 2+ on the right side and 2+ on the left side.     Heart sounds: Normal heart sounds.  Pulmonary:     Effort: Pulmonary effort is normal.     Breath sounds: Normal breath sounds.  Abdominal:     Palpations: Abdomen is soft.     Tenderness: There is no abdominal tenderness.  Musculoskeletal:     Right lower leg: No edema.     Left lower leg: No edema.  Skin:    General: Skin is warm and dry.  Neurological:     Mental Status: He is alert.  Psychiatric:        Mood and Affect: Mood is not anxious.      ED Treatments / Results  Labs (all labs ordered are listed, but only abnormal results are displayed) Labs Reviewed  SARS CORONAVIRUS 2 (HOSPITAL ORDER, [facility])  CBC WITH DIFFERENTIAL/PLATELET   PROTIME-INR  APTT  COMPREHENSIVE METABOLIC PANEL  TROPONIN I  LIPID PANEL  I-STAT CHEM 8, ED    EKG EKG Interpretation  Date/Time:  Thursday August 13 2018 15:16:50 EDT Ventricular Rate:  103 PR Interval:    QRS Duration: 87 QT Interval:  296 QTC Calculation: 388 R Axis:     Text Interpretation:  Sinus tachycardia Probable left atrial enlargement Left ventricular hypertrophy Probable anterolateral infarct, acute ** ** ACUTE MI / STEMI ** ** Confirmed by Sherwood Gambler [phone]) on 08/13/2018 3:27:04 PM   Radiology No results found.  Procedures .Critical Care Performed by: Sherwood Gambler, MD Authorized by: Sherwood Gambler, MD   Critical care provider statement:    Critical care time (minutes):  25   Critical care time was exclusive of:  Separately billable procedures and treating other patients   Critical care was necessary to treat or prevent imminent or life-threatening deterioration of the following conditions:  Cardiac failure   Critical care was time spent personally by me on the following activities:  Discussions with consultants, evaluation of patient's response to treatment, examination of patient, ordering and performing treatments and interventions, ordering and review of laboratory studies, ordering and review of radiographic studies, pulse oximetry, re-evaluation of patient's condition, obtaining history from patient or surrogate and review of old charts   (including critical care time)  Medications Ordered in ED Medications  0.9 %  sodium chloride infusion (has no administration in time range)  heparin injection 4,000 Units (has no administration in time range)  aspirin chewable tablet 324 mg (324 mg Oral Given 08/13/18 1535)     Initial Impression / Assessment and Plan / ED Course  I have reviewed the triage vital signs and the nursing notes.  Pertinent labs & imaging results that were available during my care of the patient were reviewed by me and considered  in my medical decision making (see chart for details).  Clinical Course as of Aug 13 1535  Thu Aug 13, 2018  1531 D/w End, patient has chronic renal failure and may go on dialysis from the catheterization.  The patient is awake and alert and does not appear altered but he appears to have some poor medical literacy.  Plan is for aspirin, heparin, and [facility] transfer to the emergency department.  I discussed with Dr. Kathrynn Humble, EDP who is aware.  Dr. Saunders Revel will see the patient in the ER to decide about catheterization when talking with patient.   [SG]    Clinical Course User Index [SG] Sherwood Gambler, MD  Patient is an overall poor historian and his presentation is quite atypical but his ECG is very concerning for STEMI.  With his previous history I would also consider PE, however with such dramatic ECG changes I think MI is much more likely.  I discussed transfer and possible catheterization with patient.  He he was given aspirin.  Delay in pharmacy transporting heparin to [place] and so because [facility] is here, heparin will defer to be given at [facility] so as not to delay his transport.  Final Clinical Impressions(s) / ED Diagnoses   Final diagnoses:  ST elevation myocardial infarction (STEMI), unspecified artery [facility])    ED Discharge Orders    None       Sherwood Gambler, MD 08/13/18 1540

## 2018-08-13 NOTE — ED Notes (Signed)
Code STEMI called to [facility]; RN/MD advised. [employer]

## 2018-08-13 NOTE — Consult Note (Signed)
[facility] Admit Date: 08/13/2018 08/13/2018 [facility] Agent Requesting Physician:  Darl Householder MD  Reason for Consult:  Progressive Renal Failure HPI:  85 year old male with longstanding CKD 5 previously followed by Dr. Mercy Moore but not seen since 2017, BPH with chronic indwelling Foley catheter, gout, hypertension who presented to the emergency room with intermittent chest pain and shortness of breath.  EKG findings of STEMI and cardiology was consulted.  He also was found to have a creatinine of 9.1 and potassium of 6.3.  His previous notes with Dr. Mercy Moore have been reviewed and he was adamant that he would never received hemodialysis at that time.  He has several family members were received dialysis and is aware of the difficulties associated with it.  He also clearly recognizes and expresses that if dialysis is not provided, renal failure is fatal.  Further, I have reviewed Dr. Virgie Dad notes where he also expresses this desire not to receive hemodialysis.  Patient was considered for cardiac catheterization but given his renal failure and the above issues medical management was decided upon.  I reviewed this with him and once again he confirms no interest in dialysis and recognition that renal failure could be fatal.   Creatinine (mg/dL)  Date Value  10/23/2016 6.5 (HH)  09/25/2016 6.5 (HH)  08/28/2016 7.2 (HH)  07/04/2015 3.5 (HH)  08/05/2014 5.6 (HH)  07/01/2014 4.1 (HH)  06/18/2013 2.6 (H)   Creatinine, Ser (mg/dL)  Date Value  08/13/2018 10.60 (H)  08/13/2018 9.10 (H)  10/01/2016 6.05 (HH)  11/15/2015 4.87 (H)  11/14/2015 5.43 (H)  11/13/2015 5.91 (H)  11/12/2015 6.65 (H)  11/11/2015 6.72 (H)  11/11/2015 6.40 (H)  05/21/2013 2.95 (H)  ] ROS NSAIDS: No identified exposure IV Contrast no identified exposure TMP/SMX no identified exposure Hypotension not present Balance of 12 systems is negative w/ exceptions as above  PMH  Past Medical History:  Diagnosis Date   . Gout   . Hypertension    PSH History reviewed. No pertinent surgical history. FH  Family History  Problem Relation Age of Onset  . Cervical cancer Mother   . Diabetes Father   . Vision loss Paternal Jon Gills    Roby  reports that he has quit smoking. He has never used smokeless tobacco. He reports that he does not drink alcohol or use drugs. Allergies No Known Allergies Home medications Prior to Admission medications   Medication Sig Start Date End Date Taking? Authorizing Provider  lisinopril (ZESTRIL) 2.5 MG tablet Take 1 tablet (2.5 mg total) by mouth daily. 03/18/18   Magrinat, Virgie Dad, MD    Current Medications Scheduled Meds: . sodium zirconium cyclosilicate  10 g Oral Once   Continuous Infusions: . sodium chloride 20 mL/hr at 08/13/18 1621  . calcium gluconate 1 GM IVPB 1 g (08/13/18 1622)  . heparin 850 Units/hr (08/13/18 1606)   PRN Meds:.  CBC Recent Labs  Lab 08/13/18 1533 08/13/18 1541  WBC 11.1*  --   NEUTROABS 8.5*  --   HGB 10.3* 10.9*  HCT 30.1* 32.0*  MCV 88.3  --   PLT 291  --    Basic Metabolic Panel Recent Labs  Lab 08/13/18 1533 08/13/18 1541  NA 132* 133*  K 6.8* 6.3*  CL 106 107  CO2 17*  --   GLUCOSE 124* 123*  BUN 69* 64*  CREATININE 9.10* 10.60*  CALCIUM 8.7*  --     Physical Exam  Blood pressure (!) 149/74, pulse 93, temperature 99.3 F (  37.4 C), temperature source Oral, resp. rate (!) 24, height 5\' 6"  (1.676 m), weight 70.3 kg, SpO2 99 %. GEN: Well-appearing, cogent, no distress ENT: NCAT EYES: EOMI CV: RRR, normal S1 and S2, no rub PULM: Clear bilaterally, no labored breathing ABD: Soft, nontender, no suprapubic fullness GU: Normal male genitalia with Foley catheter in place with strapped to thigh SKIN: No rashes or lesions EXT: No lower extremity edema NEURO: Nonfocal, alert and oriented x3  Assessment 85 year old male with progressive renal failure, stage V CKD, presenting with STEMI.  1. Progressive CKD-5:  Patient clear and his desire not to pursue hemodialysis.  Discussed that he is a candidate for hospice where they can focus on making sure he has high-quality days.  He is interested in this. 2. Hyperkalemia, has received Lokelma, insulin/dextrose, sodium bicarbonate; driven by #1 and use of ACE inhibitor  3. STEMI, opted for medical management 4. BPH with chronic indwelling Foley catheter  Plan 1. Check BMP at 8 PM to follow-up hyperkalemia, re-dose Lokelma as needed 2. Recommend consulting palliative medicine and consideration of hospice 3. Will check in on patient again tomorrow 4. Hold ACE inhibitor  Pearson Grippe MD 08/13/2018, 4:51 PM

## 2018-08-13 NOTE — ED Triage Notes (Signed)
Patient c/o central chest pain with SOB x3 days. States pain worsens with breathing. Denies N/V/D.

## 2018-08-13 NOTE — ED Provider Notes (Signed)
  Physical Exam  BP (!) 149/74   Pulse 93   Temp 99.3 F (37.4 C) (Oral)   Resp (!) 24   Ht 5\' 6"  (1.676 m)   Wt 70.3 kg   SpO2 99%   BMI 25.02 kg/m   Physical Exam  ED Course/Procedures   Clinical Course as of Aug 12 1724  Thu Aug 13, 2018  1531 D/w End, patient has chronic renal failure and may go on dialysis from the catheterization.  The patient is awake and alert and does not appear altered but he appears to have some poor medical literacy.  Plan is for aspirin, heparin, and [facility] transfer to the emergency department.  I discussed with Dr. Kathrynn Humble, EDP who is aware.  Dr. Saunders Revel will see the patient in the ER to decide about catheterization when talking with patient.   [SG]    Clinical Course User Index [SG] Sherwood Gambler, MD    Procedures  MDM  Patient was transferred from [facility] long for STEMI.  However, patient's potassium is 6.3 and patient is a renal failure with a creatinine of 10.  He has underlying renal disease and patient states that he saw nephrology who discussed with him about dialysis many years ago and he absolutely does not want to start dialysis for any reason.  We discussed extensively about cardiac catheterization that will likely result him being on dialysis and he absolutely does not want that.  Dr. Saunders Revel and I also discussed CODE STATUS and patient wants to be DNR.  We will proceed with medical management right now with heparin drip and aspirin.  I also discussed with Dr. Joelyn Oms from nephrology about management of renal failure. He agreed with bicarb, calcium, lokelma, insulin. He will see patient. Hospitalist to admit for hyperkalemia, renal failure, STEMI.   CRITICAL CARE Performed by: Wandra Arthurs   Total critical care time: 30 minutes  Critical care time was exclusive of separately billable procedures and treating other patients.  Critical care was necessary to treat or prevent imminent or life-threatening deterioration.  Critical care was time  spent personally by me on the following activities: development of treatment plan with patient and/or surrogate as well as nursing, discussions with consultants, evaluation of patient's response to treatment, examination of patient, obtaining history from patient or surrogate, ordering and performing treatments and interventions, ordering and review of laboratory studies, ordering and review of radiographic studies, pulse oximetry and re-evaluation of patient's condition.       Drenda Freeze, MD 08/13/18 1728

## 2018-08-13 NOTE — Progress Notes (Addendum)
ANTICOAGULATION CONSULT NOTE - Initial Consult  Pharmacy Consult for heparin Indication: ACS/STEMI  No Known Allergies  Patient Measurements: Height: 5\' 6"  (167.6 cm) Weight: 155 lb (70.3 kg) IBW/kg (Calculated) : 63.8 HEPARIN DW (KG): 70.3  Vital Signs: Temp: 99.3 F (37.4 C) (06/04 1525) Temp Source: Oral (06/04 1525) BP: 159/82 (06/04 1600) Pulse Rate: 94 (06/04 1600)  Labs: Recent Labs    08/13/18 1533 08/13/18 1541  HGB 10.3* 10.9*  HCT 30.1* 32.0*  PLT 291  --   APTT 26  --   LABPROT 13.9  --   INR 1.1  --   CREATININE  --  10.60*    Estimated Creatinine Clearance: 4.5 mL/min (A) (by C-G formula based on SCr of 10.6 mg/dL (H)).   Medical History: Past Medical History:  Diagnosis Date  . Gout   . Hypertension     Assessment: Caleb Hayes is an 85yo male admitted with ACS/STEMI. Pharmacy has been consulted to start heparin without bolus. No anticoagulants PTA. Hgb 10.3 and pltc 291.  Goal of Therapy:  Heparin level 0.3-0.7 units/ml Monitor platelets by anticoagulation protocol: Yes   Plan:  No bolus per MD request Start heparin 850 units/hr Heparin level at 2300 Monitor daily heparin level and aPTT  Thank you for involving pharmacy in this patient's care.  Janae Bridgeman, PharmD PGY1 Pharmacy Resident Phone: [phone] 08/13/2018 4:08 PM

## 2018-08-13 NOTE — ED Notes (Signed)
ED TO INPATIENT HANDOFF REPORT  ED Nurse Name and Phone #: [phone]  S Name/Age/Gender Caleb Hayes 85 y.o. male Room/Bed: TRABC/TRABC  Code Status   Code Status: DNR  Home/SNF/Other   Is this baseline? yes  Triage Complete: Triage complete  Chief Complaint Chest pain  Triage Note Patient c/o central chest pain with SOB x3 days. States pain worsens with breathing. Denies N/V/D.   Allergies No Known Allergies  Level of Care/Admitting Diagnosis ED Disposition    ED Disposition Condition [facility] Area: [place] [[id number]]  Level of Care: Palliative Care [15]  Covid Evaluation: Screening Protocol (No Symptoms)  Diagnosis: STEMI (ST elevation myocardial infarction) [facility]) [[id number]]  Admitting Physician: Lady Deutscher [[id number]]  Attending Physician: Lady Deutscher [[id number]]  Estimated length of stay: 3 - 4 days  Certification:: I certify this patient will need inpatient services for at least 2 midnights  PT Class (Do Not Modify): Inpatient [101]  PT Acc Code (Do Not Modify): Private [1]       B Medical/Surgery History Past Medical History:  Diagnosis Date  . Gout   . Hypertension    History reviewed. No pertinent surgical history.   A IV Location/Drains/Wounds Patient Lines/Drains/Airways Status   Active Line/Drains/Airways    Name:   Placement date:   Placement time:   Site:   Days:   Peripheral IV 08/13/18 Left Forearm   08/13/18    1536    Forearm   less than 1   Peripheral IV 08/13/18 Left Antecubital   08/13/18    1601    Antecubital   less than 1          Intake/Output Last 24 hours No intake or output data in the 24 hours ending 08/13/18 1813  Labs/Imaging Results for orders placed or performed during the hospital encounter of 08/13/18 (from the past 97 hour(s))  SARS Coronavirus 2 (CEPHEID - Performed in [place] hospital lab), Hosp Order     Status: None   Collection Time: 08/13/18  3:20 PM   Result Value Ref Range   SARS Coronavirus 2 NEGATIVE NEGATIVE    Comment: (NOTE) If result is NEGATIVE SARS-CoV-2 target nucleic acids are NOT DETECTED. The SARS-CoV-2 RNA is generally detectable in upper and lower  respiratory specimens during the acute phase of infection. The lowest  concentration of SARS-CoV-2 viral copies this assay can detect is 250  copies / mL. A negative result does not preclude SARS-CoV-2 infection  and should not be used as the sole basis for treatment or other  patient management decisions.  A negative result may occur with  improper specimen collection / handling, submission of specimen other  than nasopharyngeal swab, presence of viral mutation(s) within the  areas targeted by this assay, and inadequate number of viral copies  (<250 copies / mL). A negative result must be combined with clinical  observations, patient history, and epidemiological information. If result is POSITIVE SARS-CoV-2 target nucleic acids are DETECTED. The SARS-CoV-2 RNA is generally detectable in upper and lower  respiratory specimens dur ing the acute phase of infection.  Positive  results are indicative of active infection with SARS-CoV-2.  Clinical  correlation with patient history and other diagnostic information is  necessary to determine patient infection status.  Positive results do  not rule out bacterial infection or co-infection with other viruses. If result is PRESUMPTIVE POSTIVE SARS-CoV-2 nucleic acids MAY BE PRESENT.   A presumptive positive result  was obtained on the submitted specimen  and confirmed on repeat testing.  While 2019 novel coronavirus  (SARS-CoV-2) nucleic acids may be present in the submitted sample  additional confirmatory testing may be necessary for epidemiological  and / or clinical management purposes  to differentiate between  SARS-CoV-2 and other Sarbecovirus currently known to infect humans.  If clinically indicated additional testing with  an alternate test  methodology [phone]) is advised. The SARS-CoV-2 RNA is generally  detectable in upper and lower respiratory sp ecimens during the acute  phase of infection. The expected result is Negative. Fact Sheet for Patients:  StrictlyIdeas.no Fact Sheet for Healthcare Providers: BankingDealers.co.za This test is not yet approved or cleared by the [place] FDA and has been authorized for detection and/or diagnosis of SARS-CoV-2 by FDA under an Emergency Use Authorization (EUA).  This EUA will remain in effect (meaning this test can be used) for the duration of the COVID-19 declaration under Section 564(b)(1) of the Act, 21 U.S.C. section 360bbb-3(b)(1), unless the authorization is terminated or revoked sooner. Performed at [facility], [place] [address] [place], [place] [address]   CBC with Differential/Platelet     Status: Abnormal   Collection Time: 08/13/18  3:33 PM  Result Value Ref Range   WBC 11.1 (H) 4.0 - 10.5 K/uL   RBC 3.41 (L) 4.22 - 5.81 MIL/uL   Hemoglobin 10.3 (L) 13.0 - 17.0 g/dL   HCT 30.1 (L) 39.0 - 52.0 %   MCV 88.3 80.0 - 100.0 fL   MCH 30.2 26.0 - 34.0 pg   MCHC 34.2 30.0 - 36.0 g/dL   RDW 13.7 11.5 - 15.5 %   Platelets 291 150 - 400 K/uL   nRBC 0.0 0.0 - 0.2 %   Neutrophils Relative % 76 %   Neutro Abs 8.5 (H) 1.7 - 7.7 K/uL   Lymphocytes Relative 14 %   Lymphs Abs 1.5 0.7 - 4.0 K/uL   Monocytes Relative 8 %   Monocytes Absolute 0.9 0.1 - 1.0 K/uL   Eosinophils Relative 0 %   Eosinophils Absolute 0.0 0.0 - 0.5 K/uL   Basophils Relative 1 %   Basophils Absolute 0.1 0.0 - 0.1 K/uL   Immature Granulocytes 1 %   Abs Immature Granulocytes 0.06 0.00 - 0.07 K/uL    Comment: Performed at [facility], [place] [address], [place], [place] [address]  Protime-INR     Status: None   Collection Time: 08/13/18  3:33 PM  Result Value Ref Range   Prothrombin Time 13.9 11.4  - 15.2 seconds   INR 1.1 0.8 - 1.2    Comment: (NOTE) INR goal varies based on device and disease states. Performed at [facility], [place] [address] [place], [place] [address]   APTT     Status: None   Collection Time: 08/13/18  3:33 PM  Result Value Ref Range   aPTT 26 24 - 36 seconds    Comment: Performed at [facility], [place] [address], [place], [place] [address]  Comprehensive metabolic panel     Status: Abnormal   Collection Time: 08/13/18  3:33 PM  Result Value Ref Range   Sodium 132 (L) 135 - 145 mmol/L   Potassium 6.8 (HH) 3.5 - 5.1 mmol/L    Comment: CRITICAL RESULT CALLED TO, READ BACK BY AND VERIFIED WITH: C.HALL AT 1625 ON 08/13/18 BY N.THOMPSON NO VISIBLE HEMOLYSIS    Chloride 106 98 - 111 mmol/L  CO2 17 (L) 22 - 32 mmol/L   Glucose, Bld 124 (H) 70 - 99 mg/dL   BUN 69 (H) 8 - 23 mg/dL   Creatinine, Ser 9.10 (H) 0.61 - 1.24 mg/dL   Calcium 8.7 (L) 8.9 - 10.3 mg/dL   Total Protein 7.9 6.5 - 8.1 g/dL   Albumin 3.4 (L) 3.5 - 5.0 g/dL   AST 37 15 - 41 U/L   ALT 19 0 - 44 U/L   Alkaline Phosphatase 104 38 - 126 U/L   Total Bilirubin 0.8 0.3 - 1.2 mg/dL   GFR calc non Af Amer 5 (L) >60 mL/min   GFR calc Af Amer 5 (L) >60 mL/min   Anion gap 9 5 - 15    Comment: Performed at [facility], [place] [address], [place], [place] [address]  Troponin I - ONCE - STAT     Status: Abnormal   Collection Time: 08/13/18  3:33 PM  Result Value Ref Range   Troponin I 23.26 (HH) <0.03 ng/mL    Comment: CRITICAL RESULT CALLED TO, READ BACK BY AND VERIFIED WITH: C.HALL AT 1625 ON 08/13/18 BY N.THOMPSON Performed at [facility], [place] [address], [place], [place] [address]   Lipid panel     Status: None   Collection Time: 08/13/18  3:33 PM  Result Value Ref Range   Cholesterol 120 0 - 200 mg/dL   Triglycerides 83 <150 mg/dL   HDL 43 >40 mg/dL   Total CHOL/HDL Ratio 2.8 RATIO   VLDL 17 0 - 40 mg/dL   LDL  Cholesterol 60 0 - 99 mg/dL    Comment:        Total Cholesterol/HDL:CHD Risk Coronary Heart Disease Risk Table                     Men   Women  1/2 Average Risk   3.4   3.3  Average Risk       5.0   4.4  2 X Average Risk   9.6   7.1  3 X Average Risk  23.4   11.0        Use the calculated Patient Ratio above and the CHD Risk Table to determine the patient's CHD Risk.        ATP III CLASSIFICATION (LDL):  <100     mg/dL   Optimal  100-129  mg/dL   Near or Above                    Optimal  130-159  mg/dL   Borderline  160-189  mg/dL   High  >190     mg/dL   Very High Performed at [place] [address] [place], [place] [address]   I-stat chem 8, ED (not at [facility] or [facility])     Status: Abnormal   Collection Time: 08/13/18  3:41 PM  Result Value Ref Range   Sodium 133 (L) 135 - 145 mmol/L   Potassium 6.3 (HH) 3.5 - 5.1 mmol/L   Chloride 107 98 - 111 mmol/L   BUN 64 (H) 8 - 23 mg/dL   Creatinine, Ser 10.60 (H) 0.61 - 1.24 mg/dL   Glucose, Bld 123 (H) 70 - 99 mg/dL   Calcium, Ion 1.17 1.15 - 1.40 mmol/L   TCO2 18 (L) 22 - 32 mmol/L   Hemoglobin 10.9 (L) 13.0 - 17.0 g/dL   HCT 32.0 (L) 39.0 - 52.0 %  Comment NOTIFIED PHYSICIAN    Dg Chest Port 1 View  Result Date: 08/13/2018 CLINICAL DATA:  85 year old with acute chest pain. EXAM: PORTABLE CHEST 1 VIEW COMPARISON:  05/16/2013. FINDINGS: Cardiac silhouette upper normal in size for technique, unchanged. Suboptimal inspiration. Calcified granuloma in the LEFT mid lung, unchanged. Lungs otherwise clear. Pulmonary vascularity normal. Bronchovascular markings normal. No visible pleural effusions. IMPRESSION: Suboptimal inspiration. No acute cardiopulmonary disease. Electronically Signed   By: Evangeline Dakin M.D.   On: 08/13/2018 16:49    Pending Labs Unresulted Labs (From admission, onward)    Start     Ordered   08/14/18 0500  CBC  Daily,   R     08/13/18 1610   08/13/18 1275  Basic metabolic panel  Once,    R     08/13/18 1711          Vitals/Pain Today's Vitals   08/13/18 1700 08/13/18 1715 08/13/18 1730 08/13/18 1741  BP: (!) 155/78 (!) 145/75 137/76   Pulse: 88 90 89   Resp: _0 Temp:      TempSrc:      SpO2: 97% 91% 99%   Weight:      Height:      PainSc:    2     Isolation Precautions No active isolations  Medications Medications  lisinopril (ZESTRIL) tablet 2.5 mg (has no administration in time range)  Cyanocobalamin KIT 1,000 mcg (has no administration in time range)  acetaminophen (TYLENOL) tablet 650 mg (has no administration in time range)    Or  acetaminophen (TYLENOL) suppository 650 mg (has no administration in time range)  haloperidol (HALDOL) tablet 0.5 mg (has no administration in time range)    Or  haloperidol (HALDOL) 2 MG/ML solution 0.5 mg (has no administration in time range)    Or  haloperidol lactate (HALDOL) injection 0.5 mg (has no administration in time range)  ondansetron (ZOFRAN-ODT) disintegrating tablet 4 mg (has no administration in time range)    Or  ondansetron (ZOFRAN) injection 4 mg (has no administration in time range)  glycopyrrolate (ROBINUL) tablet 1 mg (has no administration in time range)    Or  glycopyrrolate (ROBINUL) injection 0.2 mg (has no administration in time range)    Or  glycopyrrolate (ROBINUL) injection 0.2 mg (has no administration in time range)  antiseptic oral rinse (BIOTENE) solution 15 mL (has no administration in time range)  polyvinyl alcohol (LIQUIFILM TEARS) 1.4 % ophthalmic solution 1 drop (has no administration in time range)  sodium chloride flush (NS) 0.9 % injection 3 mL (has no administration in time range)  sodium chloride flush (NS) 0.9 % injection 3 mL (has no administration in time range)  0.9 %  sodium chloride infusion (has no administration in time range)  oxyCODONE (Oxy IR/ROXICODONE) immediate release tablet 5 mg (has no administration in time range)  temazepam (RESTORIL) capsule 7.5  mg (has no administration in time range)  LORazepam (ATIVAN) tablet 1 mg (has no administration in time range)    Or  LORazepam (ATIVAN) 2 MG/ML concentrated solution 1 mg (has no administration in time range)    Or  LORazepam (ATIVAN) injection 1 mg (has no administration in time range)  diphenhydrAMINE (BENADRYL) injection 12.5 mg (has no administration in time range)  bisacodyl (DULCOLAX) suppository 10 mg (has no administration in time range)  oxybutynin (DITROPAN) tablet 2.5 mg (has no administration in time range)  loperamide (IMODIUM) capsule 2 mg (has no administration in time  range)  pantoprazole (PROTONIX) EC tablet 40 mg (has no administration in time range)  alum & mag hydroxide-simeth (MAALOX/MYLANTA) 200-200-20 MG/5ML suspension 30 mL (has no administration in time range)  chlorproMAZINE (THORAZINE) 12.5 mg in sodium chloride 0.9 % 25 mL IVPB (has no administration in time range)  LORazepam (ATIVAN) injection 1 mg (has no administration in time range)  aspirin chewable tablet 324 mg (324 mg Oral Given 08/13/18 1535)  sodium zirconium cyclosilicate (LOKELMA) packet 10 g (10 g Oral Given 08/13/18 1713)  sodium bicarbonate injection 50 mEq (50 mEq Intravenous Given 08/13/18 1613)  dextrose 50 % solution 50 mL (50 mLs Intravenous Given 08/13/18 1626)  insulin aspart (novoLOG) injection 2 Units (2 Units Subcutaneous Given 08/13/18 1626)  calcium gluconate 1 g in sodium chloride 0.9 % 100 mL IVPB (0 g Intravenous Stopped 08/13/18 1722)    Mobility walks Low fall risk   Focused Assessments   R Recommendations: See Admitting Provider Note  Report given to:   Additional Notes:

## 2018-08-13 NOTE — ED Notes (Signed)
Pharmacy called by Johnette Abraham RN for heparin dose. Pharmacy to tube to unit stat.

## 2018-08-13 NOTE — H&P (Signed)
History and Physical    Caleb Hayes OFH:[id number] DOB: [birth year] DOA: 08/13/2018  PCP: Patient, No Pcp Per  Patient coming from: Home  I have personally briefly reviewed patient's old medical records in [place]  Chief Complaint: STEMI  HPI: Caleb Hayes is a 85 y.o. male with medical history significant of chronic kidney disease stage V who was lost to follow-up from nephrology in 2017.  He has a history of benign prostatic hypertrophy with chronic indwelling Foley catheter, gout, hypertension who presented to the emergency department with intermittent chest pain and shortness of breath.  EKG found a STEMI cardiology was consulted.  He was found to have a creatinine of 9.1 and a potassium of 6.3.  Previous notes with Dr. Mercy Moore show that the patient was adamant that he would never receive hemodialysis.  He has had several family members who have been on dialysis and is aware of the difficulties and does not want to go through it.  Today he specifically told me and Dr. Joelyn Oms that he does not want to have dialysis again.  Cardiac catheterization was considered but given his renal failure and the above other issues medical management has been decided upon.  Today Dr. Joelyn Oms and I both reviewed patient's renal function with him as well as his cardiac situation and he realizes that his renal failure could be fatal.  Remains very clear that he does not want to pursue hemodialysis.  Dr. Joelyn Oms discussed hospice with the patient and I did as well.  He is interested in having high quality days.  States that he hopes he can live a long time but knows that probably will not happen. Patient was referred to me by Dr. and for admission to the hospital as plans are for palliative and hospice comfort type care and cardiac intervention has been unhelpful patient refuses to consider dialysis.  Review of Systems: As per HPI otherwise all other systems reviewed and  negative.   Past Medical  History:  Diagnosis Date  . Gout   . Hypertension     History reviewed. No pertinent surgical history.  Social History   Social History Narrative   Fun/Hobby: Work on cars, fishing      reports that he has quit smoking. He has never used smokeless tobacco. He reports that he does not drink alcohol or use drugs.  No Known Allergies  Family History  Problem Relation Age of Onset  . Cervical cancer Mother   . Diabetes Father   . Vision loss Paternal Grandfather      Prior to Admission medications   Medication Sig Start Date End Date Taking? Authorizing Provider  Cyanocobalamin (B-12 COMPLIANCE INJECTION) 1000 MCG/ML KIT Inject 1,000 mcg as directed every 30 (thirty) days.   Yes [provider]  lisinopril (ZESTRIL) 2.5 MG tablet Take 1 tablet (2.5 mg total) by mouth daily. 03/18/18  Yes Magrinat, Virgie Dad, MD    Physical Exam:  Constitutional: NAD, calm, comfortable; well without accessory muscle use Vitals:   08/13/18 1745 08/13/18 1800 08/13/18 1815 08/13/18 1848  BP: (!) 155/86 (!) 146/86 (!) 141/79 (!) 156/79  Pulse: 88 86 88 85  Resp: 20 20 19 18   Temp:    99.3 F (37.4 C)  TempSrc:    Oral  SpO2: 98% 99% 99% 99%  Weight:      Height:       Eyes: PERRL, lids and conjunctivae normal ENMT: Mucous membranes are moist. Posterior pharynx clear of  any exudate or lesions.Normal dentition.  Neck: normal, supple, no masses, no thyromegaly Respiratory: clear to auscultation bilaterally, no wheezing, no crackles. Normal respiratory effort. No accessory muscle use.  Cardiovascular: Regular rate and rhythm, no murmurs / rubs / gallops. No extremity edema. 2+ pedal pulses. No carotid bruits.  Abdomen: no tenderness, no masses palpated. No hepatosplenomegaly. Bowel sounds positive.  Musculoskeletal: no clubbing / cyanosis. No joint deformity upper and lower extremities. Good ROM, no contractures. Normal muscle tone.  Skin: no rashes, lesions, ulcers. No induration  Neurologic: CN 2-12 grossly intact. Sensation intact, DTR normal. Strength 5-/5 in all 4.  Psychiatric: Normal judgment and insight. Alert and oriented x 3. Normal mood.    Labs on Admission: I have personally reviewed following labs and imaging studies  CBC: Recent Labs  Lab 08/13/18 1533 08/13/18 1541  WBC 11.1*  --   NEUTROABS 8.5*  --   HGB 10.3* 10.9*  HCT 30.1* 32.0*  MCV 88.3  --   PLT 291  --    Basic Metabolic Panel: Recent Labs  Lab 08/13/18 1533 08/13/18 1541  NA 132* 133*  K 6.8* 6.3*  CL 106 107  CO2 17*  --   GLUCOSE 124* 123*  BUN 69* 64*  CREATININE 9.10* 10.60*  CALCIUM 8.7*  --    GFR: Estimated Creatinine Clearance: 4.5 mL/min (A) (by C-G formula based on SCr of 10.6 mg/dL (H)). Liver Function Tests: Recent Labs  Lab 08/13/18 1533  AST 37  ALT 19  ALKPHOS 104  BILITOT 0.8  PROT 7.9  ALBUMIN 3.4*   Coagulation Profile: Recent Labs  Lab 08/13/18 1533  INR 1.1   Cardiac Enzymes: Recent Labs  Lab 08/13/18 1533  TROPONINI 23.26*   Lipid Profile: Recent Labs    08/13/18 1533  CHOL 120  HDL 43  LDLCALC 60  TRIG 83  CHOLHDL 2.8   Urine analysis:    Component Value Date/Time   COLORURINE RED (A) 11/11/2015 1310   APPEARANCEUR TURBID (A) 11/11/2015 1310   LABSPEC 1.028 11/11/2015 1310   PHURINE 7.5 11/11/2015 1310   GLUCOSEU 100 (A) 11/11/2015 1310   HGBUR LARGE (A) 11/11/2015 1310   BILIRUBINUR NEGATIVE 11/11/2015 1310   KETONESUR NEGATIVE 11/11/2015 1310   PROTEINUR >300 (A) 11/11/2015 1310   UROBILINOGEN 0.2 08/05/2014 1446   NITRITE NEGATIVE 11/11/2015 1310   LEUKOCYTESUR MODERATE (A) 11/11/2015 1310    Radiological Exams on Admission: Dg Chest Port 1 View  Result Date: 08/13/2018 CLINICAL DATA:  85 year old with acute chest pain. EXAM: PORTABLE CHEST 1 VIEW COMPARISON:  05/16/2013. FINDINGS: Cardiac silhouette upper normal in size for technique, unchanged. Suboptimal inspiration. Calcified granuloma in the LEFT mid  lung, unchanged. Lungs otherwise clear. Pulmonary vascularity normal. Bronchovascular markings normal. No visible pleural effusions. IMPRESSION: Suboptimal inspiration. No acute cardiopulmonary disease. Electronically Signed   By: Evangeline Dakin M.D.   On: 08/13/2018 16:49    EKG: Independently reviewed.  Inferior and anterior lateral ST segment elevation which is new when compared to prior  Assessment/Plan Principal Problem:   STEMI (ST elevation myocardial infarction) ([place]) Active Problems:   End stage chronic kidney disease (HCC)   Hyperkalemia   Goals of care, counseling/discussion   End of life care    1.  ST elevation myocardial infarction: Patient is not a candidate for cardiac catheterization and has refused hemodialysis.  We will medically manage his pain and discomfort with IV morphine, anxiety lytics, and oxygen.  I have discontinued the heparin drip as  this may be more burden than benefit on the patient as he will have to have blood work drawn.  He currently is comfortable and without chest pain.  2.  End-stage chronic kidney disease: Patient does not desire dialysis.  He understands that chronic kidney disease is fatal.  I have consulted palliative care and plan to ask hospice to get involved as well.  3.  Hyperkalemia: We will attempt to lower the potassium levels in order for patient to have time for his daughter to get here.  Given treatment by Dr. Loleta Books in the emergency department.  Although usually we do not do blood work on patients who are palliative I will order a BMP and a CBC for tomorrow morning to determine if treatment has been effective.  4.  Goals of care counseling discussion: Patient very clear that he does not want dialysis.  He does not want to be resuscitated.  Active care has been consulted.  I expect him to discharge on hospice or to pass here.  5.  End of life care: Aggressive efforts at comfort are being made for the patient.  The see orders.   DVT  prophylaxis: End-of-life Code Status: DO NOT RESUSCITATE Family Communication: I personally called and spoke with Kirstie Mirza at [phone].  I explained to her the nature and situation of her father's illness.  She understands that he is nearing end-of-life.  She wishes to come to the hospital I encouraged her to attempt to do so although I do not know if she will be allowed in.  She is his medical power of attorney however currently the patient retains capacity and is still able to make decisions for himself.  I explained to her that we plan to honor his choices. Disposition Plan: Home with hospice or to hospice facility Consults called: Palliative care, Dr. Joelyn Oms from nephrology, Dr. Saunders Revel from cardiology. Admission status: Inpatient   Lady Deutscher MD FACP [facility] Pager [phone]  How to contact the [facility] Attending or Consulting provider [place] or covering provider during after hours [place], for this patient?  1. Check the care team in [facility] and look for a) attending/consulting [facility] provider listed and b) the [facility] team listed 2. Log into [email or web address] and use [place]'s universal password to access. If you do not have the password, please contact the hospital operator. 3. Locate the [facility] provider you are looking for under [facility] and page to a number that you can be directly reached. 4. If you still have difficulty reaching the provider, please page the [facility] (Director on Call) for the Hospitalists listed on amion for assistance.  If 7PM-7AM, please contact night-coverage [email or web address] Password [facility]  08/13/2018, 7:13 PM

## 2018-08-13 NOTE — ED Notes (Signed)
Pharmacy called for patient STAT Heparin order. [facility] present at bedside. If Heparin is not in the unit by the time patient is ready to leave, patient will need to receive heparin from [facility] cath lab

## 2018-08-13 NOTE — Consult Note (Signed)
Cardiology Consultation:   Patient ID: Caleb Hayes MRN: [id number]; DOB: 04/09/1929  Admit date: 08/13/2018 Date of Consult: 08/13/2018  Primary Care Provider: Patient, No Pcp Per Primary Cardiologist: New Primary Electrophysiologist:  None    Patient Profile:   Caleb Hayes is a 85 y.o. male with a hx of chronic kidney disease stage V not on hemodialysis, pernicious anemia, hypertension, and gout who is being seen today for the evaluation of chest pain and abnormal EKG at the request of Dr. Darl Hayes.  History of Present Illness:   Caleb Hayes reports 3 days of intermittent shortness of breath and substernal chest pain without radiation.  He describes it as a soreness.  He has never felt this pain before.  He denies palpitations, lightheadedness, and edema.  He has a chronic indwelling Foley catheter and continues to make urine with this.  He has seen a nephrologist in the past and has adamantly refused hemodialysis.  He was brought to the [facility] ED by his nephew earlier today, where he was found to have anterolateral and inferior ST segment elevation, prompting transfer to [facility].  Labs at [facility] were also notable for a creatinine of 9.1, potassium 6.8, troponin XX 4, and hemoglobin 10.3.  Past Medical History:  Diagnosis Date  . Gout   . Hypertension     History reviewed. No pertinent surgical history.   Home Medications:  Prior to Admission medications   Medication Sig Start Date Caleb Hayes Date Taking? Authorizing Provider  lisinopril (ZESTRIL) 2.5 MG tablet Take 1 tablet (2.5 mg total) by mouth daily. 03/18/18   Magrinat, Virgie Dad, MD    Inpatient Medications: Scheduled Meds: . sodium zirconium cyclosilicate  10 g Oral Once   Continuous Infusions: . sodium chloride 20 mL/hr at 08/13/18 1621  . calcium gluconate 1 GM IVPB 1 g (08/13/18 1622)  . heparin 850 Units/hr (08/13/18 1606)   PRN Meds:   Allergies:   No Known Allergies  Social History:   Social  History   Tobacco Use  . Smoking status: Former Research scientist (life sciences)  . Smokeless tobacco: Never Used  Substance Use Topics  . Alcohol use: No  . Drug use: No    Family History:  Family History  Problem Relation Age of Onset  . Cervical cancer Mother   . Diabetes Father   . Vision loss Paternal Grandfather      ROS:  Please see the history of present illness.  All other ROS reviewed and negative.     Physical Exam/Data:   Vitals:   08/13/18 1558 08/13/18 1600 08/13/18 1602 08/13/18 1615  BP:  (!) 159/82  (!) 164/86  Pulse: 94 94  92  Resp: (!) 24 18  (!) 24  Temp:      TempSrc:      SpO2: 100% 100%  100%  Weight:   70.3 kg   Height:   5\' 6"  (1.676 m)    No intake or output data in the 24 hours ending 08/13/18 1628 Last 3 Weights 08/13/2018 03/18/2018 07/08/2017  Weight (lbs) 155 lb 160 lb 1.6 oz 162 lb 14.4 oz  Weight (kg) 70.308 kg 72.621 kg 73.891 kg     Body mass index is 25.02 kg/m.  General: Elderly man, lying comfortably in the emergency department. HEENT: normal Lymph: no adenopathy Neck: no JVD Endocrine:  No thryomegaly Vascular: No carotid bruits; FA pulses 2+ bilaterally without bruits  Cardiac: Regular rate and rhythm with 1/6 systolic murmur.  No rubs  or gallops. Lungs: Mildly diminished breath sounds throughout.  No wheezes or crackles.   Abd: soft, nontender, no hepatomegaly  Ext: no edema Musculoskeletal:  No deformities, BUE and BLE strength normal and equal Skin: warm and dry  Neuro:  CNs 2-12 intact, no focal abnormalities noted Psych:  Normal affect   EKG:  The EKG was personally reviewed and demonstrates: Epicardia with inferior and anterolateral ST segment elevation.  Relevant CV Studies: None.  Laboratory Data:  Chemistry Recent Labs  Lab 08/13/18 1533 08/13/18 1541  NA 132* 133*  K 6.8* 6.3*  CL 106 107  CO2 17*  --   GLUCOSE 124* 123*  BUN 69* 64*  CREATININE 9.10* 10.60*  CALCIUM 8.7*  --   GFRNONAA 5*  --   GFRAA 5*  --   ANIONGAP 9   --     Recent Labs  Lab 08/13/18 1533  PROT 7.9  ALBUMIN 3.4*  AST 37  ALT 19  ALKPHOS 104  BILITOT 0.8   Hematology Recent Labs  Lab 08/13/18 1533 08/13/18 1541  WBC 11.1*  --   RBC 3.41*  --   HGB 10.3* 10.9*  HCT 30.1* 32.0*  MCV 88.3  --   MCH 30.2  --   MCHC 34.2  --   RDW 13.7  --   PLT 291  --    Cardiac Enzymes Recent Labs  Lab 08/13/18 1533  TROPONINI 23.26*   No results for input(s): TROPIPOC in the last 168 hours.  BNPNo results for input(s): BNP, PROBNP in the last 168 hours.  DDimer No results for input(s): DDIMER in the last 168 hours.  Radiology/Studies:  No results found.  Assessment and Plan:   STEMI: Patient presents with 3 days of intermittent chest pain and shortness of breath.  EKG shows significant anterolateral and inferior ST segment elevation (presumably from acute plaque rupture).  However, this is confounded by his advanced CKD (creatinine greater than 9) and hyperkalemia (potassium 6.8).  Caleb Hayes is adamant that he would never wish to undergo hemodialysis.  He also does not wish to undergo CPR or mechanical ventilation.  Given these wishes, I think the risk of cardiac catheterization outweighs the potential benefits.  We have agreed to medical management.  Admit to internal medicine service; we will continue to follow along.  Initiate IV heparin, to be continued for 48 hours.  Continue aspirin 81 mg daily.  If no evidence of bleeding, consider loading with clopidogrel 300 mg x 1 followed by 75 mg daily.  Obtain transthoracic echocardiogram.  Secondary prevention including high intensity statin therapy.  Consider addition of metoprolol tartrate 12.5 mg twice daily with up titration as heart rate and blood pressure allow.  Chronic kidney disease stage V: Uncertain if there has been an acute progression of his CKD, with prior creatinine he noted to be 6.5.  This has been complicated by significant hyperkalemia.  Avoid nephrotoxic  agents, including IV contrast.  Management of hyperkalemia per ED team.  Consultation with nephrology may be helpful, though as above, patient declines hemodialysis.  Hypertension: Blood pressure significantly elevated.  Consider addition of low-dose beta-blocker.  Nitroglycerin infusion may also be helpful in the short-term, particularly if Mr. Kawai has recurrent chest pain.  For questions or updates, please contact [place] Please consult [email or web address] for contact info under   Signed, Nelva Bush, MD  08/13/2018 4:28 PM

## 2018-08-14 ENCOUNTER — Encounter (HOSPITAL_COMMUNITY): Payer: Self-pay | Admitting: Primary Care

## 2018-08-14 ENCOUNTER — Telehealth: Payer: Self-pay | Admitting: *Deleted

## 2018-08-14 DIAGNOSIS — Z515 Encounter for palliative care: Secondary | ICD-10-CM

## 2018-08-14 DIAGNOSIS — Z7189 Other specified counseling: Secondary | ICD-10-CM

## 2018-08-14 DIAGNOSIS — E875 Hyperkalemia: Secondary | ICD-10-CM

## 2018-08-14 LAB — CBC
HCT: 26.9 % — ABNORMAL LOW (ref 39.0–52.0)
Hemoglobin: 9.3 g/dL — ABNORMAL LOW (ref 13.0–17.0)
MCH: 29.1 pg (ref 26.0–34.0)
MCHC: 34.6 g/dL (ref 30.0–36.0)
MCV: 84.1 fL (ref 80.0–100.0)
Platelets: 256 10*3/uL (ref 150–400)
RBC: 3.2 MIL/uL — ABNORMAL LOW (ref 4.22–5.81)
RDW: 13.2 % (ref 11.5–15.5)
WBC: 10.4 10*3/uL (ref 4.0–10.5)
nRBC: 0 % (ref 0.0–0.2)

## 2018-08-14 NOTE — Progress Notes (Signed)
   08/14/18 1030  Clinical Encounter Type  Visited With Patient  Visit Type Initial  Referral From Palliative care team  Consult/Referral To Chaplain  This chaplain responded to EOL spiritual care consult from PMT.  The chaplain read the Pt. chart and checked in with the Pt. RN-Luisa. The Pt. was awake and alert at the time of the visit.  The chaplain heard the Pt. willingness to be alone with support from family phone calls.  The chaplain's conversation with the Pt. quickly turned into joyful story telling about the Pt. history of gardening and a his presence in nature.  The Pt. spirituality  honors each new day as a gift from God. The Pt. accepted the chaplain's invitation for prayer and a return visit.  The chaplain is available for F/U spiritual care as needed.

## 2018-08-14 NOTE — Plan of Care (Signed)
  Problem: Clinical Measurements: Goal: Ability to maintain clinical measurements within normal limits will improve Outcome: Progressing   Problem: Education: Goal: Knowledge of General Education information will improve Description: Including pain rating scale, medication(s)/side effects and non-pharmacologic comfort measures Outcome: Progressing   

## 2018-08-14 NOTE — Social Work (Signed)
CSW acknowledging consult for comfort care. Await PMT meeting with family. Will follow for disposition should hospice services or residential hospice become appropriate.   Alexander [place], [place] [facility] [phone]

## 2018-08-14 NOTE — Progress Notes (Signed)
Progress Note  Patient Name: Caleb Hayes Date of Encounter: 08/14/2018  Primary Cardiologist:   No primary care provider on file.   Subjective   He denies any chest pain.  No SOB.    Inpatient Medications    Scheduled Meds: . pantoprazole  40 mg Oral Daily  . sodium chloride flush  3 mL Intravenous Q12H   Continuous Infusions: . sodium chloride    . chlorproMAZINE (THORAZINE) IV     PRN Meds: sodium chloride, acetaminophen **OR** acetaminophen, alum & mag hydroxide-simeth, antiseptic oral rinse, bisacodyl, chlorproMAZINE (THORAZINE) IV, diphenhydrAMINE, glycopyrrolate **OR** glycopyrrolate **OR** glycopyrrolate, haloperidol **OR** haloperidol **OR** haloperidol lactate, loperamide, LORazepam **OR** LORazepam **OR** LORazepam, LORazepam, morphine injection, ondansetron **OR** ondansetron (ZOFRAN) IV, oxybutynin, oxyCODONE, polyvinyl alcohol, sodium chloride flush, temazepam   Vital Signs    Vitals:   08/13/18 1800 08/13/18 1815 08/13/18 1848 08/14/18 0450  BP: (!) 146/86 (!) 141/79 (!) 156/79 138/62  Pulse: 86 88 85 87  Resp: 20 19 18 18   Temp:   99.3 F (37.4 C) 99.1 F (37.3 C)  TempSrc:   Oral Oral  SpO2: 99% 99% 99% 97%  Weight:      Height:        Intake/Output Summary (Last 24 hours) at 08/14/2018 1216 Last data filed at 08/14/2018 1110 Gross per 24 hour  Intake 424.48 ml  Output 750 ml  Net -325.52 ml   Filed Weights   08/13/18 1602  Weight: 70.3 kg    Telemetry    NA - Personally Reviewed  ECG    NA - Personally Reviewed  Physical Exam   GEN: No acute distress.   Neck: No  JVD Cardiac: RRR, no murmurs, positive rub, or gallops.  Respiratory: Clear  to auscultation bilaterally. GI: Soft, nontender, non-distended  MS: No  edema; No deformity. Neuro:  Nonfocal  Psych: Normal affect   Labs    Chemistry Recent Labs  Lab 08/13/18 1533 08/13/18 1541 08/13/18 1945  NA 132* 133* 132*  K 6.8* 6.3* 5.8*  CL 106 107 106  CO2 17*  --   16*  GLUCOSE 124* 123* 95  BUN 69* 64* 65*  CREATININE 9.10* 10.60* 9.59*  CALCIUM 8.7*  --  8.7*  PROT 7.9  --   --   ALBUMIN 3.4*  --   --   AST 37  --   --   ALT 19  --   --   ALKPHOS 104  --   --   BILITOT 0.8  --   --   GFRNONAA 5*  --  4*  GFRAA 5*  --  5*  ANIONGAP 9  --  10     Hematology Recent Labs  Lab 08/13/18 1533 08/13/18 1541 08/14/18 0438  WBC 11.1*  --  10.4  RBC 3.41*  --  3.20*  HGB 10.3* 10.9* 9.3*  HCT 30.1* 32.0* 26.9*  MCV 88.3  --  84.1  MCH 30.2  --  29.1  MCHC 34.2  --  34.6  RDW 13.7  --  13.2  PLT 291  --  256    Cardiac Enzymes Recent Labs  Lab 08/13/18 1533  TROPONINI 23.26*   No results for input(s): TROPIPOC in the last 168 hours.   BNPNo results for input(s): BNP, PROBNP in the last 168 hours.   DDimer No results for input(s): DDIMER in the last 168 hours.   Radiology    Dg Chest Port 1 View  Result Date: 08/13/2018  CLINICAL DATA:  85 year old with acute chest pain. EXAM: PORTABLE CHEST 1 VIEW COMPARISON:  05/16/2013. FINDINGS: Cardiac silhouette upper normal in size for technique, unchanged. Suboptimal inspiration. Calcified granuloma in the LEFT mid lung, unchanged. Lungs otherwise clear. Pulmonary vascularity normal. Bronchovascular markings normal. No visible pleural effusions. IMPRESSION: Suboptimal inspiration. No acute cardiopulmonary disease. Electronically Signed   By: Evangeline Dakin M.D.   On: 08/13/2018 16:49    Cardiac Studies   NA  Patient Profile     85 y.o. male with a hx of chronic kidney disease stage V not on hemodialysis, pernicious anemia, hypertension, and gout who is being seen for the evaluation of chest pain and abnormal EKG at the request of Dr. Darl Householder.  Assessment & Plan    STEMI:    Medical management.   Continue IV heparin and discontinue in the AM.  He does have a rub but no pain and is not hypotensive or SOB and has absolutely no symptoms.  I would suggest trying a low dose of beta blocker and  continuing the ASA.  I do think that it is reasonable to check an echo which might help guide medical therapy.  I will order this.     HTN:  The blood pressure is at target. No change in medications is indicated. We will continue with therapeutic lifestyle changes (TLC).  CKD V:  No dialysis.      For questions or updates, please contact [place] Please consult [email or web address] for contact info under Cardiology/STEMI.   Signed, Minus Breeding, MD  08/14/2018, 12:16 PM

## 2018-08-14 NOTE — Progress Notes (Signed)
PROGRESS NOTE    Caleb Hayes  GQQ:[id number] DOB: Oct 29, 1929 DOA: 08/13/2018 PCP: Patient, No Pcp Per   Brief Narrative: Caleb Hayes is a 85 y.o. male with medical history significant of chronic kidney disease stage V who was lost to follow-up from nephrology in 2017.  He has a history of benign prostatic hypertrophy with chronic indwelling Foley catheter, gout, hypertension who presented to the emergency department with intermittent chest pain and shortness of breath.  EKG found a STEMI cardiology was consulted. Cardiac catheterization was considered but given his renal failure and the above other issues medical management has been decided upon.  Remains very clear that he does not want to pursue hemodialysis.  Dr. Joelyn Oms discussed hospice with the patient and palliative care consulted.    Assessment & Plan:   Principal Problem:   STEMI (ST elevation myocardial infarction) ([place]) Active Problems:   End stage chronic kidney disease (HCC)   Hyperkalemia   Goals of care, counseling/discussion   End of life care   STEMI: Pt is not a candidate for cardiac cath as it outweighs the benefits, when he is adamant about refusing HD/ CPR and mechanical ventilation.  Medical management for now.  Palliative care consulted for goals of care and transition to hospice with goals for quality of life.    ESRD: HD not initiated yet.    Hyperkalemia Improvement in potassium from 6.8 to 5.8.   GERD On protonix.      DVT prophylaxis: scd's Code Status: full code.  Family Communication: none at bedside. Discussed the plan with the patient's daughter Benita [place] patient's POA.  Disposition Plan: pending palliative care consult.    Consultants:   Cardiology  Nephrology.   Palliative care.    Procedures: none.    Antimicrobials: none.    Subjective: No chest pain or sob.  No nausea, vomiting or abdominal pain.  No fever or chills.   Objective: Vitals:   08/13/18 1800 08/13/18 1815 08/13/18 1848 08/14/18 0450  BP: (!) 146/86 (!) 141/79 (!) 156/79 138/62  Pulse: 86 88 85 87  Resp: 20 19 18 18   Temp:   99.3 F (37.4 C) 99.1 F (37.3 C)  TempSrc:   Oral Oral  SpO2: 99% 99% 99% 97%  Weight:      Height:        Intake/Output Summary (Last 24 hours) at 08/14/2018 0859 Last data filed at 08/14/2018 0524 Gross per 24 hour  Intake 184.48 ml  Output 550 ml  Net -365.52 ml   Filed Weights   08/13/18 1602  Weight: 70.3 kg    Examination:  General exam: Appears calm and comfortable , not in distress.  Respiratory system: Clear to auscultation. Respiratory effort normal. Cardiovascular system: S1 & S2 heard, RRR.  Gastrointestinal system: Abdomen is nondistended, soft and nontender. No organomegaly or masses felt. Normal bowel sounds heard. Central nervous system: Alert and oriented. Non focal.  Extremities: no pedal edema.  Skin: No rashes, lesions or ulcers Psychiatry: Mood & affect appropriate.     Data Reviewed: I have personally reviewed following labs and imaging studies  CBC: Recent Labs  Lab 08/13/18 1533 08/13/18 1541 08/14/18 0438  WBC 11.1*  --  10.4  NEUTROABS 8.5*  --   --   HGB 10.3* 10.9* 9.3*  HCT 30.1* 32.0* 26.9*  MCV 88.3  --  84.1  PLT 291  --  671   Basic Metabolic Panel: Recent Labs  Lab 08/13/18 1533 08/13/18  1541 08/13/18 1945  NA 132* 133* 132*  K 6.8* 6.3* 5.8*  CL 106 107 106  CO2 17*  --  16*  GLUCOSE 124* 123* 95  BUN 69* 64* 65*  CREATININE 9.10* 10.60* 9.59*  CALCIUM 8.7*  --  8.7*   GFR: Estimated Creatinine Clearance: 5 mL/min (A) (by C-G formula based on SCr of 9.59 mg/dL (H)). Liver Function Tests: Recent Labs  Lab 08/13/18 1533  AST 37  ALT 19  ALKPHOS 104  BILITOT 0.8  PROT 7.9  ALBUMIN 3.4*   No results for input(s): LIPASE, AMYLASE in the last 168 hours. No results for input(s): AMMONIA in the last 168 hours. Coagulation Profile: Recent Labs  Lab 08/13/18 1533   INR 1.1   Cardiac Enzymes: Recent Labs  Lab 08/13/18 1533  TROPONINI 23.26*   BNP (last 3 results) No results for input(s): PROBNP in the last 8760 hours. HbA1C: No results for input(s): HGBA1C in the last 72 hours. CBG: No results for input(s): GLUCAP in the last 168 hours. Lipid Profile: Recent Labs    08/13/18 1533  CHOL 120  HDL 43  LDLCALC 60  TRIG 83  CHOLHDL 2.8   Thyroid Function Tests: No results for input(s): TSH, T4TOTAL, FREET4, T3FREE, THYROIDAB in the last 72 hours. Anemia Panel: No results for input(s): VITAMINB12, FOLATE, FERRITIN, TIBC, IRON, RETICCTPCT in the last 72 hours. Sepsis Labs: No results for input(s): PROCALCITON, LATICACIDVEN in the last 168 hours.  Recent Results (from the past 240 hour(s))  SARS Coronavirus 2 (CEPHEID - Performed in [facility] hospital lab), Hosp Order     Status: None   Collection Time: 08/13/18  3:20 PM  Result Value Ref Range Status   SARS Coronavirus 2 NEGATIVE NEGATIVE Final    Comment: (NOTE) If result is NEGATIVE SARS-CoV-2 target nucleic acids are NOT DETECTED. The SARS-CoV-2 RNA is generally detectable in upper and lower  respiratory specimens during the acute phase of infection. The lowest  concentration of SARS-CoV-2 viral copies this assay can detect is 250  copies / mL. A negative result does not preclude SARS-CoV-2 infection  and should not be used as the sole basis for treatment or other  patient management decisions.  A negative result may occur with  improper specimen collection / handling, submission of specimen other  than nasopharyngeal swab, presence of viral mutation(s) within the  areas targeted by this assay, and inadequate number of viral copies  (<250 copies / mL). A negative result must be combined with clinical  observations, patient history, and epidemiological information. If result is POSITIVE SARS-CoV-2 target nucleic acids are DETECTED. The SARS-CoV-2 RNA is generally detectable in  upper and lower  respiratory specimens dur ing the acute phase of infection.  Positive  results are indicative of active infection with SARS-CoV-2.  Clinical  correlation with patient history and other diagnostic information is  necessary to determine patient infection status.  Positive results do  not rule out bacterial infection or co-infection with other viruses. If result is PRESUMPTIVE POSTIVE SARS-CoV-2 nucleic acids MAY BE PRESENT.   A presumptive positive result was obtained on the submitted specimen  and confirmed on repeat testing.  While 2019 novel coronavirus  (SARS-CoV-2) nucleic acids may be present in the submitted sample  additional confirmatory testing may be necessary for epidemiological  and / or clinical management purposes  to differentiate between  SARS-CoV-2 and other Sarbecovirus currently known to infect humans.  If clinically indicated additional testing with an alternate  test  methodology [phone]) is advised. The SARS-CoV-2 RNA is generally  detectable in upper and lower respiratory sp ecimens during the acute  phase of infection. The expected result is Negative. Fact Sheet for Patients:  StrictlyIdeas.no Fact Sheet for Healthcare Providers: BankingDealers.co.za This test is not yet approved or cleared by the [place] FDA and has been authorized for detection and/or diagnosis of SARS-CoV-2 by FDA under an Emergency Use Authorization (EUA).  This EUA will remain in effect (meaning this test can be used) for the duration of the COVID-19 declaration under Section 564(b)(1) of the Act, 21 U.S.C. section 360bbb-3(b)(1), unless the authorization is terminated or revoked sooner. Performed at [facility], [place] [address], [place], [place] [address]          Radiology Studies: Dg Chest Port 1 View  Result Date: 08/13/2018 CLINICAL DATA:  85 year old with acute chest pain. EXAM: PORTABLE  CHEST 1 VIEW COMPARISON:  05/16/2013. FINDINGS: Cardiac silhouette upper normal in size for technique, unchanged. Suboptimal inspiration. Calcified granuloma in the LEFT mid lung, unchanged. Lungs otherwise clear. Pulmonary vascularity normal. Bronchovascular markings normal. No visible pleural effusions. IMPRESSION: Suboptimal inspiration. No acute cardiopulmonary disease. Electronically Signed   By: Evangeline Dakin M.D.   On: 08/13/2018 16:49        Scheduled Meds: . pantoprazole  40 mg Oral Daily  . sodium chloride flush  3 mL Intravenous Q12H   Continuous Infusions: . sodium chloride    . chlorproMAZINE (THORAZINE) IV       LOS: 1 day    Time spent: 32 minutes.     Hosie Poisson, MD [facility] Pager [phone] If 7PM-7AM, please contact night-coverage [email or web address] Password [email or web address] 08/14/2018, 8:59 AM

## 2018-08-14 NOTE — Progress Notes (Addendum)
Hydrologist [facility])  Hospital Liaison: RN note.  Notified by Moshe Salisbury of patient/family request for [facility] services at home after discharge. Chart and patient information  reviewed by [facility] physician. Hospice eligibility confirmed.     Writer spoke with daughter to initiate education related to hospice philosophy, services and team approach to care. verbalized understanding of information given. Per discussion, plan is for discharge to home by private vehicle.     Please send signed and completed DNR form home with patient/family. Patient will need prescriptions for discharge comfort medications.     DME needs have been discussed, patient currently has the following equipment in the home: none .  Patient/family requests the following DME for delivery to the home: oxygen, portable tank to room for ride home, walker and cane. [place] equipment manager has been notified and will contact [employer] to arrange delivery to the home. Home address is not correct in Epic. [address], [place]. Doroteo Bradford is the family member to contact to arrange time of delivery.     [facility] aware of the above. Please notify [facility] when patient is ready to leave the unit at discharge. (Call [phone] or [phone] after 5pm.) [facility] information and contact numbers given to daughter.      Please call with any hospice related questions.     Thank you for this referral.     Farrel Gordon, RN, CCM  [place] (listed on AMION under Hospice and [place] [facility] [place])  [phone]

## 2018-08-14 NOTE — Telephone Encounter (Signed)
This RN received call from [place] with [facility] ([facility] ) per pt is being discharged with request for hospice services " but the pt said " Dr Jana Hakim is the only doctor I want to take care of me " .  Hospice needs an attending for establishing care for weekend visit.  Note in Epic relating to discharge with hospice services for home care.  This RN informed Delsa Sale - Dr Jannifer Rodney would be attending at this time- and if needed in future for continuity of care related to diagnosis and need- attending hospice MD can be changed.

## 2018-08-14 NOTE — Consult Note (Signed)
Consultation Note Date: 08/14/2018   Patient Name: Caleb Hayes  DOB: 23-Nov-1929  MRN: [id number]  Age / Sex: 85 y.o., male  PCP: Patient, No Pcp Per Referring Physician: Hosie Poisson, MD  Reason for Consultation: Establishing goals of care and Psychosocial/spiritual support  HPI/Patient Profile: 85 y.o. male  with past medical history of hypertension, gout, end-stage renal disease declining hemodialysis admitted on 08/13/2018 with STEMI.  Palliative medicine consulted for hospice referral.  Clinical Assessment and Goals of Care: I have reviewed medical records including EPIC notes, labs and imaging, received report from bedside nursing staff, assessed the patient and then met at the bedside with the patient to discuss diagnosis prognosis, GOC, EOL wishes, disposition and options.   Caleb Hayes is resting quietly in bed.  He greets me making and somewhat keeping eye contact.  He appears somewhat frail.  There is no family at bedside at this time dt visitor restrictions.  I introduced Palliative Medicine as specialized medical care for people living with serious illness. It focuses on providing relief from the symptoms and stress of a serious illness. The goal is to improve quality of life for both the patient and the family.    We discussed a brief life review of the patient.  He tells me that [place] and his family was raised together.  He shares that some of them still live on the farm.  He currently lives with his sister, Caleb Hayes.  Per nursing staff he had lived with his daughter (in [place]?)  Until recently when he moved in with Caleb Hayes.  Caleb Hayes tells me that 1 of Caleb Hayes's sons recently died in the home, he had breathing problems.  As far as functional and nutritional status, Caleb Hayes tells me that he was able to get up and get his own bath without difficulty this morning.  He  tells me that he is independent with ADLs, and Caleb Hayes drives for them.  He tells me that he tried walking with a cane, but he found himself leaning over too much so he stopped.  He tells me that he does not need any equipment at home.  Caleb Hayes tells me about working in his garden.   We discussed current illness and what it means in the larger context of her on-going co-morbidities.  Natural disease trajectory and expectations at EOL were discussed.  Caleb Hayes easily and frankly talks about his mortality.  He tells me that he has considered his own passing.  He also talks about his faith.  He tells me that he would like to allow a natural death (DNR).  He also laughs and says he has planned to be cremated, as cost savings.  I attempted to elicit values and goals of care important to the patient.  Caleb Hayes values his independence, it is important for him to be in his own home.  The difference between aggressive medical intervention and comfort care was considered in light of the patient's goals  of care.  Caleb Hayes tells me that he would return to the hospital when he gets sick again.  We talked about preferred place of death, Caleb Hayes shares that he does not consider preferred place of death, instead excepting that it will happen regardless of where he is.  Advanced directives, concepts specific to code status, artifical feeding and hydration, and rehospitalization were considered and discussed.  Caleb Hayes endorses to allow a natural death (DNR).  I ask if his daughters and sister would abide this wish, and he tells me that it is his decision.  I shared that it certainly is, but if he is unresponsive, they call the ambulance service, he may be put on life support if they do not agree with his choice to allow a natural death.  He seems convinced that they will respect his wishes, but he does not outright say that he has discussed it with him.  Hospice and Palliative Care services outpatient were  explained and offered.  We talked about the benefits of hospice and palliative care in detail.  Caleb Hayes clearly tells me that he does not need any services at home, he does not want home hospice.  He clearly does not qualify for residential hospice at this time.  We talked about the benefits of hospice in detail focusing on what is important to him, caring for him, support from the medical team at home.  Caleb Hayes continues to decline the need for any assistance, in particular hospice.  He is agreeable for PMT to call his Sister Caleb Hayes to verify that she does not need assistance in caring for him.   Conference with social work and case management related to patient needs and disposition.  Call to sister, Caleb Hayes, at [phone].  Brief voice mail message.   Call to daughter Caleb Hayes at [phone].  Tells me that she is HCPOA.   She shares that Medical team is not to discuss medical issues with anyone else, including half sister Caleb Hayes thinks has POA.  Caleb Hayes tells a story of loosing many sisters and family, that she took care of sister who had cervical cancer and had at home hospice. Caleb Hayes tells me that they will accept at home hospice.  She shares that she and her aunt Caleb Hayes need help in caring for Caleb Hayes.  She shares that he will accept at home hospice services. Preference is [facility], second choice is [facility] if [place] declines to travel that far.  We talk about equipment needs, the need to tells me that she thinks her father will likely need cane, walker.   Caleb Hayes tells me that she feels he may need oxygen.   HCPOA     NEXT OF KIN - Caleb Hayes tells me that his daughter Caleb Hayes and his sister Caleb Hayes are his surrogate decision makers.  He lives with Caleb Hayes.  He tells me that he has 2 other daughters, one in [place] and one in [place].    SUMMARY OF RECOMMENDATIONS   Continue to treat the treatable but no CPR, no intubation.  Declines at home hospice services.  Does not qualify for residential hospice at this point. Return home, no needs identified by patient.  Code Status/Advance Care Planning:  DNR -  Family agrees to DNR.   Symptom Management:   Per hospitalist, no additional needs at this time.  Palliative Prophylaxis:   Frequent Pain Assessment and Turn Reposition  Additional Recommendations (Limitations, Scope, Preferences):  Treat the treatable but no CPR, no intubation, declines hospice  Psycho-social/Spiritual:   Desire for further Chaplaincy support:yes  Additional Recommendations: Caregiving  Support/Resources and Education on Hospice  Prognosis:   Unable to determine, based on outcomes.  3 months or less would not be surprising.  Severely elevated troponin at 23, but currently asymptomatic.  ESRD with creatinine of 9, declines HD.  Discharge Planning: Home, declines in-home services, states no needs.      Primary Diagnoses: Present on Admission: . STEMI (ST elevation myocardial infarction) ([place]) . Hyperkalemia   I have reviewed the medical record, interviewed the patient and family, and examined the patient. The following aspects are pertinent.  Past Medical History:  Diagnosis Date  . Gout   . Hypertension    Social History   Socioeconomic History  . Marital status: Divorced    Spouse name: Not on file  . Number of children: 6  . Years of education: 54  . Highest education level: Not on file  Occupational History  . Occupation: Retired   Scientific laboratory technician  . Financial resource strain: Not on file  . Food insecurity:    Worry: Not on file    Inability: Not on file  . Transportation needs:    Medical: Not on file    Non-medical: Not on file  Tobacco Use  . Smoking status: Former Research scientist (life sciences)  . Smokeless tobacco: Never Used  Substance and Sexual Activity  . Alcohol use: No  . Drug use: No  . Sexual activity: Not on file  Lifestyle  . Physical activity:    Days per  week: Not on file    Minutes per session: Not on file  . Stress: Not on file  Relationships  . Social connections:    Talks on phone: Not on file    Gets together: Not on file    Attends religious service: Not on file    Active member of club or organization: Not on file    Attends meetings of clubs or organizations: Not on file    Relationship status: Not on file  Other Topics Concern  . Not on file  Social History Narrative   Fun/Hobby: Work on cars, fishing    Family History  Problem Relation Age of Onset  . Cervical cancer Mother   . Diabetes Father   . Vision loss Paternal Grandfather    Scheduled Meds: . pantoprazole  40 mg Oral Daily  . sodium chloride flush  3 mL Intravenous Q12H   Continuous Infusions: . sodium chloride    . chlorproMAZINE (THORAZINE) IV     PRN Meds:.sodium chloride, acetaminophen **OR** acetaminophen, alum & mag hydroxide-simeth, antiseptic oral rinse, bisacodyl, chlorproMAZINE (THORAZINE) IV, diphenhydrAMINE, glycopyrrolate **OR** glycopyrrolate **OR** glycopyrrolate, haloperidol **OR** haloperidol **OR** haloperidol lactate, loperamide, LORazepam **OR** LORazepam **OR** LORazepam, LORazepam, morphine injection, ondansetron **OR** ondansetron (ZOFRAN) IV, oxybutynin, oxyCODONE, polyvinyl alcohol, sodium chloride flush, temazepam Medications Prior to Admission:  Prior to Admission medications   Medication Sig Start Date End Date Taking? Authorizing Provider  Cyanocobalamin (B-12 COMPLIANCE INJECTION) 1000 MCG/ML KIT Inject 1,000 mcg as directed every 30 (thirty) days.   Yes [provider]  lisinopril (ZESTRIL) 2.5 MG tablet Take 1 tablet (2.5 mg total) by mouth daily. 03/18/18  Yes Magrinat, Virgie Dad, MD   No Known Allergies Review of Systems  Unable to perform ROS: Age    Physical Exam Vitals signs and nursing note reviewed.  Constitutional:  General: He is not in acute distress.    Appearance: He is not ill-appearing.      Comments: Makes and somewhat keeps eye contact,  Appears in good spirits but somewhat frail.   HENT:     Head: Normocephalic and atraumatic.  Cardiovascular:     Rate and Rhythm: Normal rate.  Pulmonary:     Effort: Pulmonary effort is normal. No respiratory distress.  Abdominal:     General: Abdomen is flat. There is no distension.  Skin:    General: Skin is warm and dry.  Neurological:     General: No focal deficit present.     Mental Status: He is alert and oriented to person, place, and time.  Psychiatric:     Comments: Calm and cooperative,. Not fearful      Vital Signs: BP 138/62 (BP Location: Right Arm)   Pulse 87   Temp 99.1 F (37.3 C) (Oral)   Resp 18   Ht 5' 6" (1.676 m)   Wt 70.3 kg   SpO2 97%   BMI 25.02 kg/m  Pain Scale: 0-10   Pain Score: 0-No pain   SpO2: SpO2: 97 % O2 Device:SpO2: 97 % O2 Flow Rate: .   IO: Intake/output summary:   Intake/Output Summary (Last 24 hours) at 08/14/2018 1321 Last data filed at 08/14/2018 1110 Gross per 24 hour  Intake 424.48 ml  Output 750 ml  Net -325.52 ml    LBM: Last BM Date: 08/13/18 Baseline Weight: Weight: 70.3 kg Most recent weight: Weight: 70.3 kg     Palliative Assessment/Data:   Flowsheet Rows     Most Recent Value  Intake Tab  Referral Department  Hospitalist  Unit at Time of Referral  Other (Comment)  Palliative Care Primary Diagnosis  Cardiac  Date Notified  08/13/18  Palliative Care Type  New Palliative care  Reason for referral  Advance Care Planning, Counsel Regarding Hospice  Date of Admission  08/13/18  Date first seen by Palliative Care  08/14/18  # of days Palliative referral response time  1 Day(s)  # of days IP prior to Palliative referral  0  Clinical Assessment  Palliative Performance Scale Score  40%  Pain Max last 24 hours  Not able to report  Pain Min Last 24 hours  Not able to report  Dyspnea Max Last 24 Hours  Not able to report  Dyspnea Min Last 24 hours  Not able to  report  Psychosocial & Spiritual Assessment  Palliative Care Outcomes      Time In: 1320 Time Out: 1440 Time Total: 80 minutes  Greater than 50%  of this time was spent counseling and coordinating care related to the above assessment and plan.  Signed by: Drue Novel, NP   Please contact Palliative Medicine Team phone at [phone] for questions and concerns.  For individual provider: See Shea Evans

## 2018-08-14 NOTE — Progress Notes (Signed)
Seems that patient is very clear about his wishes for no dialysis - potassium is better after stopping ACE ad giving lokelma.  Would not resume ACE-I.  Currently looking into options for home hospice  Renal has nothing more to add, will sign off.  Call with questions   Louis Meckel

## 2018-08-14 NOTE — TOC Initial Note (Signed)
Transition of Care [facility]) - Initial/Assessment Note    Patient Details  Name: Caleb Hayes MRN: [id number] Date of Birth: [birth year]  Transition of Care [facility]) CM/SW Contact:    Bethena Roys, RN Phone Number: 08/14/2018, 3:39 PM  Clinical Narrative: Pt presented for Stemi- hx of chronic kidney disease stage five. Patient is declining HD at this time. Palliative care following- spoke with patient and daughter regarding [place]. Daughter Freda Munro is agreeable to [facility]. PTA from home with daughter Anice Paganini Address [place] [phone]. Referral sent to [place] accepted referral and will speak with patient and family regarding DME. SOC to begin within 24-48 hours post transition home. Daughters to provide transportation home. No further needs at this time.             Expected Discharge Plan: Home w Hospice Care Barriers to Discharge: No Barriers Identified   Patient Goals and CMS Choice     Choice offered to / list presented to : [place] / Guardian  Expected Discharge Plan and Services Expected Discharge Plan: [place] In-house Referral: NA Discharge Planning Services: CM Consult Post Acute Care Choice: Hospice, Durable Medical Equipment Living arrangements for the past 2 months: Single Family Home                 DME Arranged: Oxygen, Walker rolling, [place] DME Agency: [facility] will order DME) Date DME Agency Contacted: 08/14/18 Time DME Agency Contacted: [place] Representative spoke with at DME Agency: Bevely Palmer with [facility] to place order for DME HH Arranged: RN [facility] Agency: [facility] [place] Date [place]: 08/14/18 Time [place]: [place] Representative spoke with at [place]: Bevely Palmer  Prior Living Arrangements/Services Living arrangements for the past 2 months: [place] Lives  with:: Adult Children Patient language and need for interpreter reviewed:: Yes Do you feel safe going back to the place where you live?: Yes      Need for Family Participation in Patient Care: Yes (Comment) Care giver support system in place?: Yes (comment) Current home services: (Independent) Criminal Activity/Legal Involvement Pertinent to Current Situation/Hospitalization: No - Comment as needed  Activities of Daily Living      Permission Sought/Granted Permission sought to share information with : Family Supports Permission granted to share information with : Yes, Verbal Permission Granted  Share Information with NAME: Kirstie Mirza daughter @ [phone]           Emotional Assessment Appearance:: Appears stated age Attitude/Demeanor/Rapport: Engaged Affect (typically observed): Accepting Orientation: : Oriented to Self, Oriented to Place, Oriented to  Time, Oriented to Situation Alcohol / Substance Use: Not Applicable Psych Involvement: No (comment)  Admission diagnosis:  ST elevation myocardial infarction (STEMI), unspecified artery [facility]) [I21.3] Patient Active Problem List   Diagnosis Date Noted  . Palliative care by specialist   . DNR (do not resuscitate) discussion   . Encounter for hospice care discussion   . STEMI (ST elevation myocardial infarction) ([place]) 08/13/2018  . End stage chronic kidney disease ([place]) 08/13/2018  . Hyperkalemia 08/13/2018  . Goals of care, counseling/discussion 08/13/2018  . End of life care 08/13/2018  . BPH (benign prostatic hyperplasia) 11/27/2015  . CKD stage 5 secondary to hypertension ([place]) 11/27/2015  . UTI (urinary tract infection) 11/11/2015  . AKI (acute kidney injury) ([place]) 11/11/2015  . Pernicious anemia 07/04/2015  . DVT (deep  venous thrombosis) ([place]) 05/20/2013  . Acute-on-chronic kidney injury ([place]) 05/17/2013  . Essential hypertension, benign 05/17/2013  . Gout 05/17/2013  . Urinary retention 05/17/2013  . Leg  swelling 05/17/2013  . Poor appetite 05/17/2013  . Unspecified constipation 05/17/2013  . Anemia 05/16/2013  . Symptomatic anemia 05/16/2013   PCP:  Patient, No Pcp Per Pharmacy:   [id number] [place], [address] [place] [address] Phone: [phone] Fax: [phone]     Social Determinants of Health (SDOH) Interventions    Readmission Risk Interventions No flowsheet data found.

## 2018-08-15 MED ORDER — BISACODYL 10 MG RE SUPP: 10.0000 mg | Freq: Every day | RECTAL | 0 refills | Status: AC | PRN

## 2018-08-15 MED ORDER — TEMAZEPAM 7.5 MG PO CAPS: 7.5000 mg | ORAL_CAPSULE | Freq: Every evening | ORAL | 0 refills | Status: DC | PRN

## 2018-08-15 MED ORDER — HALOPERIDOL 0.5 MG PO TABS: 0.5000 mg | ORAL_TABLET | ORAL | 0 refills | Status: AC | PRN

## 2018-08-15 MED ORDER — OXYBUTYNIN CHLORIDE 5 MG PO TABS: 2.5000 mg | ORAL_TABLET | Freq: Four times a day (QID) | ORAL | 0 refills | Status: DC | PRN

## 2018-08-15 MED ORDER — ASPIRIN EC 81 MG PO TBEC: 81.0000 mg | DELAYED_RELEASE_TABLET | Freq: Every day | ORAL | 0 refills | Status: AC

## 2018-08-15 MED ORDER — LORAZEPAM 1 MG PO TABS: 1.0000 mg | ORAL_TABLET | ORAL | 0 refills | Status: AC | PRN

## 2018-08-15 MED ORDER — ALUM & MAG HYDROXIDE-SIMETH 200-200-20 MG/5ML PO SUSP: 30.0000 mL | Freq: Four times a day (QID) | ORAL | 0 refills | Status: AC | PRN

## 2018-08-15 MED ORDER — PANTOPRAZOLE SODIUM 40 MG PO TBEC: 40.0000 mg | DELAYED_RELEASE_TABLET | Freq: Every day | ORAL | 0 refills | Status: AC

## 2018-08-15 MED ORDER — GLYCOPYRROLATE 1 MG PO TABS: 1.0000 mg | ORAL_TABLET | ORAL | 0 refills | Status: AC | PRN

## 2018-08-15 NOTE — TOC Progression Note (Addendum)
Transition of Care [facility]) - Progression Note    Patient Details  Name: Caleb Hayes MRN: [id number] Date of Birth: January 22, 1930  [facility] CM/SW Contact  Bartholomew Crews, RN Phone Number: [phone] 08/15/2018, 11:07 AM  Clinical Narrative:    Spoke with patient daughter, Doroteo Bradford, at [phone] to discuss discharge planning. Doroteo Bradford states that she is waiting for call from [employer] about setting home oxygen. Spoke with [employer] to advise that CM had spoken with Doroteo Bradford who is awaiting their call. Spoke with Lattie Haw at [facility] who stated that family meeting with patient in the home is scheduled for tomorrow, Sunday, at 3pm. Patient is good to transition home once oxygen is in place. Doroteo Bradford states that her nephew will provide transportation for patient to return home at discharge. She asks that she be contacted when patient is ready for discharge and she will let her nephew know. Update: Spoke with Doroteo Bradford about patient being ready this afternoon. Spoke with bedside nurse to update on plan.    Expected Discharge Plan: Home w Hospice Care Barriers to Discharge: No Barriers Identified  Expected Discharge Plan and Services Expected Discharge Plan: [place] In-house Referral: NA Discharge Planning Services: CM Consult Post Acute Care Choice: Hospice, Durable Medical Equipment Living arrangements for the past 2 months: Single Family Home                 DME Arranged: Oxygen, Walker rolling, [place] DME Agency: [facility] ([facility] will order DME) Date DME Agency Contacted: 08/14/18 Time DME Agency Contacted: [id number] Representative spoke with at DME Agency: Bevely Palmer with [facility] to place order for DME HH Arranged: RN [facility] Agency: [facility]osp[facility] and [place] Date [place]: 08/14/18 Time [place]: [id number] Representative spoke with at [place]: [place] ([place])  Interventions    Readmission Risk Interventions No flowsheet data found.

## 2018-08-17 NOTE — Discharge Summary (Signed)
Physician Discharge Summary  Reginald Weida Mckenny QJF:[id number] DOB: 08-06-1929 DOA: 08/13/2018  PCP: Patient, No Pcp Per  Admit date: 08/13/2018 Discharge date: 08/15/2018  Admitted From: Home.  Disposition:  Home.   Recommendations for Outpatient Follow-up:  Follow up with hospice MD as recommended.   Discharge Condition:hospice.  CODE STATUS:comfort care. Diet recommendation: Heart Healthy   Brief/Interim Summary: Kingjames Coury Pinnixis a 85 y.o.malewith medical history significant ofchronic kidney disease stage V who was lost to follow-up from nephrology in 2017. He has a history of benign prostatic hypertrophy with chronic indwelling Foley catheter, gout, hypertension who presented to the emergency department with intermittent chest pain and shortness of breath. EKG found a STEMI cardiology was consulted. Cardiac catheterization was considered but given his renal failure and the above other issues medical management has been decided upon. Remains very clear that he does not want to pursue hemodialysis. Dr. Joelyn Oms discussed hospice with the patient and palliative care consulted.   Discharge Diagnoses:  Principal Problem:   STEMI (ST elevation myocardial infarction) ([place]) Active Problems:   End stage chronic kidney disease (HCC)   Hyperkalemia   Goals of care, counseling/discussion   End of life care   Palliative care by specialist   DNR (do not resuscitate) discussion   Encounter for hospice care discussion  STEMI: Pt is not a candidate for cardiac cath as it outweighs the benefits, when he is adamant about refusing HD/ CPR and mechanical ventilation.  Medical management for now.  Palliative care consulted for goals of care and transition to hospice with goals for quality of life.  Patient wanted to go home with hospice and referral made.    ESRD: HD not initiated yet.    Hyperkalemia Improvement in potassium from 6.8 to 5.8.   GERD On protonix  Discharge  Instructions  Discharge Instructions    Diet - low sodium heart healthy   Complete by:  As directed    Discharge instructions   Complete by:  As directed    Please follow up with Hospice MD as recommended.     Allergies as of 08/15/2018   No Known Allergies     Medication List    STOP taking these medications   lisinopril 2.5 MG tablet Commonly known as:  Zestril     TAKE these medications   alum & mag hydroxide-simeth 200-200-20 MG/5ML suspension Commonly known as:  MAALOX/MYLANTA Take 30 mLs by mouth every 6 (six) hours as needed for indigestion or heartburn (dyspepsia).   aspirin EC 81 MG tablet Take 1 tablet (81 mg total) by mouth daily for 30 days.   B-12 Compliance Injection 1000 MCG/ML Kit Generic drug:  Cyanocobalamin Inject 1,000 mcg as directed every 30 (thirty) days. Notes to patient:  Take per previous schedule   bisacodyl 10 MG suppository Commonly known as:  DULCOLAX Place 1 suppository (10 mg total) rectally daily as needed for moderate constipation.   glycopyrrolate 1 MG tablet Commonly known as:  ROBINUL Take 1 tablet (1 mg total) by mouth every 4 (four) hours as needed (excessive secretions).   haloperidol 0.5 MG tablet Commonly known as:  HALDOL Take 1 tablet (0.5 mg total) by mouth every 4 (four) hours as needed for agitation (or delirium).   LORazepam 1 MG tablet Commonly known as:  ATIVAN Take 1 tablet (1 mg total) by mouth every 4 (four) hours as needed for anxiety.   oxybutynin 5 MG tablet Commonly known as:  DITROPAN Take 0.5 tablets (2.5 mg total)  by mouth 4 (four) times daily as needed for bladder spasms.   pantoprazole 40 MG tablet Commonly known as:  PROTONIX Take 1 tablet (40 mg total) by mouth daily.   temazepam 7.5 MG capsule Commonly known as:  RESTORIL Take 1 capsule (7.5 mg total) by mouth at bedtime as needed for sleep.      Follow-up Information    [facility].   Specialty:  PALLIATIVE CARE Why:   Registered Nurse-office to call to establish time for a visit.  Contact information: [place] [phone]         No Known Allergies  Consultations:  Palliative care  Nephrology  Cardiology.    Procedures/Studies: Dg Chest Port 1 View  Result Date: 08/13/2018 CLINICAL DATA:  85 year old with acute chest pain. EXAM: PORTABLE CHEST 1 VIEW COMPARISON:  05/16/2013. FINDINGS: Cardiac silhouette upper normal in size for technique, unchanged. Suboptimal inspiration. Calcified granuloma in the LEFT mid lung, unchanged. Lungs otherwise clear. Pulmonary vascularity normal. Bronchovascular markings normal. No visible pleural effusions. IMPRESSION: Suboptimal inspiration. No acute cardiopulmonary disease. Electronically Signed   By: Evangeline Dakin M.D.   On: 08/13/2018 16:49        Subjective: No new complaints.   Discharge Exam: Vitals:   08/14/18 0450 08/15/18 0430  BP: 138/62 (!) 141/81  Pulse: 87 84  Resp: 18 16  Temp: 99.1 F (37.3 C) 98.2 F (36.8 C)  SpO2: 97% 100%   Vitals:   08/13/18 1815 08/13/18 1848 08/14/18 0450 08/15/18 0430  BP: (!) 141/79 (!) 156/79 138/62 (!) 141/81  Pulse: 88 85 87 84  Resp: 19 18 18 16   Temp:  99.3 F (37.4 C) 99.1 F (37.3 C) 98.2 F (36.8 C)  TempSrc:  Oral Oral Oral  SpO2: 99% 99% 97% 100%  Weight:      Height:        General: Pt is alert, awake, not in acute distress Cardiovascular: RRR, S1/S2 +, no rubs, no gallops Respiratory: CTA bilaterally, no wheezing, no rhonchi Abdominal: Soft, NT, ND, bowel sounds + Extremities: no edema, no cyanosis    The results of significant diagnostics from this hospitalization (including imaging, microbiology, ancillary and laboratory) are listed below for reference.     Microbiology: Recent Results (from the past 240 hour(s))  SARS Coronavirus 2 (CEPHEID - Performed in [place] hospital lab), Hosp Order     Status: None   Collection Time:  08/13/18  3:20 PM  Result Value Ref Range Status   SARS Coronavirus 2 NEGATIVE NEGATIVE Final    Comment: (NOTE) If result is NEGATIVE SARS-CoV-2 target nucleic acids are NOT DETECTED. The SARS-CoV-2 RNA is generally detectable in upper and lower  respiratory specimens during the acute phase of infection. The lowest  concentration of SARS-CoV-2 viral copies this assay can detect is 250  copies / mL. A negative result does not preclude SARS-CoV-2 infection  and should not be used as the sole basis for treatment or other  patient management decisions.  A negative result may occur with  improper specimen collection / handling, submission of specimen other  than nasopharyngeal swab, presence of viral mutation(s) within the  areas targeted by this assay, and inadequate number of viral copies  (<250 copies / mL). A negative result must be combined with clinical  observations, patient history, and epidemiological information. If result is POSITIVE SARS-CoV-2 target nucleic acids are DETECTED. The SARS-CoV-2 RNA is generally detectable in upper and lower  respiratory specimens dur ing the acute phase of infection.  Positive  results are indicative of active infection with SARS-CoV-2.  Clinical  correlation with patient history and other diagnostic information is  necessary to determine patient infection status.  Positive results do  not rule out bacterial infection or co-infection with other viruses. If result is PRESUMPTIVE POSTIVE SARS-CoV-2 nucleic acids MAY BE PRESENT.   A presumptive positive result was obtained on the submitted specimen  and confirmed on repeat testing.  While 2019 novel coronavirus  (SARS-CoV-2) nucleic acids may be present in the submitted sample  additional confirmatory testing may be necessary for epidemiological  and / or clinical management purposes  to differentiate between  SARS-CoV-2 and other Sarbecovirus currently known to infect humans.  If clinically  indicated additional testing with an alternate test  methodology [phone]) is advised. The SARS-CoV-2 RNA is generally  detectable in upper and lower respiratory sp ecimens during the acute  phase of infection. The expected result is Negative. Fact Sheet for Patients:  StrictlyIdeas.no Fact Sheet for Healthcare Providers: BankingDealers.co.za This test is not yet approved or cleared by the [place] FDA and has been authorized for detection and/or diagnosis of SARS-CoV-2 by FDA under an Emergency Use Authorization (EUA).  This EUA will remain in effect (meaning this test can be used) for the duration of the COVID-19 declaration under Section 564(b)(1) of the Act, 21 U.S.C. section 360bbb-3(b)(1), unless the authorization is terminated or revoked sooner. Performed at [facility], [place] [address], [place], [place] [address]      Labs: BNP (last 3 results) No results for input(s): BNP in the last 8760 hours. Basic Metabolic Panel: Recent Labs  Lab 08/13/18 1533 08/13/18 1541 08/13/18 1945  NA 132* 133* 132*  K 6.8* 6.3* 5.8*  CL 106 107 106  CO2 17*  --  16*  GLUCOSE 124* 123* 95  BUN 69* 64* 65*  CREATININE 9.10* 10.60* 9.59*  CALCIUM 8.7*  --  8.7*   Liver Function Tests: Recent Labs  Lab 08/13/18 1533  AST 37  ALT 19  ALKPHOS 104  BILITOT 0.8  PROT 7.9  ALBUMIN 3.4*   No results for input(s): LIPASE, AMYLASE in the last 168 hours. No results for input(s): AMMONIA in the last 168 hours. CBC: Recent Labs  Lab 08/13/18 1533 08/13/18 1541 08/14/18 0438  WBC 11.1*  --  10.4  NEUTROABS 8.5*  --   --   HGB 10.3* 10.9* 9.3*  HCT 30.1* 32.0* 26.9*  MCV 88.3  --  84.1  PLT 291  --  256   Cardiac Enzymes: Recent Labs  Lab 08/13/18 1533  TROPONINI 23.26*   BNP: Invalid input(s): POCBNP CBG: No results for input(s): GLUCAP in the last 168 hours. D-Dimer No results for input(s): DDIMER  in the last 72 hours. Hgb A1c No results for input(s): HGBA1C in the last 72 hours. Lipid Profile No results for input(s): CHOL, HDL, LDLCALC, TRIG, CHOLHDL, LDLDIRECT in the last 72 hours. Thyroid function studies No results for input(s): TSH, T4TOTAL, T3FREE, THYROIDAB in the last 72 hours.  Invalid input(s): FREET3 Anemia work up No results for input(s): VITAMINB12, FOLATE, FERRITIN, TIBC, IRON, RETICCTPCT in the last 72 hours. Urinalysis    Component Value Date/Time   COLORURINE RED (A) 11/11/2015 1310   APPEARANCEUR TURBID (A) 11/11/2015 1310   LABSPEC 1.028 11/11/2015 1310   PHURINE 7.5 11/11/2015 1310   GLUCOSEU 100 (A) 11/11/2015 1310   HGBUR LARGE (A) 11/11/2015  [place] 11/11/2015 1310   KETONESUR NEGATIVE 11/11/2015 1310   PROTEINUR >300 (A) 11/11/2015 1310   UROBILINOGEN 0.2 08/05/2014 1446   NITRITE NEGATIVE 11/11/2015 1310   LEUKOCYTESUR MODERATE (A) 11/11/2015 1310   Sepsis Labs Invalid input(s): PROCALCITONIN,  WBC,  LACTICIDVEN Microbiology Recent Results (from the past 240 hour(s))  SARS Coronavirus 2 (CEPHEID - Performed in [place] hospital lab), Hosp Order     Status: None   Collection Time: 08/13/18  3:20 PM  Result Value Ref Range Status   SARS Coronavirus 2 NEGATIVE NEGATIVE Final    Comment: (NOTE) If result is NEGATIVE SARS-CoV-2 target nucleic acids are NOT DETECTED. The SARS-CoV-2 RNA is generally detectable in upper and lower  respiratory specimens during the acute phase of infection. The lowest  concentration of SARS-CoV-2 viral copies this assay can detect is 250  copies / mL. A negative result does not preclude SARS-CoV-2 infection  and should not be used as the sole basis for treatment or other  patient management decisions.  A negative result may occur with  improper specimen collection / handling, submission of specimen other  than nasopharyngeal swab, presence of viral mutation(s) within the  areas targeted by  this assay, and inadequate number of viral copies  (<250 copies / mL). A negative result must be combined with clinical  observations, patient history, and epidemiological information. If result is POSITIVE SARS-CoV-2 target nucleic acids are DETECTED. The SARS-CoV-2 RNA is generally detectable in upper and lower  respiratory specimens dur ing the acute phase of infection.  Positive  results are indicative of active infection with SARS-CoV-2.  Clinical  correlation with patient history and other diagnostic information is  necessary to determine patient infection status.  Positive results do  not rule out bacterial infection or co-infection with other viruses. If result is PRESUMPTIVE POSTIVE SARS-CoV-2 nucleic acids MAY BE PRESENT.   A presumptive positive result was obtained on the submitted specimen  and confirmed on repeat testing.  While 2019 novel coronavirus  (SARS-CoV-2) nucleic acids may be present in the submitted sample  additional confirmatory testing may be necessary for epidemiological  and / or clinical management purposes  to differentiate between  SARS-CoV-2 and other Sarbecovirus currently known to infect humans.  If clinically indicated additional testing with an alternate test  methodology [id number]) is advised. The SARS-CoV-2 RNA is generally  detectable in upper and lower respiratory sp ecimens during the acute  phase of infection. The expected result is Negative. Fact Sheet for Patients:  StrictlyIdeas.no Fact Sheet for Healthcare Providers: BankingDealers.co.za This test is not yet approved or cleared by the [place] FDA and has been authorized for detection and/or diagnosis of SARS-CoV-2 by FDA under an Emergency Use Authorization (EUA).  This EUA will remain in effect (meaning this test can be used) for the duration of the COVID-19 declaration under Section 564(b)(1) of the Act, 21 U.S.C. section  360bbb-3(b)(1), unless the authorization is terminated or revoked sooner. Performed at [facility], [place] [address] [place], [place] [address]      Time coordinating discharge: 34 minutes  SIGNED:   Hosie Poisson, MD  [facility] 08/17/2018, 6:17 PM Pager   If 7PM-7AM, please contact night-coverage [email or web address] Password [email or web address]

## 2018-08-18 ENCOUNTER — Other Ambulatory Visit: Payer: Self-pay | Admitting: Oncology

## 2018-08-18 NOTE — Progress Notes (Signed)
I called the patient but actually got his daughter Doroteo Bradford who lives in [place] (that is whose number we have in the chart).  The patient has had 1 or possibly 2 heart attacks and is in frank end-stage renal disease, refusing hemodialysis.  Hospice has been appropriately contacted.  The hospice nurses will be visiting Monday Wednesday Friday.  I gave Doroteo Bradford my beeper number so she could get the hospice nurse to beat me tomorrow while she is in the home so we can discuss medications and any other changes that need to be made.  Of course the patient is not to be resuscitated in case of a terminal event.

## 2018-08-21 ENCOUNTER — Telehealth: Payer: Self-pay

## 2018-08-21 NOTE — Telephone Encounter (Signed)
Hospice nurse Melodye Ped, RN requesting consultation via phone with MD to discuss medications and discontinuation of certain mediations.   Contact number [phone]

## 2018-08-27 ENCOUNTER — Telehealth: Payer: Self-pay

## 2018-08-27 NOTE — Telephone Encounter (Signed)
Spoke with Cyril Mourning, RN for hospice, and gave updated med list. All injection appts have been cancelled d/t pt now under hospice care.

## 2018-09-01 ENCOUNTER — Other Ambulatory Visit: Payer: Self-pay | Admitting: *Deleted

## 2018-09-01 ENCOUNTER — Inpatient Hospital Stay: Payer: Medicare Other

## 2018-09-01 MED ORDER — TEMAZEPAM 7.5 MG PO CAPS: 7.5000 mg | ORAL_CAPSULE | Freq: Every evening | ORAL | 0 refills | Status: DC | PRN

## 2018-09-01 MED ORDER — TEMAZEPAM 7.5 MG PO CAPS: 7.5000 mg | ORAL_CAPSULE | Freq: Every evening | ORAL | 3 refills | Status: AC | PRN

## 2018-09-04 DIAGNOSIS — R338 Other retention of urine: Secondary | ICD-10-CM | POA: Diagnosis not present

## 2018-09-29 ENCOUNTER — Ambulatory Visit: Payer: Medicare Other

## 2018-10-05 ENCOUNTER — Other Ambulatory Visit: Payer: Self-pay | Admitting: Oncology

## 2018-10-10 DEATH — deceased

## 2018-10-27 ENCOUNTER — Ambulatory Visit: Payer: Medicare Other

## 2018-11-24 ENCOUNTER — Ambulatory Visit: Payer: Medicare Other

## 2018-12-22 ENCOUNTER — Ambulatory Visit: Payer: Medicare Other

## 2019-01-26 ENCOUNTER — Ambulatory Visit: Payer: Medicare Other

## 2019-02-23 ENCOUNTER — Ambulatory Visit: Payer: Medicare Other

## 2019-03-16 ENCOUNTER — Ambulatory Visit: Payer: Medicare Other | Admitting: Oncology

## 2019-03-16 ENCOUNTER — Ambulatory Visit: Payer: Self-pay

## 2019-03-16 ENCOUNTER — Other Ambulatory Visit: Payer: Medicare Other

## 2019-03-16 ENCOUNTER — Ambulatory Visit: Payer: Medicare Other

## 2019-07-07 IMAGING — DX PORTABLE CHEST - 1 VIEW
1 series · 1 of 1 positions shown · non-contrast
Comparison: 05/16/2013.

CLINICAL DATA: 88-year-old with acute chest pain.

EXAM:
PORTABLE CHEST 1 VIEW

[chest]
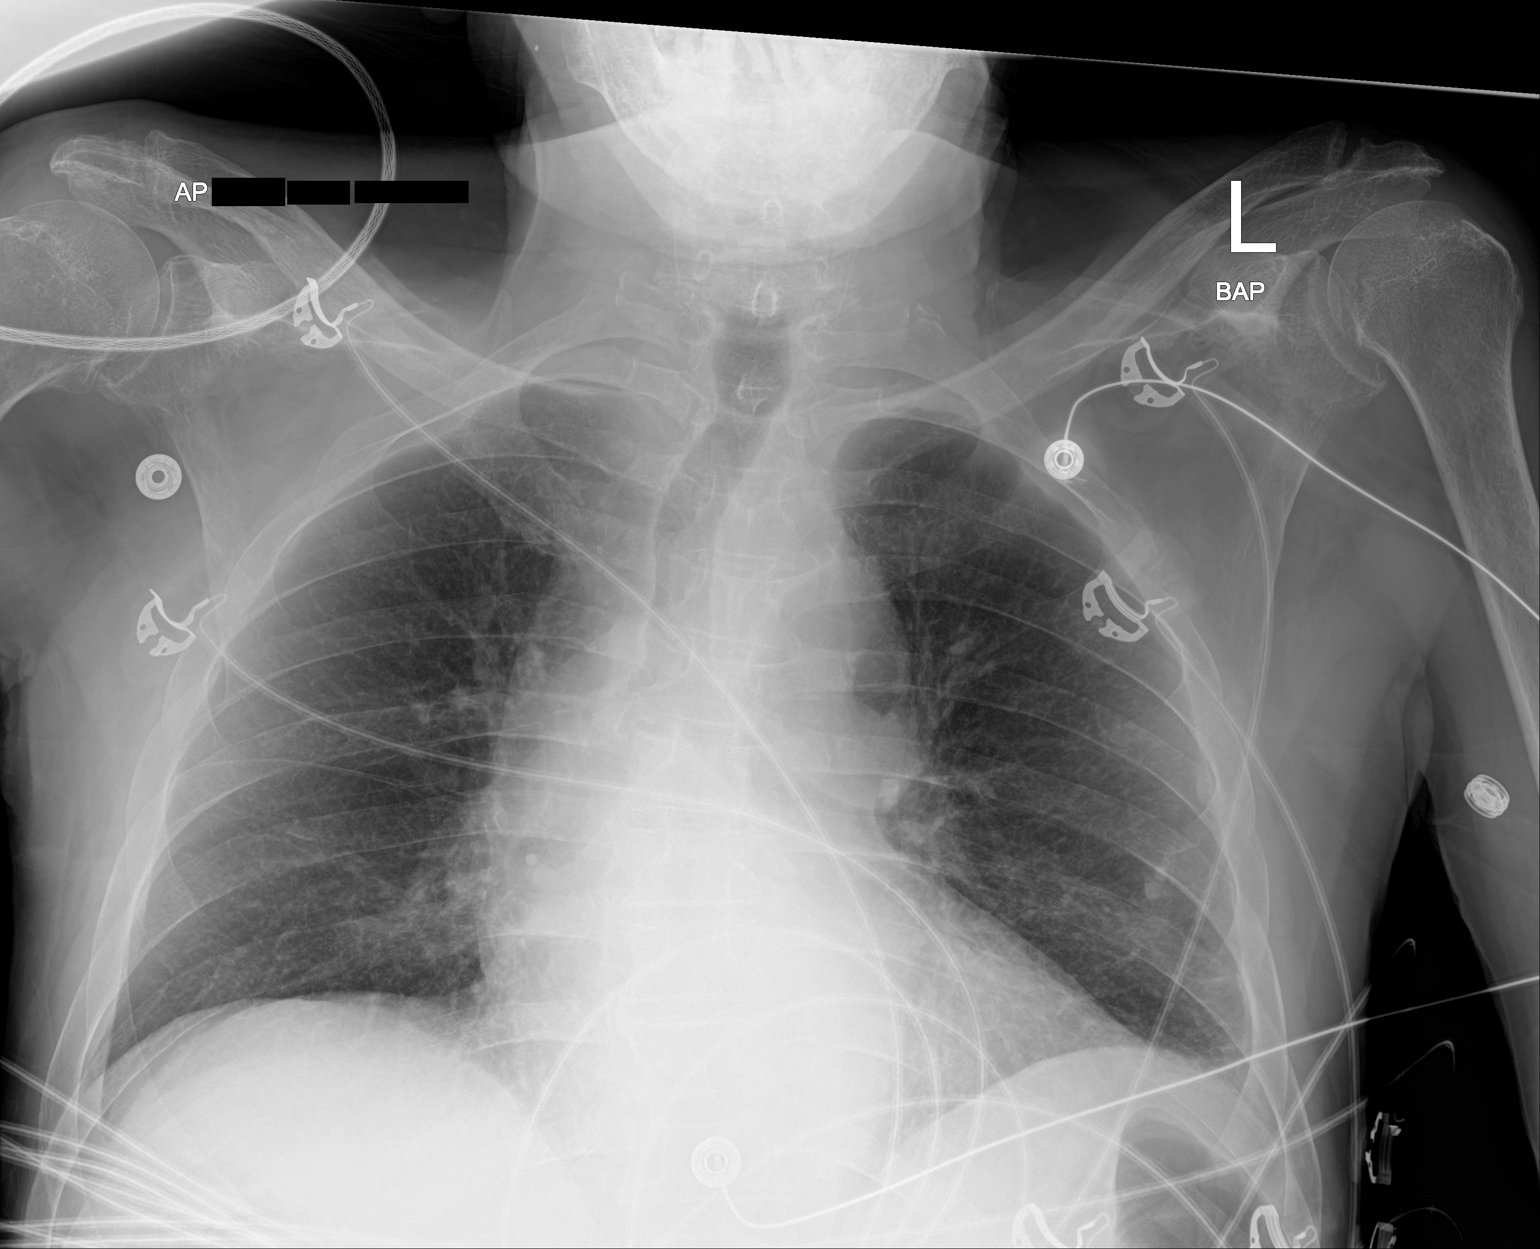

[1 of 1 positions shown; findings below may reference images not displayed]

FINDINGS: Cardiac silhouette upper normal in size for technique, unchanged.
Suboptimal inspiration. Calcified granuloma in the LEFT mid lung,
unchanged. Lungs otherwise clear. Pulmonary vascularity normal.
Bronchovascular markings normal. No visible pleural effusions.
IMPRESSION: Suboptimal inspiration. No acute cardiopulmonary disease.
